# Patient Record
Sex: Female | Born: 1970 | Race: White | Hispanic: No | Smoking: Never smoker
Health system: Southern US, Community
[De-identification: ages and names within clinical notes are randomized; demographics above are authoritative.]

## PROBLEM LIST (undated history)

## (undated) ENCOUNTER — Ambulatory Visit: Discharge status: Absent without leave | Payer: Medicaid Other

## (undated) DIAGNOSIS — F319 Bipolar disorder, unspecified: Secondary | ICD-10-CM

## (undated) DIAGNOSIS — R87629 Unspecified abnormal cytological findings in specimens from vagina: Secondary | ICD-10-CM

## (undated) DIAGNOSIS — F450 Somatization disorder: Secondary | ICD-10-CM

## (undated) DIAGNOSIS — I10 Essential (primary) hypertension: Secondary | ICD-10-CM

## (undated) DIAGNOSIS — K769 Liver disease, unspecified: Secondary | ICD-10-CM

## (undated) DIAGNOSIS — F419 Anxiety disorder, unspecified: Secondary | ICD-10-CM

## (undated) DIAGNOSIS — F22 Delusional disorders: Secondary | ICD-10-CM

## (undated) DIAGNOSIS — N83209 Unspecified ovarian cyst, unspecified side: Secondary | ICD-10-CM

## (undated) DIAGNOSIS — R002 Palpitations: Secondary | ICD-10-CM

## (undated) HISTORY — DX: Bipolar disorder, unspecified: F31.9

## (undated) HISTORY — DX: Palpitations: R00.2

## (undated) HISTORY — DX: Anxiety disorder, unspecified: F41.9

## (undated) HISTORY — DX: Liver disease, unspecified: K76.9

## (undated) HISTORY — PX: UPPER GI ENDOSCOPY: SHX6162

## (undated) HISTORY — DX: Unspecified abnormal cytological findings in specimens from vagina: R87.629

---

## 2001-04-22 ENCOUNTER — Emergency Department (HOSPITAL_COMMUNITY): Admission: EM | Admit: 2001-04-22 | Discharge: 2001-04-22 | Payer: Self-pay | Admitting: *Deleted

## 2001-04-22 ENCOUNTER — Encounter: Payer: Self-pay | Admitting: *Deleted

## 2004-01-03 ENCOUNTER — Emergency Department (HOSPITAL_COMMUNITY): Admission: EM | Admit: 2004-01-03 | Discharge: 2004-01-03 | Payer: Self-pay | Admitting: Emergency Medicine

## 2005-06-04 ENCOUNTER — Emergency Department (HOSPITAL_COMMUNITY): Admission: EM | Admit: 2005-06-04 | Discharge: 2005-06-04 | Payer: Self-pay | Admitting: Emergency Medicine

## 2006-04-22 ENCOUNTER — Emergency Department (HOSPITAL_COMMUNITY): Admission: EM | Admit: 2006-04-22 | Discharge: 2006-04-22 | Payer: Self-pay | Admitting: Emergency Medicine

## 2006-05-30 ENCOUNTER — Emergency Department (HOSPITAL_COMMUNITY): Admission: EM | Admit: 2006-05-30 | Discharge: 2006-05-30 | Payer: Self-pay | Admitting: Emergency Medicine

## 2006-06-01 ENCOUNTER — Emergency Department (HOSPITAL_COMMUNITY): Admission: EM | Admit: 2006-06-01 | Discharge: 2006-06-01 | Payer: Self-pay | Admitting: Emergency Medicine

## 2006-07-02 ENCOUNTER — Ambulatory Visit (HOSPITAL_COMMUNITY): Admission: RE | Admit: 2006-07-02 | Discharge: 2006-07-02 | Payer: Self-pay | Admitting: Pulmonary Disease

## 2006-11-05 ENCOUNTER — Ambulatory Visit (HOSPITAL_COMMUNITY): Admission: RE | Admit: 2006-11-05 | Discharge: 2006-11-05 | Payer: Self-pay | Admitting: Pulmonary Disease

## 2008-03-01 ENCOUNTER — Other Ambulatory Visit: Admission: RE | Admit: 2008-03-01 | Discharge: 2008-03-01 | Payer: Self-pay | Admitting: Obstetrics and Gynecology

## 2008-08-03 ENCOUNTER — Ambulatory Visit (HOSPITAL_COMMUNITY): Admission: RE | Admit: 2008-08-03 | Discharge: 2008-08-03 | Payer: Self-pay | Admitting: Pulmonary Disease

## 2008-10-12 ENCOUNTER — Ambulatory Visit (HOSPITAL_COMMUNITY): Admission: RE | Admit: 2008-10-12 | Discharge: 2008-10-12 | Payer: Self-pay | Admitting: Obstetrics and Gynecology

## 2010-07-15 ENCOUNTER — Ambulatory Visit (HOSPITAL_COMMUNITY): Admission: RE | Admit: 2010-07-15 | Discharge: 2010-07-15 | Payer: Self-pay | Admitting: Obstetrics and Gynecology

## 2010-11-03 ENCOUNTER — Encounter: Payer: Self-pay | Admitting: Orthopaedic Surgery

## 2011-10-02 ENCOUNTER — Emergency Department (HOSPITAL_COMMUNITY): Payer: Medicaid Other

## 2011-10-02 ENCOUNTER — Encounter: Payer: Self-pay | Admitting: Emergency Medicine

## 2011-10-02 ENCOUNTER — Emergency Department (HOSPITAL_COMMUNITY)
Admission: EM | Admit: 2011-10-02 | Discharge: 2011-10-02 | Disposition: A | Discharge status: Home / Self Care | Payer: Medicaid Other | Attending: Emergency Medicine | Admitting: Emergency Medicine

## 2011-10-02 DIAGNOSIS — S1093XA Contusion of unspecified part of neck, initial encounter: Secondary | ICD-10-CM | POA: Insufficient documentation

## 2011-10-02 DIAGNOSIS — Y92009 Unspecified place in unspecified non-institutional (private) residence as the place of occurrence of the external cause: Secondary | ICD-10-CM | POA: Insufficient documentation

## 2011-10-02 DIAGNOSIS — N390 Urinary tract infection, site not specified: Secondary | ICD-10-CM | POA: Insufficient documentation

## 2011-10-02 DIAGNOSIS — R609 Edema, unspecified: Secondary | ICD-10-CM | POA: Insufficient documentation

## 2011-10-02 DIAGNOSIS — R600 Localized edema: Secondary | ICD-10-CM

## 2011-10-02 DIAGNOSIS — S0003XA Contusion of scalp, initial encounter: Secondary | ICD-10-CM | POA: Insufficient documentation

## 2011-10-02 DIAGNOSIS — W540XXA Bitten by dog, initial encounter: Secondary | ICD-10-CM | POA: Insufficient documentation

## 2011-10-02 DIAGNOSIS — Z8679 Personal history of other diseases of the circulatory system: Secondary | ICD-10-CM | POA: Insufficient documentation

## 2011-10-02 HISTORY — DX: Unspecified ovarian cyst, unspecified side: N83.209

## 2011-10-02 LAB — URINALYSIS, ROUTINE W REFLEX MICROSCOPIC
Ketones, ur: 15 mg/dL — AB
Nitrite: NEGATIVE
pH: 6 (ref 5.0–8.0)

## 2011-10-02 LAB — BASIC METABOLIC PANEL
Calcium: 9.2 mg/dL (ref 8.4–10.5)
Chloride: 104 mEq/L (ref 96–112)
Creatinine, Ser: 0.64 mg/dL (ref 0.50–1.10)
GFR calc Af Amer: >90 mL/min (ref >90)
Sodium: 140 mEq/L (ref 135–145)

## 2011-10-02 LAB — CBC
MCHC: 32.1 g/dL (ref 30.0–36.0)
RDW: 14.9 % (ref 11.5–15.5)

## 2011-10-02 LAB — URINE MICROSCOPIC-ADD ON

## 2011-10-02 MED ORDER — NITROFURANTOIN MONOHYD MACRO 100 MG PO CAPS
100.0000 mg | ORAL_CAPSULE | Freq: Once | ORAL | Status: DC
  Filled 2011-10-02: qty 1

## 2011-10-02 MED ORDER — NITROFURANTOIN MONOHYD MACRO 100 MG PO CAPS: 100.0000 mg | ORAL_CAPSULE | Freq: Two times a day (BID) | ORAL | Status: AC

## 2011-10-02 MED ORDER — NITROFURANTOIN MACROCRYSTAL 100 MG PO CAPS
ORAL_CAPSULE | ORAL | Status: AC
  Administered 2011-10-02: 100 mg
  Filled 2011-10-02: qty 1

## 2011-10-02 NOTE — ED Notes (Addendum)
Patient reports dog bite to left side of face yesterday. Small scabbed area noted. Per patient headache, warm feeling, and tremors afterwards with swelling to left leg.
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
## 2011-10-09 ENCOUNTER — Other Ambulatory Visit (HOSPITAL_COMMUNITY): Payer: Self-pay | Admitting: Pulmonary Disease

## 2011-10-09 ENCOUNTER — Ambulatory Visit (HOSPITAL_COMMUNITY)
Admission: RE | Admit: 2011-10-09 | Discharge: 2011-10-09 | Disposition: A | Discharge status: Home / Self Care | Payer: Medicaid Other | Source: Ambulatory Visit | Attending: Pulmonary Disease | Admitting: Pulmonary Disease

## 2011-10-09 DIAGNOSIS — M25569 Pain in unspecified knee: Secondary | ICD-10-CM | POA: Insufficient documentation

## 2011-10-09 DIAGNOSIS — M79606 Pain in leg, unspecified: Secondary | ICD-10-CM

## 2011-10-17 ENCOUNTER — Ambulatory Visit (HOSPITAL_COMMUNITY)
Admission: RE | Admit: 2011-10-17 | Discharge: 2011-10-17 | Disposition: A | Discharge status: Home / Self Care | Payer: Medicaid Other | Source: Ambulatory Visit | Attending: Pulmonary Disease | Admitting: Pulmonary Disease

## 2011-10-17 ENCOUNTER — Other Ambulatory Visit (HOSPITAL_COMMUNITY): Payer: Self-pay | Admitting: Pulmonary Disease

## 2011-10-17 DIAGNOSIS — M7989 Other specified soft tissue disorders: Secondary | ICD-10-CM | POA: Insufficient documentation

## 2011-10-17 DIAGNOSIS — M79609 Pain in unspecified limb: Secondary | ICD-10-CM | POA: Insufficient documentation

## 2011-10-17 DIAGNOSIS — M79605 Pain in left leg: Secondary | ICD-10-CM

## 2011-10-26 ENCOUNTER — Emergency Department (HOSPITAL_COMMUNITY): Payer: Medicaid Other

## 2011-10-26 ENCOUNTER — Emergency Department (HOSPITAL_COMMUNITY)
Admission: EM | Admit: 2011-10-26 | Discharge: 2011-10-26 | Disposition: A | Discharge status: Home / Self Care | Payer: Medicaid Other | Attending: Emergency Medicine | Admitting: Emergency Medicine

## 2011-10-26 ENCOUNTER — Encounter (HOSPITAL_COMMUNITY): Payer: Self-pay | Admitting: *Deleted

## 2011-10-26 DIAGNOSIS — N390 Urinary tract infection, site not specified: Secondary | ICD-10-CM | POA: Insufficient documentation

## 2011-10-26 DIAGNOSIS — R220 Localized swelling, mass and lump, head: Secondary | ICD-10-CM | POA: Insufficient documentation

## 2011-10-26 DIAGNOSIS — R51 Headache: Secondary | ICD-10-CM | POA: Insufficient documentation

## 2011-10-26 DIAGNOSIS — H9209 Otalgia, unspecified ear: Secondary | ICD-10-CM | POA: Insufficient documentation

## 2011-10-26 DIAGNOSIS — Z8673 Personal history of transient ischemic attack (TIA), and cerebral infarction without residual deficits: Secondary | ICD-10-CM | POA: Insufficient documentation

## 2011-10-26 LAB — URINALYSIS, ROUTINE W REFLEX MICROSCOPIC
Glucose, UA: NEGATIVE mg/dL
Ketones, ur: 40 mg/dL — AB
Protein, ur: NEGATIVE mg/dL

## 2011-10-26 LAB — SEDIMENTATION RATE: Sed Rate: 36 mm/hr — ABNORMAL HIGH (ref 0–22)

## 2011-10-26 LAB — CBC
MCH: 25.6 pg — ABNORMAL LOW (ref 26.0–34.0)
MCV: 79.9 fL (ref 78.0–100.0)
Platelets: 256 10*3/uL (ref 150–400)
RDW: 14.9 % (ref 11.5–15.5)

## 2011-10-26 LAB — BASIC METABOLIC PANEL
Calcium: 9.6 mg/dL (ref 8.4–10.5)
GFR calc non Af Amer: >90 mL/min (ref >90)
Glucose, Bld: 104 mg/dL — ABNORMAL HIGH (ref 70–99)
Sodium: 137 mEq/L (ref 135–145)

## 2011-10-26 LAB — DIFFERENTIAL
Basophils Absolute: 0 10*3/uL (ref 0.0–0.1)
Eosinophils Absolute: 0 10*3/uL (ref 0.0–0.7)
Eosinophils Relative: 0 % (ref 0–5)

## 2011-10-26 LAB — URINE MICROSCOPIC-ADD ON

## 2011-10-26 MED ORDER — IBUPROFEN 800 MG PO TABS
800.0000 mg | ORAL_TABLET | Freq: Once | ORAL | Status: AC
  Administered 2011-10-26: 800 mg via ORAL
  Filled 2011-10-26: qty 1

## 2011-10-26 MED ORDER — LORAZEPAM 1 MG PO TABS
1.0000 mg | ORAL_TABLET | Freq: Once | ORAL | Status: AC
  Administered 2011-10-26: 1 mg via ORAL
  Filled 2011-10-26: qty 1

## 2011-10-26 MED ORDER — CEPHALEXIN 500 MG PO CAPS: 500.0000 mg | ORAL_CAPSULE | Freq: Four times a day (QID) | ORAL | Status: AC

## 2011-10-26 NOTE — ED Notes (Addendum)
Pt c/o swelling to right side of face since she got bitten by a dog on dec. 18, pt c/o bruising from taking Macrobid, pt states that she has pain to right side of temple area and hears a swishing sound when she gets up, pt states that the swelling and pain she has had since the dog bite, the swishing is a new symptom

## 2011-10-26 NOTE — ED Provider Notes (Signed)
History  This chart was scribed for Glynn Octave, MD by Bennett Scrape. This patient was seen in room APA08/APA08 and the patient's care was started at 5:58PM.  CSN: [id number]  Arrival date & time 10/26/11  1624   First MD Initiated Contact with Patient 10/26/11 1743      Chief Complaint  Patient presents with  . Headache   The history is provided by the patient. No language interpreter was used.    Kayla [name] is a 41 y.o. female who presents to the Emergency Department complaining of 2 weeks of gradual onset, gradually worsening, constant right sided HA with associated right-sided facial swelling. She also c/o 2 days of occasional otalgia of the right ear describes as a swishing sound. She denies any modifying factors. She has not taken any medication at home to improve symptoms. She denies double vision, nausea, vomiting and chest pain as associated symptoms. She states that she was bit by dog in December and feels that sh might be having some delayed reaction to the bite.  She has a h/o panic attacks and bilateral leg edema.  She denies smoking and alcohol use.  Pt's PCP is Dr. Juanetta Gosling.  Past Medical History  Diagnosis Date  . Ovarian cyst   . Polyp cervix   . Panic attack   . TIA (transient ischemic attack)   . Edema     History reviewed. No pertinent past surgical history.  Family History  Problem Relation Age of Onset  . Cancer Father   . Heart failure Other   . Cancer Other     History  Substance Use Topics  . Smoking status: Never Smoker   . Smokeless tobacco: Never Used  . Alcohol Use: No    OB History    Grav Para Term Preterm Abortions TAB SAB Ect Mult Living   3 3 3       3       Review of Systems  A complete 10 system review of systems was obtained and is otherwise negative except as noted in the HPI.   Allergies  Review of patient's allergies indicates no known allergies.  Home Medications   Current Outpatient Rx  Name Route Sig  Dispense Refill  . FUROSEMIDE 40 MG PO TABS Oral Take 40 mg by mouth daily.      . ADULT MULTIVITAMIN W/MINERALS CH Oral Take 1 tablet by mouth daily.      . CEPHALEXIN 500 MG PO CAPS Oral Take 1 capsule (500 mg total) by mouth 4 (four) times daily. 40 capsule 0    Triage Vitals: BP 144/100  Pulse 116  Temp(Src) 98.9 F (37.2 C) (Oral)  Resp 20  Ht 5\' 4"  (1.626 m)  Wt 210 lb (95.255 kg)  BMI 36.05 kg/m2  SpO2 99%  LMP 10/13/2011  Physical Exam  Nursing note and vitals reviewed. Constitutional: She is oriented to person, place, and time. She appears well-developed and well-nourished.  HENT:  Head: Normocephalic and atraumatic.       No visible swelling, no artery tenderness  Eyes: Conjunctivae and EOM are normal.  Neck: Normal range of motion. Neck supple.  Cardiovascular: Normal rate, regular rhythm and normal heart sounds.   Pulmonary/Chest: Effort normal and breath sounds normal. No respiratory distress.  Abdominal: Soft. There is no tenderness.  Musculoskeletal: She exhibits edema (symmetric leg swelling).       5 strength throughtout  Neurological: She is alert and oriented to person, place, and time. No cranial  nerve deficit.  Skin: Skin is warm and dry. No rash noted.  Psychiatric: She has a normal mood and affect. Her behavior is normal.    ED Course  Procedures (including critical care time)  DIAGNOSTIC STUDIES: Oxygen Saturation is 99% on room air, normal by my interpretation.    COORDINATION OF CARE: 6:04PM-Discussed treatment plan with pt at bedside and pt agreed to plan. 9:18PM-Discussed negative CT results with pt and pt acknowledged. Will prescribe antibiotics for the urine infections. Advised pt to follow-up with PCP within the next week.  Labs Reviewed  CBC - Abnormal; Notable for the following:    Hemoglobin 11.7 (*)    MCH 25.6 (*)    All other components within normal limits  BASIC METABOLIC PANEL - Abnormal; Notable for the following:    Glucose,  Bld 104 (*)    All other components within normal limits  SEDIMENTATION RATE - Abnormal; Notable for the following:    Sed Rate 36 (*)    All other components within normal limits  URINALYSIS, ROUTINE W REFLEX MICROSCOPIC - Abnormal; Notable for the following:    APPearance CLOUDY (*)    Specific Gravity, Urine >1.030 (*)    Bilirubin Urine SMALL (*)    Ketones, ur 40 (*)    Leukocytes, UA SMALL (*)    All other components within normal limits  URINE MICROSCOPIC-ADD ON - Abnormal; Notable for the following:    Squamous Epithelial / LPF MANY (*)    Bacteria, UA MANY (*)    All other components within normal limits  DIFFERENTIAL  PREGNANCY, URINE   Ct Head Wo Contrast  10/26/2011  *RADIOLOGY REPORT*  Clinical Data:  Headache  CT HEAD WITHOUT CONTRAST  Technique:  Contiguous axial images were obtained from the base of the skull through the vertex without contrast  Comparison:  07/02/2006  Findings:  The brain has a normal appearance without evidence for hemorrhage, acute infarction, hydrocephalus, or mass lesion.  There is no extra axial fluid collection.  The skull and paranasal sinuses are normal.  IMPRESSION: Normal CT of the head without contrast.  Original Report Authenticated By: Judie Petit. Ruel Favors, M.D.     1. Headache   2. Urinary tract infection       MDM  Difficult historian presenting with right-sided headache is beginning progressively worse over the past 2 weeks associated with subjective right-sided facial swelling. Intermittent right-sided ear pain.  No trouble swallowing, tongue elevation, trismus, dental injury.  Normal neurological exam.  Given age and lack of temporal artery tenderness low suspicion for temporal arteritis. CT neg.  Headache controlled.  Normal neuro exam.  "swelling" patient referring to is not appreciable.    I personally performed the services described in this documentation, which was scribed in my presence.  The recorded information has been  reviewed and considered.    Glynn Octave, MD 10/27/11 [phone]

## 2011-10-26 NOTE — ED Notes (Signed)
Report received from gary, rn.

## 2011-11-06 ENCOUNTER — Other Ambulatory Visit (HOSPITAL_COMMUNITY): Payer: Self-pay | Admitting: Pulmonary Disease

## 2011-11-06 DIAGNOSIS — R609 Edema, unspecified: Secondary | ICD-10-CM

## 2011-11-07 ENCOUNTER — Ambulatory Visit (HOSPITAL_COMMUNITY): Payer: Medicaid Other | Attending: Pulmonary Disease

## 2011-11-14 ENCOUNTER — Ambulatory Visit (HOSPITAL_COMMUNITY)
Admission: RE | Admit: 2011-11-14 | Discharge: 2011-11-14 | Disposition: A | Discharge status: Home / Self Care | Payer: Medicaid Other | Source: Ambulatory Visit | Attending: Pulmonary Disease | Admitting: Pulmonary Disease

## 2011-11-14 DIAGNOSIS — R220 Localized swelling, mass and lump, head: Secondary | ICD-10-CM | POA: Insufficient documentation

## 2011-11-14 DIAGNOSIS — R609 Edema, unspecified: Secondary | ICD-10-CM

## 2011-11-14 DIAGNOSIS — R221 Localized swelling, mass and lump, neck: Secondary | ICD-10-CM | POA: Insufficient documentation

## 2011-11-21 ENCOUNTER — Other Ambulatory Visit: Payer: Self-pay

## 2011-11-21 ENCOUNTER — Encounter (HOSPITAL_COMMUNITY): Payer: Self-pay

## 2011-11-21 ENCOUNTER — Emergency Department (HOSPITAL_COMMUNITY)
Admission: EM | Admit: 2011-11-21 | Discharge: 2011-11-21 | Disposition: A | Discharge status: Home / Self Care | Payer: Medicaid Other | Attending: Emergency Medicine | Admitting: Emergency Medicine

## 2011-11-21 DIAGNOSIS — R5381 Other malaise: Secondary | ICD-10-CM | POA: Insufficient documentation

## 2011-11-21 DIAGNOSIS — R110 Nausea: Secondary | ICD-10-CM | POA: Insufficient documentation

## 2011-11-21 DIAGNOSIS — R531 Weakness: Secondary | ICD-10-CM

## 2011-11-21 DIAGNOSIS — Z8673 Personal history of transient ischemic attack (TIA), and cerebral infarction without residual deficits: Secondary | ICD-10-CM | POA: Insufficient documentation

## 2011-11-21 LAB — BASIC METABOLIC PANEL
CO2: 24 mEq/L (ref 19–32)
Chloride: 102 mEq/L (ref 96–112)
GFR calc Af Amer: >90 mL/min (ref >90)
Potassium: 3.9 mEq/L (ref 3.5–5.1)
Sodium: 137 mEq/L (ref 135–145)

## 2011-11-21 LAB — URINALYSIS, ROUTINE W REFLEX MICROSCOPIC
Glucose, UA: NEGATIVE mg/dL
Ketones, ur: >80 mg/dL — AB
Nitrite: NEGATIVE
pH: 6 (ref 5.0–8.0)

## 2011-11-21 LAB — CBC
HCT: 35.9 % — ABNORMAL LOW (ref 36.0–46.0)
MCHC: 32 g/dL (ref 30.0–36.0)
RDW: 15.3 % (ref 11.5–15.5)
WBC: 6.8 10*3/uL (ref 4.0–10.5)

## 2011-11-21 LAB — DIFFERENTIAL
Basophils Absolute: 0 10*3/uL (ref 0.0–0.1)
Basophils Relative: 0 % (ref 0–1)
Lymphocytes Relative: 11 % — ABNORMAL LOW (ref 12–46)
Monocytes Absolute: 0.6 10*3/uL (ref 0.1–1.0)
Neutro Abs: 5.4 10*3/uL (ref 1.7–7.7)
Neutrophils Relative %: 80 % — ABNORMAL HIGH (ref 43–77)

## 2011-11-21 LAB — URINE MICROSCOPIC-ADD ON

## 2011-11-21 LAB — POCT PREGNANCY, URINE: Preg Test, Ur: NEGATIVE

## 2011-11-21 NOTE — ED Notes (Signed)
Pt presents with "knots all over my body", "I feel weak", and "Im loosing weight". Pt states pain comes and in same area of pain she notices a new "knot". Pt states she has also been loosing a lot of weight. Pt states in December she was bit by a dog and then started having multiple problems after that.
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
## 2011-11-21 NOTE — ED Notes (Signed)
Pt reports for the past 2 months has been having knots all over her body under her skin.  Reports will have a pain and then can feel a knot under her skin.  Reports her father died of cancer and she is worried that she may have cancer.  Pt  Tearful, reports has been very worried about these knots and hasn't been eating a lot.  Reports generalized weakness.  Pt says she saw her doctor and he told her he would refer her to a dermatologist but says hasn't had the appt yet.  Also reports her urine has been orange x 2 months.   Reports was diagnosed with UTI and took antibiotics but says urine is still orange.  Pt says is unable to give urine sample at this time.

## 2011-11-21 NOTE — ED Notes (Signed)
Pt appears to be upset and tearful.

## 2011-11-27 ENCOUNTER — Ambulatory Visit: Payer: Medicaid Other | Admitting: Orthopedic Surgery

## 2011-11-27 ENCOUNTER — Telehealth: Payer: Self-pay | Admitting: Orthopedic Surgery

## 2011-11-27 NOTE — Telephone Encounter (Signed)
Kayla [name] was scheduled for a 30 minute appointment with Dr. Romeo Apple 11/27/11.  On 11/26/11 patient was contacted about the appointment and Confirmed that she would be here for the 9:00 appointment.  A message was left at 4:14 AM that she could not come for this appointment And would need to be rescheduled for an afternoon appointment.  This is a referral from Dr. Juanetta Gosling, so I called Gardiner Rhyme this morning to advise her of the message left by the patient.  Appointment has not been rescheduled yet as she will need extra time and we will need to discuss with Dr. Romeo Apple

## 2011-11-28 ENCOUNTER — Encounter: Payer: Self-pay | Admitting: Cardiology

## 2011-12-01 ENCOUNTER — Encounter: Payer: Self-pay | Admitting: Cardiology

## 2011-12-01 ENCOUNTER — Ambulatory Visit (INDEPENDENT_AMBULATORY_CARE_PROVIDER_SITE_OTHER): Payer: Medicaid Other | Admitting: Cardiology

## 2011-12-01 VITALS — BP 134/93 | HR 110 | Resp 16 | Ht 64.0 in | Wt 191.0 lb

## 2011-12-01 DIAGNOSIS — N83209 Unspecified ovarian cyst, unspecified side: Secondary | ICD-10-CM | POA: Insufficient documentation

## 2011-12-01 DIAGNOSIS — Z0189 Encounter for other specified special examinations: Secondary | ICD-10-CM | POA: Insufficient documentation

## 2011-12-01 DIAGNOSIS — F419 Anxiety disorder, unspecified: Secondary | ICD-10-CM | POA: Insufficient documentation

## 2011-12-01 DIAGNOSIS — R002 Palpitations: Secondary | ICD-10-CM

## 2011-12-01 DIAGNOSIS — F411 Generalized anxiety disorder: Secondary | ICD-10-CM

## 2011-12-01 NOTE — Assessment & Plan Note (Addendum)
Tachypalpitations are most likely related to anxiety, but certainly could be manifestations of an arrhythmia.  Her tendency to experience symptoms when she is lying down in a quiet room is not suggestive of a paroxysmal tachycardia.  Since she experiences symptoms everyday, Holter monitoring should be adequate to rule out any significant arrhythmia.  TSH level will be obtained.  I doubt that she will require specific cardiac testing beyond a Holter or pharmacologic treatment.

## 2011-12-01 NOTE — Patient Instructions (Addendum)
Your physician recommends that you schedule a follow-up appointment in: 2 weeks  Your physician has recommended that you wear a holter monitor. Holter monitors are medical devices that record the heart's electrical activity. Doctors most often use these monitors to diagnose arrhythmias. Arrhythmias are problems with the speed or rhythm of the heartbeat. The monitor is a small, portable device. You can wear one while you do your normal daily activities. This is usually used to diagnose what is causing palpitations/syncope (passing out).  Your physician recommends that you return for lab work in: Today

## 2011-12-01 NOTE — Progress Notes (Signed)
Patient ID: Kayla [name], female   DOB: 04-12-1971, 41 y.o.   MRN: [id number] HPI: Initial cardiology evaluation performed at the kind request of Dr. Juanetta Gosling for evaluation of palpitations.  Ms. Mccutcheon has no history of cardiac disease, has not undergone any significant cardiac testing nor has she previously been evaluated by a cardiologist.  She has had multiple somatic complaints in recent weeks including discoloration of her urine, subcutaneous nodules, skin lesions, left thigh pain and palpitations.  She reports chest discomfort, but as best I can tell she is experiencing a feeling of her heart beating rapidly and/or abnormally.  She's had no lightheadedness and no syncope.  Current Outpatient Prescriptions on File Prior to Visit  Medication Sig Dispense Refill  . furosemide (LASIX) 40 MG tablet Take 40 mg by mouth daily.        . Multiple Vitamin (MULITIVITAMIN WITH MINERALS) TABS Take 1 tablet by mouth daily.         No Known Allergies    Past Medical History  Diagnosis Date  . Ovarian cyst     cervical polyp  . Anxiety     Panic attacks; somatization  . Palpitations     possible bradycardia     Past Surgical History  Procedure Date  . None     Family History  Problem Relation Age of Onset  . Cancer Father   . Heart failure Other   . Hypertension Mother   . Diabetes Mother     History   Social History  . Marital Status: Divorced    Spouse Name: N/A    Number of Children: N/A  . Years of Education: N/A   Occupational History  . Not on file.   Social History Main Topics  . Smoking status: Never Smoker   . Smokeless tobacco: Never Used  . Alcohol Use: No  . Drug Use: No  . Sexually Active: Not Currently   Other Topics Concern  . Not on file   Social History Narrative  . No narrative on file    ROS:  Intermittent headaches; recent history of otitis media; class II exertional dyspnea; decreased appetite; normal menses; intermittent pedal edema; anxiety.    All  other systems reviewed and are negative.  PHYSICAL EXAM: BP 134/93  Pulse 110  Resp 16  Ht 5\' 4"  (1.626 m)  Wt 86.637 kg (191 lb)  BMI 32.79 kg/m2  LMP 10/31/2011  General-Well-developed; no acute distress Body Habitus-moderately overweight HEENT-[place]/AT; PERRL; EOM intact; conjunctiva and lids nl Neck-No JVD; no carotid bruits Endocrine-No thyromegaly Lungs-Clear lung fields; resonant percussion; normal I-to-E ratio Cardiovascular- normal PMI; normal S1 and S2; mild tachycardia Abdomen-BS normal; soft and non-tender without masses or organomegaly Musculoskeletal-No deformities, cyanosis or clubbing Neurologic-Nl cranial nerves; symmetric strength and tone Skin- Warm, no significant lesions Extremities-Nl distal pulses; no edema  EKG:  Tracing obtained 11/21/11 reviewed.  Normal sinus rhythm, sinus arrhythmia, otherwise normal.  No previous tracing for comparison.  ASSESSMENT AND PLAN:  [place] Bing, MD 12/01/2011 2:52 PM

## 2011-12-01 NOTE — Assessment & Plan Note (Signed)
Although a complete social and psychological history were beyond the scope of this consultation, patient does appear to have significant behavioral health issues that are most certainly impacting her multiple complaints.  I doubt that we will make much headway in addressing her concerns without expert psychologic assistance.

## 2011-12-02 ENCOUNTER — Ambulatory Visit (HOSPITAL_COMMUNITY)
Admission: RE | Admit: 2011-12-02 | Discharge: 2011-12-02 | Disposition: A | Discharge status: Home / Self Care | Payer: Medicaid Other | Source: Ambulatory Visit | Attending: Cardiology | Admitting: Cardiology

## 2011-12-02 DIAGNOSIS — R002 Palpitations: Secondary | ICD-10-CM | POA: Insufficient documentation

## 2011-12-02 NOTE — Progress Notes (Signed)
*  PRELIMINARY RESULTS* Echocardiogram 48H Holter monitor has been performed.  Conrad Roland 12/02/2011, 2:15 PM

## 2011-12-04 ENCOUNTER — Other Ambulatory Visit: Payer: Self-pay | Admitting: Cardiology

## 2011-12-04 DIAGNOSIS — R002 Palpitations: Secondary | ICD-10-CM

## 2011-12-10 ENCOUNTER — Ambulatory Visit (INDEPENDENT_AMBULATORY_CARE_PROVIDER_SITE_OTHER): Payer: Medicaid Other | Admitting: Orthopedic Surgery

## 2011-12-10 ENCOUNTER — Encounter: Payer: Self-pay | Admitting: Orthopedic Surgery

## 2011-12-10 ENCOUNTER — Other Ambulatory Visit: Payer: Self-pay | Admitting: Orthopedic Surgery

## 2011-12-10 ENCOUNTER — Ambulatory Visit (HOSPITAL_COMMUNITY)
Admission: RE | Admit: 2011-12-10 | Discharge: 2011-12-10 | Disposition: A | Discharge status: Home / Self Care | Payer: Medicaid Other | Source: Ambulatory Visit | Attending: Orthopedic Surgery | Admitting: Orthopedic Surgery

## 2011-12-10 VITALS — BP 108/60 | Ht 64.0 in | Wt 191.0 lb

## 2011-12-10 DIAGNOSIS — M79609 Pain in unspecified limb: Secondary | ICD-10-CM | POA: Insufficient documentation

## 2011-12-10 DIAGNOSIS — M25459 Effusion, unspecified hip: Secondary | ICD-10-CM

## 2011-12-10 DIAGNOSIS — T148XXA Other injury of unspecified body region, initial encounter: Secondary | ICD-10-CM

## 2011-12-10 NOTE — Patient Instructions (Signed)
You will be scheduled for [place] thigh

## 2011-12-11 ENCOUNTER — Encounter: Payer: Self-pay | Admitting: Orthopedic Surgery

## 2011-12-11 ENCOUNTER — Telehealth: Payer: Self-pay | Admitting: Radiology

## 2011-12-11 DIAGNOSIS — M25459 Effusion, unspecified hip: Secondary | ICD-10-CM | POA: Insufficient documentation

## 2011-12-11 DIAGNOSIS — T148XXA Other injury of unspecified body region, initial encounter: Secondary | ICD-10-CM | POA: Insufficient documentation

## 2011-12-11 NOTE — Progress Notes (Signed)
  Subjective:    Kayla [name] is a 41 y.o. female who presents is a consultation from Dr. Shaune Pollack.  The patient has had swelling in her LEFT leg and thigh for 3 months after hitting it on the arm of a couch.  She complains of dull 7/10 intermittent pain with swelling.  She insists that the LEFT thigh is larger than the RIGHT.  She had an MRI of the same thigh 3-4 years ago for knots that she felt in the thigh.  This proved to be normal.  She denies numbness tingling locking catching.  Her symptoms are more serious or severe at night.  It does not affect her walking.   The following portions of the patient's history were reviewed and updated as appropriate: allergies, current medications, past family history, past medical history, past social history, past surgical history and problem list.   Review of Systems A comprehensive review of systems was negative except for: her appetite expected weight loss chills fatigue palpitations seen by heart physician, last month had some shortness of breath.  Nausea.  She complains that her urine is aren't red.  Teaching.  Excessive thirst.  Seasonal ALLERGIES.  Her primary care physician is evaluating these other problems and has asked me to specifically evaluate her LEFT thigh.   Objective:    BP 108/60  Ht 5\' 4"  (1.626 m)  Wt 86.637 kg (191 lb)  BMI 32.79 kg/m2  LMP 10/31/2011 Vital signs are stable as recorded  General appearance is normal  The patient is alert and oriented x3  The patient's mood and affect are normal  Gait assessment: normal  The cardiovascular exam reveals normal pulses and temperature without edema swelling.  The lymphatic system is negative for palpable lymph nodes  The sensory exam is normal.  There are no pathologic reflexes.  Balance is normal.  Right knee: normal and no effusion, full active range of motion, no joint line tenderness, ligamentous structures intact.  Left knee:  normal and no effusion, full  active range of motion, no joint line tenderness, ligamentous structures intact., her LEFT thigh was tender.   X-ray x-rays were previously done the hospital included a femur and that was normal.  Assessment:    Left thigh hematoma.    Plan:    recommend ultrasound.  Ultrasound was negative.

## 2011-12-11 NOTE — Telephone Encounter (Signed)
I called to get a precert from insurance for patient for her ultrasound that she had yesterday at [facility] ([facility] She has Medicaid, authorization [phone].

## 2011-12-12 ENCOUNTER — Telehealth: Payer: Self-pay | Admitting: Cardiology

## 2011-12-12 ENCOUNTER — Telehealth: Payer: Self-pay | Admitting: Orthopedic Surgery

## 2011-12-12 NOTE — Telephone Encounter (Signed)
Leland Her left a message asking about the [place] results.  York Spaniel the girl at the hospital told her she did not see anything. Asking if you will call her and tell her what you are going to do next. Her # [phone]

## 2011-12-12 NOTE — Telephone Encounter (Signed)
Patient notified of results and encouraged to reschedule appt, however was not willing to to at this time.

## 2011-12-12 NOTE — Telephone Encounter (Signed)
PT WANTS LAB RESULTS SHE CANCELED 2 WEEK F/U/TMJ

## 2011-12-15 ENCOUNTER — Ambulatory Visit: Payer: Medicaid Other | Admitting: Adult Health

## 2011-12-16 ENCOUNTER — Other Ambulatory Visit (HOSPITAL_COMMUNITY): Payer: Self-pay | Admitting: Urology

## 2011-12-16 DIAGNOSIS — N390 Urinary tract infection, site not specified: Secondary | ICD-10-CM

## 2011-12-17 NOTE — Telephone Encounter (Signed)
Nothing is next Ultrasound was normal   Call primary care doctor for further care   We are finished with treatment

## 2011-12-17 NOTE — Telephone Encounter (Signed)
12/17/11 Patient called back - please advise of CT results if not yet reached.  Please call PH# [phone]. She also relays that her left leg is swollen "back of leg" and that the front of the leg is puffy.  She is asking what is next.

## 2011-12-18 ENCOUNTER — Ambulatory Visit (HOSPITAL_COMMUNITY): Admission: RE | Admit: 2011-12-18 | Payer: Medicaid Other | Source: Ambulatory Visit

## 2011-12-18 NOTE — Telephone Encounter (Signed)
Jamie,routing to you to  call the patient.  Thanks

## 2011-12-19 NOTE — Telephone Encounter (Signed)
Patient aware.

## 2011-12-20 ENCOUNTER — Encounter (HOSPITAL_COMMUNITY): Payer: Self-pay | Admitting: *Deleted

## 2011-12-20 ENCOUNTER — Emergency Department (HOSPITAL_COMMUNITY)
Admission: EM | Admit: 2011-12-20 | Discharge: 2011-12-20 | Disposition: A | Discharge status: Home / Self Care | Payer: Medicaid Other | Attending: Emergency Medicine | Admitting: Emergency Medicine

## 2011-12-20 DIAGNOSIS — J029 Acute pharyngitis, unspecified: Secondary | ICD-10-CM | POA: Insufficient documentation

## 2011-12-20 DIAGNOSIS — B9789 Other viral agents as the cause of diseases classified elsewhere: Secondary | ICD-10-CM | POA: Insufficient documentation

## 2011-12-20 DIAGNOSIS — B349 Viral infection, unspecified: Secondary | ICD-10-CM

## 2011-12-20 DIAGNOSIS — H9209 Otalgia, unspecified ear: Secondary | ICD-10-CM | POA: Insufficient documentation

## 2011-12-20 NOTE — Discharge Instructions (Signed)
You have no signs of strep throat, or middle ear infection.  Use ibuprofen for pain.  Use throat lozenges, to severe throat.  Follow up with your doctor as needed

## 2011-12-20 NOTE — ED Notes (Signed)
Pt c/o sore throat x 2-3 months and bilateral ear pain. Pt also c/o pain to the back, right side of her ribs. Pt also c/o right hand numbness.

## 2011-12-20 NOTE — ED Notes (Signed)
Pt alert & oriented x4, stable gait. Pt given discharge instructions, paperwork. Patient instructed to stop at the registration desk to finish any additional paperwork. pt verbalized understanding. Pt left department w/ no further questions.  

## 2011-12-20 NOTE — ED Provider Notes (Signed)
History     CSN: [id number]  Arrival date & time 12/20/11  0221   First MD Initiated Contact with Patient 12/20/11 [phone]      Chief Complaint  Patient presents with  . Sore Throat  . Otalgia    (Consider location/radiation/quality/duration/timing/severity/associated sxs/prior treatment) Patient is a 41 y.o. female presenting with pharyngitis and ear pain. The history is provided by the patient and medical records.  Sore Throat Pertinent negatives include no headaches.  Otalgia Associated symptoms include sore throat. Pertinent negatives include no headaches.  the patient is a 41 year old, female, who complains of a sore throat, and earache for 3 or 4 months.  She denies fever.  She says her throat feels raw is if your sawdust in it.  She used her son's antibiotics for her ears.  She denies nausea, vomiting, fevers, chills, cough.  She has no other symptoms.  Past Medical History  Diagnosis Date  . Ovarian cyst     cervical polyp  . Anxiety     Panic attacks; somatization  . Palpitations     possible bradycardia    Past Surgical History  Procedure Date  . None     Family History  Problem Relation Age of Onset  . Cancer Father   . Heart failure Other   . Hypertension Mother   . Diabetes Mother     History  Substance Use Topics  . Smoking status: Never Smoker   . Smokeless tobacco: Never Used  . Alcohol Use: No    OB History    Grav Para Term Preterm Abortions TAB SAB Ect Mult Living   3 3 3       3       Review of Systems  Constitutional: Negative for fever and chills.  HENT: Positive for ear pain and sore throat.   Neurological: Negative for headaches.  Psychiatric/Behavioral: Negative for confusion.  All other systems reviewed and are negative.    Allergies  Review of patient's allergies indicates no known allergies.  Home Medications   Current Outpatient Rx  Name Route Sig Dispense Refill  . FUROSEMIDE 40 MG PO TABS Oral Take 40 mg by mouth  daily.      . ADULT MULTIVITAMIN W/MINERALS CH Oral Take 1 tablet by mouth daily.        BP 109/77  Pulse 121  Temp(Src) 97.6 F (36.4 C) (Oral)  Resp 20  Ht 5\' 4"  (1.626 m)  Wt 181 lb (82.101 kg)  BMI 31.07 kg/m2  SpO2 99%  LMP 12/01/2011  Physical Exam  Constitutional: She is oriented to person, place, and time. She appears well-developed and well-nourished.  HENT:  Head: Normocephalic and atraumatic.  Right Ear: External ear normal.  Left Ear: External ear normal.  Mouth/Throat: Oropharynx is clear and moist. No oropharyngeal exudate.  Eyes: Conjunctivae are normal. Pupils are equal, round, and reactive to light.  Neck: Normal range of motion. Neck supple.  Pulmonary/Chest: Effort normal.  Musculoskeletal: Normal range of motion.  Neurological: She is alert and oriented to person, place, and time.  Skin: Skin is warm and dry.  Psychiatric: She has a normal mood and affect.    ED Course  Procedures (including critical care time) Sore throat, and otalgia for greater than 3 months.  No signs of systemic illness.  No tests indicated.  Labs Reviewed - No data to display No results found.   No diagnosis found.    MDM  Sore throat, otalgia, with no signs of systemic  illness        Cheri Guppy, MD 12/20/11 [phone]

## 2011-12-22 ENCOUNTER — Telehealth: Payer: Self-pay | Admitting: Cardiology

## 2011-12-22 NOTE — Telephone Encounter (Signed)
Patient has cancelled follow up appointment and has been aware that she will need to make an appointment to be seen for her issues and for discussion regarding 48 hour monitor.  States that she will call back.

## 2011-12-22 NOTE — Telephone Encounter (Signed)
PT LEFT MESSAGE ON 12/19/11 AT 7:47PM THAT HER LEFT LEG IS SWOLLEN AND SHE CAN NOT SLEEP "ITS LIKE SHE CLOSES HER EYE BUT NEVER GOES TO SLEEP, HER HANDS GO NUMB.  AT END OF MESSAGE SHE STATED SHE NEEDS THE RESULTS FROM HER HEART MONITOR.

## 2011-12-27 ENCOUNTER — Emergency Department (HOSPITAL_COMMUNITY): Payer: Medicaid Other

## 2011-12-27 ENCOUNTER — Other Ambulatory Visit: Payer: Self-pay

## 2011-12-27 ENCOUNTER — Inpatient Hospital Stay (HOSPITAL_COMMUNITY)
Admission: EM | Admit: 2011-12-27 | Discharge: 2011-12-29 | DRG: 641 | Disposition: A | Discharge status: Home / Self Care | Payer: Medicaid Other | Attending: Pulmonary Disease | Admitting: Pulmonary Disease

## 2011-12-27 ENCOUNTER — Encounter (HOSPITAL_COMMUNITY): Payer: Self-pay | Admitting: *Deleted

## 2011-12-27 DIAGNOSIS — R112 Nausea with vomiting, unspecified: Secondary | ICD-10-CM | POA: Diagnosis present

## 2011-12-27 DIAGNOSIS — E876 Hypokalemia: Principal | ICD-10-CM

## 2011-12-27 DIAGNOSIS — R7309 Other abnormal glucose: Secondary | ICD-10-CM | POA: Diagnosis present

## 2011-12-27 DIAGNOSIS — E860 Dehydration: Secondary | ICD-10-CM

## 2011-12-27 DIAGNOSIS — F411 Generalized anxiety disorder: Secondary | ICD-10-CM | POA: Diagnosis present

## 2011-12-27 DIAGNOSIS — L98499 Non-pressure chronic ulcer of skin of other sites with unspecified severity: Secondary | ICD-10-CM | POA: Diagnosis present

## 2011-12-27 DIAGNOSIS — R21 Rash and other nonspecific skin eruption: Secondary | ICD-10-CM | POA: Diagnosis present

## 2011-12-27 DIAGNOSIS — R634 Abnormal weight loss: Secondary | ICD-10-CM | POA: Diagnosis present

## 2011-12-27 LAB — CBC
HCT: 41.4 % (ref 36.0–46.0)
RBC: 5.36 MIL/uL — ABNORMAL HIGH (ref 3.87–5.11)
RDW: 17.6 % — ABNORMAL HIGH (ref 11.5–15.5)
WBC: 6.3 10*3/uL (ref 4.0–10.5)

## 2011-12-27 LAB — COMPREHENSIVE METABOLIC PANEL
ALT: 60 U/L — ABNORMAL HIGH (ref 0–35)
AST: 40 U/L — ABNORMAL HIGH (ref 0–37)
CO2: 33 mEq/L — ABNORMAL HIGH (ref 19–32)
Chloride: 88 mEq/L — ABNORMAL LOW (ref 96–112)
Creatinine, Ser: 0.71 mg/dL (ref 0.50–1.10)
GFR calc non Af Amer: >90 mL/min (ref >90)
Glucose, Bld: 137 mg/dL — ABNORMAL HIGH (ref 70–99)
Sodium: 136 mEq/L (ref 135–145)
Total Bilirubin: 0.5 mg/dL (ref 0.3–1.2)

## 2011-12-27 LAB — URINALYSIS, ROUTINE W REFLEX MICROSCOPIC
Glucose, UA: NEGATIVE mg/dL
Ketones, ur: 40 mg/dL — AB
Protein, ur: 30 mg/dL — AB
Urobilinogen, UA: 0.2 mg/dL (ref 0.0–1.0)

## 2011-12-27 LAB — DIFFERENTIAL
Basophils Absolute: 0 10*3/uL (ref 0.0–0.1)
Lymphocytes Relative: 17 % (ref 12–46)
Lymphs Abs: 1 10*3/uL (ref 0.7–4.0)
Monocytes Absolute: 0.9 10*3/uL (ref 0.1–1.0)
Neutro Abs: 4.3 10*3/uL (ref 1.7–7.7)

## 2011-12-27 LAB — URINE MICROSCOPIC-ADD ON

## 2011-12-27 MED ORDER — TRIAMCINOLONE ACETONIDE 0.1 % EX CREA
TOPICAL_CREAM | Freq: Two times a day (BID) | CUTANEOUS | Status: DC
  Administered 2011-12-27 – 2011-12-28 (×3): via TOPICAL
  Administered 2011-12-29: 1 via TOPICAL
  Filled 2011-12-27: qty 15

## 2011-12-27 MED ORDER — DEXTROSE 5 % IV SOLN: 1.0000 g | Freq: Once | INTRAVENOUS | Status: DC

## 2011-12-27 MED ORDER — POTASSIUM CHLORIDE 10 MEQ/100ML IV SOLN
10.0000 meq | Freq: Once | INTRAVENOUS | Status: AC
  Administered 2011-12-27: 10 meq via INTRAVENOUS
  Filled 2011-12-27: qty 100

## 2011-12-27 MED ORDER — LORAZEPAM 0.5 MG PO TABS
0.5000 mg | ORAL_TABLET | ORAL | Status: DC | PRN
  Administered 2011-12-28 (×2): 0.5 mg via ORAL
  Filled 2011-12-27 (×2): qty 1

## 2011-12-27 MED ORDER — LORAZEPAM 2 MG/ML IJ SOLN: 0.5000 mg | INTRAMUSCULAR | Status: DC | PRN

## 2011-12-27 MED ORDER — ENOXAPARIN SODIUM 40 MG/0.4ML ~~LOC~~ SOLN
40.0000 mg | SUBCUTANEOUS | Status: DC
  Administered 2011-12-27 – 2011-12-28 (×2): 40 mg via SUBCUTANEOUS
  Filled 2011-12-27 (×2): qty 0.4

## 2011-12-27 MED ORDER — POTASSIUM CHLORIDE 10 MEQ/100ML IV SOLN
10.0000 meq | INTRAVENOUS | Status: AC
  Administered 2011-12-27 (×5): 10 meq via INTRAVENOUS
  Filled 2011-12-27 (×5): qty 100

## 2011-12-27 MED ORDER — ACETAMINOPHEN 650 MG RE SUPP: 650.0000 mg | Freq: Four times a day (QID) | RECTAL | Status: DC | PRN

## 2011-12-27 MED ORDER — CIPROFLOXACIN HCL 250 MG PO TABS
250.0000 mg | ORAL_TABLET | Freq: Two times a day (BID) | ORAL | Status: DC
  Administered 2011-12-27 – 2011-12-29 (×4): 250 mg via ORAL
  Filled 2011-12-27 (×4): qty 1

## 2011-12-27 MED ORDER — ALUM & MAG HYDROXIDE-SIMETH 200-200-20 MG/5ML PO SUSP: 30.0000 mL | Freq: Four times a day (QID) | ORAL | Status: DC | PRN

## 2011-12-27 MED ORDER — TRIAMCINOLONE ACETONIDE 0.1 % EX CREA
TOPICAL_CREAM | CUTANEOUS | Status: AC
  Filled 2011-12-27: qty 15

## 2011-12-27 MED ORDER — SODIUM CHLORIDE 0.9 % IV BOLUS (SEPSIS)
1000.0000 mL | Freq: Once | INTRAVENOUS | Status: AC
  Administered 2011-12-27: 1000 mL via INTRAVENOUS

## 2011-12-27 MED ORDER — ONDANSETRON HCL 4 MG PO TABS: 4.0000 mg | ORAL_TABLET | Freq: Four times a day (QID) | ORAL | Status: DC | PRN

## 2011-12-27 MED ORDER — ACETAMINOPHEN 325 MG PO TABS: 650.0000 mg | ORAL_TABLET | Freq: Four times a day (QID) | ORAL | Status: DC | PRN

## 2011-12-27 MED ORDER — SODIUM CHLORIDE 0.9 % IV SOLN
INTRAVENOUS | Status: DC
  Administered 2011-12-28: 09:00:00 via INTRAVENOUS
  Administered 2011-12-29: 1000 mL via INTRAVENOUS

## 2011-12-27 MED ORDER — ENOXAPARIN SODIUM 40 MG/0.4ML ~~LOC~~ SOLN: 40.0000 mg | SUBCUTANEOUS | Status: DC

## 2011-12-27 MED ORDER — ONDANSETRON HCL 4 MG/2ML IJ SOLN
4.0000 mg | Freq: Four times a day (QID) | INTRAMUSCULAR | Status: DC | PRN
  Administered 2011-12-28: 4 mg via INTRAVENOUS
  Filled 2011-12-27: qty 2

## 2011-12-27 MED ORDER — LORAZEPAM 2 MG/ML IJ SOLN
1.0000 mg | Freq: Once | INTRAMUSCULAR | Status: AC
  Administered 2011-12-27: 1 mg via INTRAVENOUS
  Filled 2011-12-27: qty 1

## 2011-12-27 NOTE — ED Provider Notes (Addendum)
History  Scribed for Kayla Lennert, MD, the patient was seen in room [facility]. This chart was scribed by Kayla [name]. The patient's care started at 12:38 PM    CSN: [id number]  Arrival date & time 12/27/11  1040   First MD Initiated Contact with Patient 12/27/11 1236      Chief Complaint  Patient presents with  . Cough  . Sore Throat     Patient is a 41 y.o. female presenting with cough and weakness. The history is provided by the patient.  Cough This is a chronic problem. The current episode started more than 1 week ago. The problem occurs constantly. The problem has been gradually worsening. The cough is productive of sputum. There has been no fever. The fever has been present for 5 days or more. Associated symptoms include ear pain (right) and sore throat. Pertinent negatives include no headaches. She has tried nothing for the symptoms. The treatment provided no relief. Risk factors: unknown. She is not a smoker. Her past medical history does not include pneumonia.  Weakness Primary symptoms do not include headaches or loss of consciousness. The symptoms began more than 1 week ago. The symptoms are unchanged. The neurological symptoms are multifocal. The symptoms occurred on exertion.  Additional symptoms include weakness. Medical issues do not include seizures. Procedure history comments: none.   Kayla [name] is a 41 y.o. female who presents to the Emergency Department complaining of cough and sore throat that she has had for one month that has gotten worse over the last two weeks.  Pt is also experiencing swelling in legs, right ear pain, vomiting, a rash on her abdomen, and three ulcers on her back.  She states that she is afraid she has cancer and her children tell her she says strange things.  Pt is very anxious.  She denies hearing voices or suicidal ideation.  Her PCP is Dr. Juanetta [name].  She states that she saw Dr. Juanetta [name] two weeks ago.      Past Medical History    Diagnosis Date  . Ovarian cyst     cervical polyp  . Anxiety     Panic attacks; somatization  . Palpitations     possible bradycardia    Past Surgical History  Procedure Date  . None     Family History  Problem Relation Age of Onset  . Cancer Father   . Heart failure Other   . Hypertension Mother   . Diabetes Mother     History  Substance Use Topics  . Smoking status: Never Smoker   . Smokeless tobacco: Never Used  . Alcohol Use: No    OB History    Grav Para Term Preterm Abortions TAB SAB Ect Mult Living   3 3 3       3       Review of Systems  HENT: Positive for ear pain (right) and sore throat.   Respiratory: Positive for cough.   Cardiovascular: Positive for leg swelling.  Musculoskeletal:       Three ulcers on back  Skin: Positive for rash (abdomen).  Neurological: Positive for weakness. Negative for loss of consciousness and headaches.  Psychiatric/Behavioral: Negative for suicidal ideas.       Very anxious    Allergies  Review of patient's allergies indicates no known allergies.  Home Medications   Current Outpatient Rx  Name Route Sig Dispense Refill  . FUROSEMIDE 40 MG PO TABS Oral Take 40 mg by mouth daily.      Marland Kitchen  ADULT MULTIVITAMIN W/MINERALS CH Oral Take 1 tablet by mouth daily.        BP 135/89  Pulse 114  Temp(Src) 98 F (36.7 C) (Oral)  Resp 20  Ht 5\' 4"  (1.626 m)  Wt 177 lb (80.287 kg)  BMI 30.38 kg/m2  SpO2 100%  LMP 12/01/2011  Physical Exam  Nursing note and vitals reviewed. Constitutional: She is oriented to person, place, and time. She appears well-developed and well-nourished. She appears distressed.  HENT:  Head: Normocephalic and atraumatic.       Dry mucus membranes  Eyes: EOM are normal. Right eye exhibits no discharge. Left eye exhibits no discharge.  Neck: Normal range of motion. Neck supple.  Cardiovascular:       tachycardic  Pulmonary/Chest: No respiratory distress.  Abdominal: There is no tenderness.        Macular rash on abdomen   Musculoskeletal: She exhibits no tenderness.       Three ulcers on back about 1/2cm in diameter.  Neurological: She is alert and oriented to person, place, and time.  Skin: Skin is warm and dry.  Psychiatric:       Very anxious    ED Course  Procedures   DIAGNOSTIC STUDIES: Oxygen Saturation is 100% on room air, normal by my interpretation.    COORDINATION OF CARE:  12:45PM Urine culture; CBC ; Differential ; Comprehensive metabolic panel ; CT Head Wo Contrast ; DG Chest 2 View ; sodium chloride 0.9 % bolus 1,000 mL ; LORazepam (ATIVAN) injection 1 mg  2:37 PM Recheck: Discussed low potassium levels and course of care including need for admit.    Labs Reviewed  URINALYSIS, ROUTINE W REFLEX MICROSCOPIC - Abnormal; Notable for the following:    APPearance HAZY (*)    Specific Gravity, Urine >1.030 (*)    Hgb urine dipstick MODERATE (*)    Bilirubin Urine MODERATE (*)    Ketones, ur 40 (*)    Protein, ur 30 (*)    All other components within normal limits  URINE MICROSCOPIC-ADD ON - Abnormal; Notable for the following:    Squamous Epithelial / LPF MANY (*)    Bacteria, UA MANY (*)    All other components within normal limits  CBC - Abnormal; Notable for the following:    RBC 5.36 (*)    MCV 77.2 (*)    MCH 25.6 (*)    RDW 17.6 (*)    All other components within normal limits  DIFFERENTIAL - Abnormal; Notable for the following:    Monocytes Relative 14 (*)    All other components within normal limits  COMPREHENSIVE METABOLIC PANEL - Abnormal; Notable for the following:    Potassium 2.5 (*)    Chloride 88 (*)    CO2 33 (*)    Glucose, Bld 137 (*)    AST 40 (*)    ALT 60 (*)    All other components within normal limits  URINE CULTURE   Dg Chest 2 View  12/27/2011  *RADIOLOGY REPORT*  Clinical Data: Dizziness, rash  CHEST - 2 VIEW  Comparison: None.  Findings: Normal mediastinum and cardiac silhouette.  Normal pulmonary  vasculature.  No  evidence of effusion, infiltrate, or pneumothorax.  No acute bony abnormality.  IMPRESSION: No acute cardiopulmonary process.  Original Report Authenticated By: Genevive Bi, M.D.   Ct Head Wo Contrast  12/27/2011  *RADIOLOGY REPORT*  Clinical Data: Altered mental status.  CT HEAD WITHOUT CONTRAST  Technique:  Contiguous axial images  were obtained from the base of the skull through the vertex without contrast.  Comparison: CT head without contrast 10/26/2011.  Findings: No acute intracranial abnormality is present. Specifically, there is no evidence for acute infarct, hemorrhage, mass, hydrocephalus, or extra-axial fluid collection.  The paranasal sinuses and mastoid air cells are clear.  The globes and orbits are intact.  The osseous skull is intact.  IMPRESSION: Negative CT of the head.  Original Report Authenticated By: Jamesetta Orleans. MATTERN, M.D.     No diagnosis found.    MDM  Hypokalemia an uti,  dehydration The chart was scribed for me under my direct supervision.  I personally performed the history, physical, and medical decision making and all procedures in the evaluation of this patient..   The chart was scribed for me under my direct supervision.  I personally performed the history, physical, and medical decision making and all procedures in the evaluation of this patient.Kayla Lennert, MD 12/27/11 1445  Kayla Lennert, MD 12/27/11 [phone]

## 2011-12-27 NOTE — ED Notes (Signed)
CRITICAL VALUE ALERT  Critical value received:  K+ 2.5  Date of notification:  12/27/2011  Time of notification:  1407  Critical value read back:yes  Nurse who received alert:  Eustace Quail  MD notified (1st page):  Dr. Estell Harpin  Time of first page:  1408  MD notified (2nd page):  Time of second page:  Responding MD:  Dr. Estell Harpin  Time MD responded:  [phone]

## 2011-12-27 NOTE — ED Notes (Signed)
Productive, cough white in color x 1 mo. Worse past 2 weeks. States dry mouth. Anxiety. "My body is shutting down, legs are swelling up. I'm not thinking straight, like something is happening to me".

## 2011-12-27 NOTE — ED Notes (Signed)
Dr. Ouida Sills called at this time for admission! lisa

## 2011-12-27 NOTE — H&P (Signed)
Kayla [name], Kayla [name]               ACCOUNT NO.:  [id number]  MEDICAL RECORD NO.:  [id number]  LOCATION:  A308                          FACILITY:  [facility]  PHYSICIAN:  Kingsley Callander. Ouida Sills, MD       DATE OF BIRTH:  03/10/1971  DATE OF ADMISSION:  12/27/2011 DATE OF DISCHARGE:  LH                             HISTORY & PHYSICAL   CHIEF COMPLAINT:  Weakness.  HISTORY OF PRESENT ILLNESS:  This patient is a 41 year old white female, patient of Dr. Juanetta Gosling who presented to the emergency room with weakness and recurrent vomiting.  The patient was found to be hypokalemic at 2.5. She has not vomited while in the emergency room.  She denies any hematemesis or diarrhea.  She has had some intermittent abdominal discomfort.  She was also found to have evidence of an UTI.  She has had UTIs in the past and has had minimal recent dysuria.  She has not had gross hematuria.  She has had prior evaluation by Dr. Jerre Simon.  The patient has exhibited significant anxiety and has been treated with Ativan.  PAST MEDICAL HISTORY: 1. UTI. 2. Anxiety. 3. Cervical polyp.  MEDICATIONS:  Lasix p.r.n., multivitamin daily.  ALLERGIES:  None.  SOCIAL HISTORY:  She does not smoke cigarettes or drink alcohol or use recreational substances.  FAMILY HISTORY:  Her father died of lymphoma.  Her mother has had hypertension and diabetes.  REVIEW OF SYSTEMS:  She complains of chronic leg swelling.  She has had a sore throat and cough.  She denies fever.  She has had a rash on the trunk.  PHYSICAL EXAMINATION:  VITAL SIGNS:  Temperature 97.3, pulse 98, respirations 20, blood pressure 131/85, oxygen saturation 96%. GENERAL:  Alert and in no distress. HEENT:  Eyes, nose, and pharynx are unremarkable. NECK:  Supple with no lymphadenopathy, JVD, or thyromegaly. LUNGS:  Clear. HEART:  Regular with no murmurs. ABDOMEN:  Reveals minimal suprapubic tenderness.  No CVA tenderness.  No hepatosplenomegaly. EXTREMITIES:  No leg edema  is present, although she feels that her legs are swollen.  No calf tenderness. NEUROLOGIC:  She is anxious. LYMPH NODES:  No enlargement. SKIN:  She has a red papular rash on the abdomen and some evidence of scratch marks.  LABORATORY DATA:  Sodium 136, potassium 2.5bicarb 33, BUN 14, creatinine 0.71, calcium 9.7, glucose 137, albumin 4.1, AST 48, ALT 60, alk phos 80, total protein 7.6.  White count 6.3, hemoglobin 13.7, platelets 172,000, 69 segs, 17 lymphs, 14 monos, no eos.  Urinalysis reveals 40 mg/dL of ketones, 30 mg/dL of protein, 40-98 white cells, 11-20 red cells, many squamous epithelial cells, and many bacteria.  An x-ray of the chest reveals no infiltrate.  She has recently had a left lower extremity ultrasound, which was unremarkable.  She underwent a CT scan of the brain in the emergency room, which was negative.  Her EKG reveals sinus tachycardia at 102 beats per minute and is otherwise normal.  IMPRESSION/PLAN: 1. Hypokalemia with repeated bouts of vomiting.  She will require IV     potassium replacement.  She will be treated with Zofran p.r.n.  Her     exam is  unremarkable. 2. Elevated liver function studies.  Further evaluation per Dr.     Juanetta Gosling. 3. Urinary tract infection.  Urine culture has been obtained.  She was     treated with Rocephin 1 g in the ER.  She will be continued on     Cipro. 4. Hyperglycemia.  We will recheck. 5. Anxiety, Ativan p.r.n.     Kingsley Callander. Ouida Sills, MD     ROF/MEDQ  D:  12/27/2011  T:  12/27/2011  Job:  [id number]

## 2011-12-28 LAB — BASIC METABOLIC PANEL
CO2: 32 mEq/L (ref 19–32)
Calcium: 8.6 mg/dL (ref 8.4–10.5)
Creatinine, Ser: 0.64 mg/dL (ref 0.50–1.10)
GFR calc Af Amer: >90 mL/min (ref >90)
GFR calc non Af Amer: >90 mL/min (ref >90)
Sodium: 139 mEq/L (ref 135–145)

## 2011-12-28 LAB — URINE CULTURE
Colony Count: NO GROWTH
Culture  Setup Time: 201303162006
Culture: NO GROWTH

## 2011-12-28 LAB — TSH: TSH: 1.935 u[IU]/mL (ref 0.350–4.500)

## 2011-12-28 LAB — T4, FREE: Free T4: 1.41 ng/dL (ref 0.80–1.80)

## 2011-12-28 MED ORDER — BIOTENE DRY MOUTH MT LIQD: 15.0000 mL | OROMUCOSAL | Status: DC | PRN

## 2011-12-28 MED ORDER — BUSPIRONE HCL 5 MG PO TABS
10.0000 mg | ORAL_TABLET | Freq: Two times a day (BID) | ORAL | Status: DC
  Administered 2011-12-28 – 2011-12-29 (×3): 10 mg via ORAL
  Filled 2011-12-28 (×3): qty 2

## 2011-12-28 MED ORDER — POTASSIUM CHLORIDE CRYS ER 20 MEQ PO TBCR
20.0000 meq | EXTENDED_RELEASE_TABLET | Freq: Two times a day (BID) | ORAL | Status: DC
  Administered 2011-12-28 – 2011-12-29 (×3): 20 meq via ORAL
  Filled 2011-12-28 (×3): qty 1

## 2011-12-28 NOTE — Progress Notes (Signed)
Subjective: She was admitted with hypokalemia and dehydration. She apparently has a urinary tract infection as well. She has had multiple evaluations as an outpatient over the last several months with multiple somatic complaints. She has lost weight. She has significant mental illness and is not following with any mental health professional which I have suggested to her on multiple occasions  Objective: Vital signs in last 24 hours: Temp:  [97.3 F (36.3 C)-98.1 F (36.7 C)] 97.9 F (36.6 C) (03/17 0505) Pulse Rate:  [95-114] 95  (03/17 0505) Resp:  [20] 20  (03/17 0505) BP: (107-135)/(73-89) 111/75 mmHg (03/17 0505) SpO2:  [94 %-100 %] 99 % (03/17 0505) Weight:  [80.2 kg (176 lb 12.9 oz)-80.287 kg (177 lb)] 80.2 kg (176 lb 12.9 oz) (03/16 1707) Weight change:  Last BM Date: 12/23/11  Intake/Output from previous day: 03/16 0701 - 03/17 0700 In: 1264 [P.O.:480; I.V.:584; IV Piggyback:200] Out: -   PHYSICAL EXAM General appearance: alert, cooperative, no distress and moderately obese Resp: clear to auscultation bilaterally Cardio: regular rate and rhythm, S1, S2 normal, no murmur, click, rub or gallop GI: soft, non-tender; bowel sounds normal; no masses,  no organomegaly Extremities: extremities normal, atraumatic, no cyanosis or edema  Lab Results:    Basic Metabolic Panel:  Basename 12/28/11 0620 12/27/11 1254  NA 139 136  K 3.1* 2.5*  CL 101 88*  CO2 32 33*  GLUCOSE 104* 137*  BUN 10 14  CREATININE 0.64 0.71  CALCIUM 8.6 9.7  MG -- --  PHOS -- --   Liver Function Tests:  Basename 12/27/11 1254  AST 40*  ALT 60*  ALKPHOS 80  BILITOT 0.5  PROT 7.6  ALBUMIN 4.1   No results found for this basename: LIPASE:2,AMYLASE:2 in the last 72 hours No results found for this basename: AMMONIA:2 in the last 72 hours CBC:  Basename 12/27/11 1254  WBC 6.3  NEUTROABS 4.3  HGB 13.7  HCT 41.4  MCV 77.2*  PLT 172   Cardiac Enzymes: No results found for this basename:  CKTOTAL:3,CKMB:3,CKMBINDEX:3,TROPONINI:3 in the last 72 hours BNP: No results found for this basename: PROBNP:3 in the last 72 hours D-Dimer: No results found for this basename: DDIMER:2 in the last 72 hours CBG: No results found for this basename: GLUCAP:6 in the last 72 hours Hemoglobin A1C: No results found for this basename: HGBA1C in the last 72 hours Fasting Lipid Panel: No results found for this basename: CHOL,HDL,LDLCALC,TRIG,CHOLHDL,LDLDIRECT in the last 72 hours Thyroid Function Tests: No results found for this basename: TSH,T4TOTAL,FREET4,T3FREE,THYROIDAB in the last 72 hours Anemia Panel: No results found for this basename: VITAMINB12,FOLATE,FERRITIN,TIBC,IRON,RETICCTPCT in the last 72 hours Coagulation: No results found for this basename: LABPROT:2,INR:2 in the last 72 hours Urine Drug Screen: Drugs of Abuse  No results found for this basename: labopia, cocainscrnur, labbenz, amphetmu, thcu, labbarb    Alcohol Level: No results found for this basename: ETH:2 in the last 72 hours Urinalysis:  Basename 12/27/11 1138  COLORURINE YELLOW  LABSPEC >1.030*  PHURINE 6.0  GLUCOSEU NEGATIVE  HGBUR MODERATE*  BILIRUBINUR MODERATE*  KETONESUR 40*  PROTEINUR 30*  UROBILINOGEN 0.2  NITRITE NEGATIVE  LEUKOCYTESUR NEGATIVE   Misc. Labs:  ABGS No results found for this basename: PHART,PCO2,PO2ART,TCO2,HCO3 in the last 72 hours CULTURES No results found for this or any previous visit (from the past 240 hour(s)). Studies/Results: Dg Chest 2 View  12/27/2011  *RADIOLOGY REPORT*  Clinical Data: Dizziness, rash  CHEST - 2 VIEW  Comparison: None.  Findings: Normal  mediastinum and cardiac silhouette.  Normal pulmonary  vasculature.  No evidence of effusion, infiltrate, or pneumothorax.  No acute bony abnormality.  IMPRESSION: No acute cardiopulmonary process.  Original Report Authenticated By: Genevive Bi, M.D.   Ct Head Wo Contrast  12/27/2011  *RADIOLOGY REPORT*  Clinical  Data: Altered mental status.  CT HEAD WITHOUT CONTRAST  Technique:  Contiguous axial images were obtained from the base of the skull through the vertex without contrast.  Comparison: CT head without contrast 10/26/2011.  Findings: No acute intracranial abnormality is present. Specifically, there is no evidence for acute infarct, hemorrhage, mass, hydrocephalus, or extra-axial fluid collection.  The paranasal sinuses and mastoid air cells are clear.  The globes and orbits are intact.  The osseous skull is intact.  IMPRESSION: Negative CT of the head.  Original Report Authenticated By: Jamesetta Orleans. MATTERN, M.D.    Medications:  Scheduled:   . busPIRone  10 mg Oral BID  . cefTRIAXone (ROCEPHIN) IVPB 1 gram/50 mL D5W  1 g Intravenous Once  . ciprofloxacin  250 mg Oral BID  . enoxaparin  40 mg Subcutaneous Q24H  . LORazepam  1 mg Intravenous Once  . potassium chloride  10 mEq Intravenous Once  . potassium chloride  10 mEq Intravenous Once  . potassium chloride  10 mEq Intravenous Q1 Hr x 6  . potassium chloride  20 mEq Oral BID  . sodium chloride  1,000 mL Intravenous Once  . triamcinolone cream   Topical BID  . DISCONTD: cefTRIAXone (ROCEPHIN) 1 GM IVPB  1 g Intravenous Once  . DISCONTD: enoxaparin (LOVENOX) injection  40 mg Subcutaneous Q24H   Continuous:   . sodium chloride 50 mL/hr at 12/28/11 1610   RUE:[id number], acetaminophen, alum & mag hydroxide-simeth, LORazepam, LORazepam, ondansetron (ZOFRAN) IV, ondansetron  Assesment: She is still somewhat hypokalemic. She is not on her medications for anxiety Active Problems:  * No active hospital problems. *     Plan: I will replace her potassium I've restarted BuSpar and will continue with other treatments. Because of the weight loss I did order TSH    LOS: 1 day   Hieu Herms L 12/28/2011, 10:34 AM

## 2011-12-29 LAB — BASIC METABOLIC PANEL
BUN: 8 mg/dL (ref 6–23)
CO2: 29 mEq/L (ref 19–32)
Chloride: 106 mEq/L (ref 96–112)
GFR calc Af Amer: >90 mL/min (ref >90)
Potassium: 3.9 mEq/L (ref 3.5–5.1)

## 2011-12-29 MED ORDER — MUPIROCIN 2 % EX OINT: TOPICAL_OINTMENT | Freq: Two times a day (BID) | CUTANEOUS | Status: DC

## 2011-12-29 MED ORDER — POTASSIUM CHLORIDE ER 10 MEQ PO TBCR: 20.0000 meq | EXTENDED_RELEASE_TABLET | Freq: Every day | ORAL | Status: DC

## 2011-12-29 MED ORDER — BUSPIRONE HCL 10 MG PO TABS: 10.0000 mg | ORAL_TABLET | Freq: Two times a day (BID) | ORAL | Status: DC
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
## 2011-12-29 NOTE — Progress Notes (Signed)
Patient transported out by RN and patient in stable condition.

## 2011-12-29 NOTE — Progress Notes (Signed)
Reviewed discharge instructions with patient, patient voiced understanding. Patient given discharge instructions. Called [facility] and verified prescriptions received. Patient in stable condition but patient does not want to go home. Dr. Juanetta Gosling aware. Patient understands to make and attend her follow up appointment and pick up her medications.

## 2011-12-29 NOTE — Progress Notes (Signed)
   CARE MANAGEMENT NOTE 12/29/2011  Patient:  Kayla [name], Kayla [name]   Account Number:  [id number]  Date Initiated:  12/29/2011  Documentation initiated by:  Sharrie Rothman  Subjective/Objective Assessment:   Pt admitted from home with vague compliants but does have a UTI. Lives with husband.     Action/Plan:   CM assessed pt and no CM needs noted at this time.   Anticipated DC Date:  12/30/2011   Anticipated DC Plan:  HOME/SELF CARE      DC Planning Services  CM consult      Choice offered to / List presented to:             Status of service:  Completed, signed off Medicare Important Message given?   (If response is "NO", the following Medicare IM given date fields will be blank) Date Medicare IM given:   Date Additional Medicare IM given:    Discharge Disposition:  HOME/SELF CARE  Per UR Regulation:    If discussed at Long Length of Stay Meetings, dates discussed:    Comments:  12/29/11 0925 Arlyss Queen RN BSN CM [phone] Pt admitted from home with family. No CM needs noted at this time.

## 2011-12-29 NOTE — Progress Notes (Signed)
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 We discussed these again at length. She has had multiple workups. Please note the R. and progress note from last night where she thinks that someone is putting personal and her and that she has been associated with a "witch Doctor".  Objective: Vital signs in last 24 hours: Temp:  [97.4 F (36.3 C)-97.8 F (36.6 C)] 97.8 F (36.6 C) (03/18 0642) Pulse Rate:  [75-100] 75  (03/18 0642) Resp:  [20] 20  (03/18 0642) BP: (97-120)/(63-82) 97/63 mmHg (03/18 0642) SpO2:  [95 %-98 %] 95 % (03/18 0642) Weight change:  Last BM Date: 12/28/11  Intake/Output from previous day: 03/17 0701 - 03/18 0700 In: 720 [P.O.:720] Out: 3 [Urine:3]  PHYSICAL EXAM General appearance: alert, cooperative and mild distress Resp: clear to auscultation bilaterally Cardio: regular rate and rhythm, S1, S2 normal, no murmur, click, rub or gallop GI: soft, non-tender; bowel sounds normal; no masses,  no organomegaly Extremities: extremities normal, atraumatic, no cyanosis or edema  Lab Results:    Basic Metabolic Panel:  Basename 12/28/11 0620 12/27/11 1254  NA 139 136  K 3.1* 2.5*  CL 101 88*  CO2 32 33*  GLUCOSE 104* 137*  BUN 10 14  CREATININE 0.64 0.71  CALCIUM 8.6 9.7  MG -- --  PHOS -- --   Liver Function Tests:  Basename 12/27/11 1254  AST 40*  ALT 60*  ALKPHOS 80  BILITOT 0.5  PROT 7.6  ALBUMIN 4.1   No results found for this basename: LIPASE:2,AMYLASE:2 in the last 72 hours No results found for this basename: AMMONIA:2 in the last 72 hours CBC:  Basename 12/27/11 1254  WBC 6.3  NEUTROABS 4.3  HGB 13.7  HCT 41.4  MCV 77.2*  PLT 172   Cardiac Enzymes: No results found for this basename: CKTOTAL:3,CKMB:3,CKMBINDEX:3,TROPONINI:3 in  the last 72 hours BNP: No results found for this basename: PROBNP:3 in the last 72 hours D-Dimer: No results found for this basename: DDIMER:2 in the last 72 hours CBG: No results found for this basename: GLUCAP:6 in the last 72 hours Hemoglobin A1C: No results found for this basename: HGBA1C in the last 72 hours Fasting Lipid Panel: No results found for this basename: CHOL,HDL,LDLCALC,TRIG,CHOLHDL,LDLDIRECT in the last 72 hours Thyroid Function Tests:  Basename 12/28/11 1030  TSH 1.935  T4TOTAL --  FREET4 1.41  T3FREE --  THYROIDAB --   Anemia Panel: No results found for this basename: VITAMINB12,FOLATE,FERRITIN,TIBC,IRON,RETICCTPCT in the last 72 hours Coagulation: No results found for this basename: LABPROT:2,INR:2 in the last 72 hours Urine Drug Screen: Drugs of Abuse  No results found for this basename: labopia, cocainscrnur, labbenz, amphetmu, thcu, labbarb    Alcohol Level: No results found for this basename: ETH:2 in the last 72 hours Urinalysis:  Basename 12/27/11 1138  COLORURINE YELLOW  LABSPEC >1.030*  PHURINE 6.0  GLUCOSEU NEGATIVE  HGBUR MODERATE*  BILIRUBINUR MODERATE*  KETONESUR 40*  PROTEINUR 30*  UROBILINOGEN 0.2  NITRITE NEGATIVE  LEUKOCYTESUR NEGATIVE   Misc. Labs:  ABGS No results found for this basename: PHART,PCO2,PO2ART,TCO2,HCO3 in the last 72 hours CULTURES Recent Results (from the past 240 hour(s))  URINE CULTURE     Status: Normal   Collection Time   12/27/11 12:50 PM      Component Value Range Status Comment  Specimen Description URINE, CLEAN CATCH   Final    Special Requests NONE   Final    Culture  Setup Time [id number]   Final    Colony Count NO GROWTH   Final    Culture NO GROWTH   Final    Report Status 12/28/2011 FINAL   Final    Studies/Results: Dg Chest 2 View  12/27/2011  *RADIOLOGY REPORT*  Clinical Data: Dizziness, rash  CHEST - 2 VIEW  Comparison: None.  Findings: Normal mediastinum and cardiac silhouette.   Normal pulmonary  vasculature.  No evidence of effusion, infiltrate, or pneumothorax.  No acute bony abnormality.  IMPRESSION: No acute cardiopulmonary process.  Original Report Authenticated By: Genevive Bi, M.D.   Ct Head Wo Contrast  12/27/2011  *RADIOLOGY REPORT*  Clinical Data: Altered mental status.  CT HEAD WITHOUT CONTRAST  Technique:  Contiguous axial images were obtained from the base of the skull through the vertex without contrast.  Comparison: CT head without contrast 10/26/2011.  Findings: No acute intracranial abnormality is present. Specifically, there is no evidence for acute infarct, hemorrhage, mass, hydrocephalus, or extra-axial fluid collection.  The paranasal sinuses and mastoid air cells are clear.  The globes and orbits are intact.  The osseous skull is intact.  IMPRESSION: Negative CT of the head.  Original Report Authenticated By: Jamesetta Orleans. MATTERN, M.D.    Medications:  Prior to Admission:  Prescriptions prior to admission  Medication Sig Dispense Refill  . furosemide (LASIX) 40 MG tablet Take 40 mg by mouth daily.        . Multiple Vitamin (MULITIVITAMIN WITH MINERALS) TABS Take 1 tablet by mouth daily.         Scheduled:   . busPIRone  10 mg Oral BID  . cefTRIAXone (ROCEPHIN) IVPB 1 gram/50 mL D5W  1 g Intravenous Once  . enoxaparin  40 mg Subcutaneous Q24H  . mupirocin ointment   Topical BID  . potassium chloride  20 mEq Oral BID  . triamcinolone cream   Topical BID  . DISCONTD: ciprofloxacin  250 mg Oral BID   Continuous:   . sodium chloride 1,000 mL (12/29/11 0306)   MWU:[id number], acetaminophen, alum & mag hydroxide-simeth, antiseptic oral rinse, LORazepam, LORazepam, ondansetron (ZOFRAN) IV, ondansetron  Assesment: I think her major problem is mental illness. She needs followup for this. She has been hypokalemic and that will be rechecked. I rechecked her thyroid because she was complaining of weight loss and hair loss but that was  normal Active Problems:  * No active hospital problems. *     Plan: She'll probably be able to go home later today if her potassium is okay    LOS: 2 days   Aime Meloche L 12/29/2011, 8:36 AM

## 2011-12-29 NOTE — Progress Notes (Signed)
Utilization review completed.  

## 2011-12-31 NOTE — Discharge Summary (Signed)
Physician Discharge Summary  Patient ID: Kayla [name] MRN: [id number] DOB/AGE: 1971-08-09 41 y.o. Primary Care Physician:Morgin Halls L, MD, MD Admit date: 12/27/2011 Discharge date: 12/31/2011    Discharge Diagnoses:  Hypokalemia Mental illness Nausea and vomiting Dehydration Active Problems:  * No active hospital problems. *    Medication List  As of 12/31/2011  6:10 PM   TAKE these medications         busPIRone 10 MG tablet   Commonly known as: BUSPAR   Take 1 tablet (10 mg total) by mouth 2 (two) times daily.      furosemide 40 MG tablet   Commonly known as: LASIX   Take 40 mg by mouth daily.      mulitivitamin with minerals Tabs   Take 1 tablet by mouth daily.      potassium chloride 10 MEQ tablet   Commonly known as: K-DUR   Take 2 tablets (20 mEq total) by mouth daily.            Discharged Condition: Improved    Consults: None  Significant Diagnostic Studies: Dg Chest 2 View  12/27/2011  *RADIOLOGY REPORT*  Clinical Data: Dizziness, rash  CHEST - 2 VIEW  Comparison: None.  Findings: Normal mediastinum and cardiac silhouette.  Normal pulmonary  vasculature.  No evidence of effusion, infiltrate, or pneumothorax.  No acute bony abnormality.  IMPRESSION: No acute cardiopulmonary process.  Original Report Authenticated By: Genevive Bi, M.D.   Ct Head Wo Contrast  12/27/2011  *RADIOLOGY REPORT*  Clinical Data: Altered mental status.  CT HEAD WITHOUT CONTRAST  Technique:  Contiguous axial images were obtained from the base of the skull through the vertex without contrast.  Comparison: CT head without contrast 10/26/2011.  Findings: No acute intracranial abnormality is present. Specifically, there is no evidence for acute infarct, hemorrhage, mass, hydrocephalus, or extra-axial fluid collection.  The paranasal sinuses and mastoid air cells are clear.  The globes and orbits are intact.  The osseous skull is intact.  IMPRESSION: Negative CT of the head.   Original Report Authenticated By: Jamesetta Orleans. MATTERN, M.D.   [place] Misc Soft Tissue  12/10/2011  *RADIOLOGY REPORT*  Clinical Data:  Left thigh pain, abnormal physical exam question hematoma  LEFT LOWER EXTREMITY LIMITED SOFT TISSUE ULTRASOUND  Technique:  Ultrasound examination of the lower extremity soft tissues was performed in the area of clinical concern.  Comparison:  None  Findings: Sonography to the area of the sonographic abnormality at the lateral left thigh was performed.  At this site, sonographically unremarkable subcutaneous tissues are identified.  No evidence of hematoma, mass or fluid collection.  No shadowing calcifications.  IMPRESSION: Normal sonography of the site of clinical concern at the lateral left thigh.  Original Report Authenticated By: Lollie Marrow, M.D.    Lab Results: Basic Metabolic Panel:  Basename 12/29/11 0807  NA 142  K 3.9  CL 106  CO2 29  GLUCOSE 141*  BUN 8  CREATININE 0.61  CALCIUM 8.9  MG --  PHOS --   Liver Function Tests: No results found for this basename: AST:2,ALT:2,ALKPHOS:2,BILITOT:2,PROT:2,ALBUMIN:2 in the last 72 hours   CBC: No results found for this basename: WBC:2,NEUTROABS:2,HGB:2,HCT:2,MCV:2,PLT:2 in the last 72 hours  Recent Results (from the past 240 hour(s))  URINE CULTURE     Status: Normal   Collection Time   12/27/11 12:50 PM      Component Value Range Status Comment   Specimen Description URINE, CLEAN CATCH   Final  Special Requests NONE   Final    Culture  Setup Time [id number]   Final    Colony Count NO GROWTH   Final    Culture NO GROWTH   Final    Report Status 12/28/2011 FINAL   Final      Hospital Course: She was admitted with hypokalemia. She had been having significant nausea and vomiting. She has been having a number of somatic complaints over the last several months. She has had multiple workups none of which have been revealing of anything abnormal. She has mental illness and has not been following  with a mental health professional. She was treated with potassium IV fluids and improved.  Discharge Exam: Blood pressure 97/63, pulse 75, temperature 97.8 F (36.6 C), temperature source Oral, resp. rate 20, height 5\' 4"  (1.626 m), weight 80.2 kg (176 lb 12.9 oz), last menstrual period 12/27/2011, SpO2 95.00%. She was much better. Her chest was clear. Her heart was regular.  Disposition: Home she will followup in my office and we will make her an appointment with the mental health professional if she will keep that appointment  Discharge Orders    Future Orders Please Complete By Expires   Discharge patient         Follow-up Information    Follow up with Cyra Spader L, MD in 2 weeks. (Call to make appointment)    Contact information:   [address] [facility] [address] [phone]          Signed: Fredirick Maudlin Pager [phone]  12/31/2011, 6:10 PM

## 2012-01-11 ENCOUNTER — Encounter (HOSPITAL_COMMUNITY): Payer: Self-pay | Admitting: *Deleted

## 2012-01-11 ENCOUNTER — Emergency Department (HOSPITAL_COMMUNITY)
Admission: EM | Admit: 2012-01-11 | Discharge: 2012-01-11 | Disposition: A | Discharge status: Home / Self Care | Payer: Medicaid Other | Attending: Emergency Medicine | Admitting: Emergency Medicine

## 2012-01-11 DIAGNOSIS — N390 Urinary tract infection, site not specified: Secondary | ICD-10-CM | POA: Insufficient documentation

## 2012-01-11 DIAGNOSIS — L739 Follicular disorder, unspecified: Secondary | ICD-10-CM

## 2012-01-11 DIAGNOSIS — H9209 Otalgia, unspecified ear: Secondary | ICD-10-CM | POA: Insufficient documentation

## 2012-01-11 DIAGNOSIS — R5381 Other malaise: Secondary | ICD-10-CM | POA: Insufficient documentation

## 2012-01-11 DIAGNOSIS — R5383 Other fatigue: Secondary | ICD-10-CM | POA: Insufficient documentation

## 2012-01-11 DIAGNOSIS — L738 Other specified follicular disorders: Secondary | ICD-10-CM | POA: Insufficient documentation

## 2012-01-11 LAB — CBC
HCT: 40.7 % (ref 36.0–46.0)
Hemoglobin: 13.3 g/dL (ref 12.0–15.0)
WBC: 7.8 10*3/uL (ref 4.0–10.5)

## 2012-01-11 LAB — DIFFERENTIAL
Basophils Absolute: 0 10*3/uL (ref 0.0–0.1)
Lymphocytes Relative: 17 % (ref 12–46)
Lymphs Abs: 1.3 10*3/uL (ref 0.7–4.0)
Monocytes Absolute: 0.6 10*3/uL (ref 0.1–1.0)
Monocytes Relative: 8 % (ref 3–12)
Neutro Abs: 5.8 10*3/uL (ref 1.7–7.7)

## 2012-01-11 LAB — BASIC METABOLIC PANEL
CO2: 26 mEq/L (ref 19–32)
Chloride: 95 mEq/L — ABNORMAL LOW (ref 96–112)
GFR calc Af Amer: >90 mL/min (ref >90)
Potassium: 3.5 mEq/L (ref 3.5–5.1)

## 2012-01-11 LAB — URINALYSIS, ROUTINE W REFLEX MICROSCOPIC
Glucose, UA: NEGATIVE mg/dL
Nitrite: NEGATIVE
Specific Gravity, Urine: 1.02 (ref 1.005–1.030)
pH: 5.5 (ref 5.0–8.0)

## 2012-01-11 LAB — URINE MICROSCOPIC-ADD ON

## 2012-01-11 MED ORDER — CEPHALEXIN 500 MG PO CAPS: 500.0000 mg | ORAL_CAPSULE | Freq: Four times a day (QID) | ORAL | Status: AC

## 2012-01-11 MED ORDER — TETANUS-DIPHTH-ACELL PERTUSSIS 5-2.5-18.5 LF-MCG/0.5 IM SUSP
0.5000 mL | Freq: Once | INTRAMUSCULAR | Status: AC
  Administered 2012-01-11: 0.5 mL via INTRAMUSCULAR
  Filled 2012-01-11: qty 0.5

## 2012-01-11 MED ORDER — ANTIPYRINE-BENZOCAINE 5.4-1.4 % OT SOLN
3.0000 [drp] | Freq: Once | OTIC | Status: AC
  Administered 2012-01-11: 4 [drp] via OTIC
  Filled 2012-01-11: qty 10

## 2012-01-11 NOTE — ED Notes (Signed)
Pt also reports having unintentional weight loss over past several months.  States was bitten by her dog several months ago and sx began then.  Pt also c/o left leg swelling and right side of face swelling.

## 2012-01-11 NOTE — ED Notes (Signed)
1- swelling to left lower leg, 2 earaches to both ears, left >right, 3 urine orange in color since Dec per pt. And 4 concerned that she did not get tetanus shot when she got bit by a dog in Dec 2012

## 2012-01-11 NOTE — Discharge Instructions (Signed)
Folliculitis  Folliculitis is an infection and inflammation of the hair follicles. Hair follicles become red and irritated. This inflammation is usually caused by bacteria. The bacteria thrive in warm, moist environments. This condition can be seen anywhere on the body.  CAUSES The most common cause of folliculitis is an infection by germs (bacteria). Fungal and viral infections can also cause the condition. Viral infections may be more common in people whose bodies are unable to fight disease well (weakened immune systems). Examples include people with:  AIDS.   An organ transplant.   Cancer.  People with depressed immune systems, diabetes, or obesity, have a greater risk of getting folliculitis than the general population. Certain chemicals, especially oils and tars, also can cause folliculitis. SYMPTOMS  An early sign of folliculitis is a small, white or yellow pus-filled, itchy lesion (pustule). These lesions appear on a red, inflamed follicle. They are usually less than 5 mm (.20 inches).   The most likely starting points are the scalp, thighs, legs, back and buttocks. Folliculitis is also frequently found in areas of repeated shaving.   When an infection of the follicle goes deeper, it becomes a boil or furuncle. A group of closely packed boils create a larger lesion (a carbuncle). These sores (lesions) tend to occur in hairy, sweaty areas of the body.  TREATMENT   A doctor who specializes in skin problems (dermatologists) treats mild cases of folliculitis with antiseptic washes.   They also use a skin application which kills germs (topical antibiotics). Tea tree oil is a good topical antiseptic as well. It can be found at a health food store. A small percentage of individuals may develop an allergy to the tea tree oil.   Mild to moderate boils respond well to warm water compresses applied three times daily.   In some cases, oral antibiotics should be taken with the skin treatment.     If lesions contain large quantities of pus or fluid, your caregiver may drain them. This allows the topical antibiotics to get to the affected areas better.   Stubborn cases of folliculitis may respond to laser hair removal. This process uses a high intensity light beam (a laser) to destroy the follicle and reduces the scarring from folliculitis. After laser hair removal, hair will no longer grow in the laser treated area.  Patients with long-lasting folliculitis need to find out where the infection is coming from. Germs can live in the nostrils of the patient. This can trigger an outbreak now and then. Sometimes the bacteria live in the nostrils of a family member. This person does not develop the disorder but they repeatedly re-expose others to the germ. To break the cycle of recurrence in the patient, the family member must also undergo treatment. PREVENTION   Individuals who are predisposed to folliculitis should be extremely careful about personal hygiene.   Application of antiseptic washes may help prevent recurrences.   A topical antibiotic cream, mupirocin (Bactroban), has been effective at reducing bacteria in the nostrils. It is applied inside the nose with your little finger. This is done twice daily for a week. Then it is repeated every 6 months.   Because follicle disorders tend to come back, patients must receive follow-up care. Your caregiver may be able to recognize a recurrence before it becomes severe.  SEEK IMMEDIATE MEDICAL CARE IF:   You develop redness, swelling, or increasing pain in the area.   You have a fever.   You are not improving with treatment  or are getting worse.   You have any other questions or concerns.  Document Released: 12/08/2001 Document Revised: 09/18/2011 Document Reviewed: 10/04/2008 [facility] Patient Information 2012 [place], [place].Urinary Tract Infection Infections of the urinary tract can start in several places. A bladder infection  (cystitis), a kidney infection (pyelonephritis), and a prostate infection (prostatitis) are different types of urinary tract infections (UTIs). They usually get better if treated with medicines (antibiotics) that kill germs. Take all the medicine until it is gone. You or your child may feel better in a few days, but TAKE ALL MEDICINE or the infection may not respond and may become more difficult to treat. HOME CARE INSTRUCTIONS   Drink enough water and fluids to keep the urine clear or pale yellow. Cranberry juice is especially recommended, in addition to large amounts of water.   Avoid caffeine, tea, and carbonated beverages. They tend to irritate the bladder.   Alcohol may irritate the prostate.   Only take over-the-counter or prescription medicines for pain, discomfort, or fever as directed by your caregiver.  To prevent further infections:  Empty the bladder often. Avoid holding urine for long periods of time.   After a bowel movement, women should cleanse from front to back. Use each tissue only once.   Empty the bladder before and after sexual intercourse.  FINDING OUT THE RESULTS OF YOUR TEST Not all test results are available during your visit. If your or your child's test results are not back during the visit, make an appointment with your caregiver to find out the results. Do not assume everything is normal if you have not heard from your caregiver or the medical facility. It is important for you to follow up on all test results. SEEK MEDICAL CARE IF:   There is back pain.   Your baby is older than 3 months with a rectal temperature of 100.5 F (38.1 C) or higher for more than 1 day.   Your or your child's problems (symptoms) are no better in 3 days. Return sooner if you or your child is getting worse.  SEEK IMMEDIATE MEDICAL CARE IF:   There is severe back pain or lower abdominal pain.   You or your child develops chills.   You have a fever.   Your baby is older than 3  months with a rectal temperature of 102 F (38.9 C) or higher.   Your baby is 46 months old or younger with a rectal temperature of 100.4 F (38 C) or higher.   There is nausea or vomiting.   There is continued burning or discomfort with urination.  MAKE SURE YOU:   Understand these instructions.   Will watch your condition.   Will get help right away if you are not doing well or get worse.  Document Released: 07/09/2005 Document Revised: 09/18/2011 Document Reviewed: 02/11/2007 [facility] Patient Information 2012 [place], [place].   Use the antibiotic until completed.  You may also used around and ear drop for your right ear applying 3-4 drops every 4 hours if needed for ear discomfort.  Followup with your doctor this week for recheck of your symptoms.

## 2012-01-11 NOTE — ED Provider Notes (Addendum)
History     CSN: [id number]  Arrival date & time 01/11/12  1340   First MD Initiated Contact with Patient 01/11/12 1455      Chief Complaint  Patient presents with  . multiple complaints     (Consider location/radiation/quality/duration/timing/severity/associated sxs/prior treatment) HPI Comments: Patient presents with multiple complaints today, the first being bilateral ear pain right greater than left and worsened since she put a Q-tip in her right ear canal over one month ago.  She also describes swelling over her right temple area which has been present for the past 3-4 months without any injury to this area.  She was seen in this emergency room in December for a dog bite to her left facial area which has since resolved nicely.  She is concerned however because she did not receive a tetanus shot at the time of that injury and has concerns that her right-sided swelling could be do to a tetanus infection.  She has recently been hospitalized for hypokalemia during which time she also expressed concern with this swelling and had a head CT scan dated 12/26/2011 which which was a normal study.  Her third concern is for multiple raised papules on her scalp and her back which have been tender and present since her recent hospitalization.  She denies fevers or chills, no nausea or emesis.  She does report generalized malaise and decreased appetite over the past 6 months.  She has been evaluated by her PCP for this, patient stating that all of her tests have been normal.  The history is provided by the patient.    Past Medical History  Diagnosis Date  . Ovarian cyst     cervical polyp  . Anxiety     Panic attacks; somatization  . Palpitations     possible bradycardia    Past Surgical History  Procedure Date  . None     Family History  Problem Relation Age of Onset  . Cancer Father   . Heart failure Other   . Hypertension Mother   . Diabetes Mother     History  Substance Use  Topics  . Smoking status: Never Smoker   . Smokeless tobacco: Never Used  . Alcohol Use: No    OB History    Grav Para Term Preterm Abortions TAB SAB Ect Mult Living   3 3 3       3       Review of Systems  Constitutional: Positive for appetite change. Negative for fever.  HENT: Negative for congestion, sore throat and neck pain.   Eyes: Negative.   Respiratory: Negative for chest tightness and shortness of breath.   Cardiovascular: Negative for chest pain.  Gastrointestinal: Negative for nausea and abdominal pain.  Genitourinary: Positive for dysuria. Negative for hematuria.  Musculoskeletal: Negative for myalgias, back pain, joint swelling and arthralgias.  Skin: Positive for rash. Negative for wound.  Neurological: Negative for dizziness, weakness, light-headedness, numbness and headaches.  Hematological: Negative.   Psychiatric/Behavioral: Negative.     Allergies  Review of patient's allergies indicates no known allergies.  Home Medications   Current Outpatient Rx  Name Route Sig Dispense Refill  . BUSPIRONE HCL 10 MG PO TABS Oral Take 1 tablet (10 mg total) by mouth 2 (two) times daily. 60 tablet 5  . FUROSEMIDE 40 MG PO TABS Oral Take 40 mg by mouth daily.      . ADULT MULTIVITAMIN W/MINERALS CH Oral Take 1 tablet by mouth daily.      Marland Kitchen  POTASSIUM CHLORIDE ER 10 MEQ PO TBCR Oral Take 2 tablets (20 mEq total) by mouth daily. 30 tablet 12  . CEPHALEXIN 500 MG PO CAPS Oral Take 1 capsule (500 mg total) by mouth 4 (four) times daily. 28 capsule 0    BP 114/98  Pulse 112  Temp(Src) 98.1 F (36.7 C) (Oral)  Resp 22  Ht 5\' 4"  (1.626 m)  Wt 178 lb (80.74 kg)  BMI 30.55 kg/m2  LMP 12/27/2011  Physical Exam  Nursing note and vitals reviewed. Constitutional: She appears well-developed and well-nourished.  HENT:  Head: Normocephalic and atraumatic.  Right Ear: Tympanic membrane, external ear and ear canal normal.  Left Ear: Tympanic membrane, external ear and ear  canal normal.  Mouth/Throat: Oropharynx is clear and moist.  Eyes: Conjunctivae are normal.  Neck: Normal range of motion. Neck supple.  Cardiovascular: Normal rate and normal heart sounds.   Pulmonary/Chest: Effort normal and breath sounds normal.  Abdominal: Soft.  Musculoskeletal: Normal range of motion.  Lymphadenopathy:    She has no cervical adenopathy.  Neurological: She is alert.  Skin: Skin is warm, dry and intact.       Several small,  Scattered papules on posterior scalp, no surrounding erythema, no pustules,  No drainage.  Psychiatric: Her mood appears anxious.    ED Course  Procedures (including critical care time)  Labs Reviewed  URINALYSIS, ROUTINE W REFLEX MICROSCOPIC - Abnormal; Notable for the following:    Bilirubin Urine SMALL (*)    Ketones, ur >80 (*)    Leukocytes, UA SMALL (*)    All other components within normal limits  BASIC METABOLIC PANEL - Abnormal; Notable for the following:    Chloride 95 (*)    Glucose, Bld 102 (*)    All other components within normal limits  CBC - Abnormal; Notable for the following:    RBC 5.14 (*)    MCH 25.9 (*)    RDW 18.6 (*)    All other components within normal limits  URINE MICROSCOPIC-ADD ON - Abnormal; Notable for the following:    Squamous Epithelial / LPF MANY (*)    Bacteria, UA MANY (*)    All other components within normal limits  DIFFERENTIAL  POCT PREGNANCY, URINE   No results found.   1. Urinary tract infection   2. Folliculitis       MDM  UTI.  Culture sent.  Scattered folliculitis on scalp and back.  Will prescribe Keflex 4 times a day.  Patient encouraged to increase fluid intake and to follow up with Dr. Juanetta Gosling this week for recheck of her symptoms.  She reviewed labs prior to discharge.  Also reviewed CT scan from 12/26/2011.  Discussed results with patient and also reassured her that her symptoms today are not in any way related to her dog bite from December and she does not have any  symptoms suggestive of tetanus.  We will update her tetanus today as she is out of date.        Candis Musa, PA 01/11/12 1658  Candis Musa, PA 01/30/12 2255

## 2012-01-11 NOTE — ED Notes (Signed)
Pt reports bil ear pain, worse on right side.  Also c/o right flank pain.

## 2012-01-11 NOTE — ED Provider Notes (Signed)
Medical screening examination/treatment/procedure(s) were performed by non-physician practitioner and as supervising physician I was immediately available for consultation/collaboration.  Angelina Neece R. Jasmynn Pfalzgraf, MD 01/11/12 2334 

## 2012-01-30 ENCOUNTER — Emergency Department (HOSPITAL_COMMUNITY)
Admission: EM | Admit: 2012-01-30 | Discharge: 2012-01-30 | Disposition: A | Discharge status: Home / Self Care | Payer: 59 | Attending: Emergency Medicine | Admitting: Emergency Medicine

## 2012-01-30 ENCOUNTER — Encounter (HOSPITAL_COMMUNITY): Payer: Self-pay | Admitting: *Deleted

## 2012-01-30 DIAGNOSIS — F329 Major depressive disorder, single episode, unspecified: Secondary | ICD-10-CM | POA: Insufficient documentation

## 2012-01-30 DIAGNOSIS — F3289 Other specified depressive episodes: Secondary | ICD-10-CM | POA: Insufficient documentation

## 2012-01-30 DIAGNOSIS — Z79899 Other long term (current) drug therapy: Secondary | ICD-10-CM | POA: Insufficient documentation

## 2012-01-30 DIAGNOSIS — F411 Generalized anxiety disorder: Secondary | ICD-10-CM | POA: Insufficient documentation

## 2012-01-30 DIAGNOSIS — R002 Palpitations: Secondary | ICD-10-CM | POA: Insufficient documentation

## 2012-01-30 DIAGNOSIS — T3691XA Poisoning by unspecified systemic antibiotic, accidental (unintentional), initial encounter: Secondary | ICD-10-CM | POA: Insufficient documentation

## 2012-01-30 DIAGNOSIS — T360X4A Poisoning by penicillins, undetermined, initial encounter: Secondary | ICD-10-CM | POA: Insufficient documentation

## 2012-01-30 LAB — RAPID URINE DRUG SCREEN, HOSP PERFORMED
Benzodiazepines: NOT DETECTED
Cocaine: NOT DETECTED
Opiates: 13 — AB

## 2012-01-30 LAB — POCT PREGNANCY, URINE: Preg Test, Ur: NEGATIVE

## 2012-01-30 NOTE — ED Notes (Signed)
Pt presents to er due to taking too many antibiotics, pt states that she took one dose of antibotic at 1, 3, 7. Dosage took one tablet at 1, one at 3 and two at 7, pt states that she feels like her heart is racing this am.

## 2012-01-30 NOTE — Discharge Instructions (Signed)
Follow up with Day Loraine Leriche in [place] next week. Call for an appointment [phone].  Follow up with dr. Juanetta Gosling next week

## 2012-01-30 NOTE — ED Provider Notes (Signed)
History  This chart was scribed for Joya Gaskins, MD by Cherlynn Perches. The patient was seen in room APA19/APA19. Patient's care was started at 1417.  CSN: [id number]  Arrival date & time 01/30/12  1417   First MD Initiated Contact with Patient 01/30/12 1427      Chief Complaint  Patient presents with  . Drug Overdose     HPI Kayla [name] is a 41 y.o. female who presents to the Emergency Department complaining of an accidental amoxicillin overdose with associated palpitations. Pt reports that she took 4 doses of amoxicillin over a six hour period yesterday afternoon. Pt states that she was not trying to harm herself but was anxious. Pt reports that palpitations began last night and have made it difficult to sleep. Pt states that she was on amoxicillin for strep throat. Pt has a h/o anxiety and palpitations. Pt denies smoking and alcohol use. Nothing improves her symptoms Nothing worsens her symptoms   Pt also reports she has "spots" in her mouth and also reports she may have something wrong with her liver.  She reports pain all over at times   Past Medical History  Diagnosis Date  . Ovarian cyst     cervical polyp  . Anxiety     Panic attacks; somatization  . Palpitations     possible bradycardia    Past Surgical History  Procedure Date  . None     Family History  Problem Relation Age of Onset  . Cancer Father   . Heart failure Other   . Hypertension Mother   . Diabetes Mother     History  Substance Use Topics  . Smoking status: Never Smoker   . Smokeless tobacco: Never Used  . Alcohol Use: No    OB History    Grav Para Term Preterm Abortions TAB SAB Ect Mult Living   3 3 3       3       Review of Systems A complete 10 system review of systems was obtained and all systems are negative except as noted in the HPI and PMH.    Allergies  Review of patient's allergies indicates no known allergies.  Home Medications   Current Outpatient Rx  Name  Route Sig Dispense Refill  . BUSPIRONE HCL 10 MG PO TABS Oral Take 1 tablet (10 mg total) by mouth 2 (two) times daily. 60 tablet 5  . FUROSEMIDE 40 MG PO TABS Oral Take 40 mg by mouth daily.      . ADULT MULTIVITAMIN W/MINERALS CH Oral Take 1 tablet by mouth daily.      Marland Kitchen POTASSIUM CHLORIDE ER 10 MEQ PO TBCR Oral Take 2 tablets (20 mEq total) by mouth daily. 30 tablet 12    BP 139/99  Pulse 119  Temp(Src) 97.6 F (36.4 C) (Oral)  Resp 22  Ht 5\' 4"  (1.626 m)  Wt 180 lb (81.647 kg)  BMI 30.90 kg/m2  SpO2 99%  LMP 01/30/2012  Physical Exam CONSTITUTIONAL: Well developed/well nourished, anxious HEAD AND FACE: Normocephalic/atraumatic EYES: EOMI/PERRL, no icterus ENMT: Mucous membranes moist NECK: supple no meningeal signs CV: S1/S2 noted, no murmurs/rubs/gallops noted LUNGS: Lungs are clear to auscultation bilaterally, no apparent distress NEURO: Pt is awake/alert, moves all extremitiesx4 EXTREMITIES: pulses normal, full ROM SKIN: warm, color normal PSYCH: patient is tearful and anxious, at times will have rapid speech    ED Course  Procedures  DIAGNOSTIC STUDIES: Oxygen Saturation is 99% on room air, normal by  my interpretation.    COORDINATION OF CARE: 2:40PM - Patient informed of current plan for treatment and evaluation and agrees with plan at this time.  3:30 PM Previous records indicated "history of mental illness" but pt reports no recent followup with psychiatrist  She appears anxious, intermittently tearful, and appears disheveled with some pressured speech.  Could have some signs of mania.  I will consult telepsych for further guidance.  She is currently without SI and her "overdose" of amoxicillin is unlikely to have been harmful.  D/w dr zammit to f/u on labs and telepsych recommendations        MDM  Nursing notes reviewed and considered in documentation Previous records reviewed and considered    Date: 01/30/2012  Rate: 82  Rhythm: normal sinus  rhythm  QRS Axis: normal  Intervals: normal  ST/T Wave abnormalities: normal  Conduction Disutrbances:none        I personally performed the services described in this documentation, which was scribed in my presence. The recorded information has been reviewed and considered.      Joya Gaskins, MD 01/30/12 [phone]

## 2012-01-30 NOTE — ED Provider Notes (Signed)
The telepsyc stated the pt could be discharged.  Pt will be referred to day mark  [name], MD 01/30/12 [phone]

## 2012-02-02 NOTE — ED Provider Notes (Signed)
Medical screening examination/treatment/procedure(s) were performed by non-physician practitioner and as supervising physician I was immediately available for consultation/collaboration.  Valen Mascaro R. Ethanjames Fontenot, MD 02/02/12 1345 

## 2012-03-17 ENCOUNTER — Encounter (HOSPITAL_COMMUNITY): Payer: Self-pay | Admitting: *Deleted

## 2012-03-17 ENCOUNTER — Emergency Department (HOSPITAL_COMMUNITY)
Admission: EM | Admit: 2012-03-17 | Discharge: 2012-03-18 | Disposition: A | Discharge status: Other Acute Inpatient Hospital | Payer: 59 | Source: Home / Self Care | Attending: Emergency Medicine | Admitting: Emergency Medicine

## 2012-03-17 DIAGNOSIS — F29 Unspecified psychosis not due to a substance or known physiological condition: Secondary | ICD-10-CM

## 2012-03-17 LAB — URINALYSIS, ROUTINE W REFLEX MICROSCOPIC
Glucose, UA: NEGATIVE mg/dL
Urobilinogen, UA: 0.2 mg/dL (ref 0.0–1.0)

## 2012-03-17 LAB — DIFFERENTIAL
Basophils Absolute: 0.1 10*3/uL (ref 0.0–0.1)
Basophils Relative: 1 % (ref 0–1)
Neutro Abs: 3.4 10*3/uL (ref 1.7–7.7)
Neutrophils Relative %: 71 % (ref 43–77)

## 2012-03-17 LAB — CBC
MCHC: 33.4 g/dL (ref 30.0–36.0)
Platelets: 236 10*3/uL (ref 150–400)
RDW: 15.7 % — ABNORMAL HIGH (ref 11.5–15.5)

## 2012-03-17 LAB — BASIC METABOLIC PANEL
Chloride: 100 mEq/L (ref 96–112)
GFR calc Af Amer: >90 mL/min (ref >90)
Potassium: 3.1 mEq/L — ABNORMAL LOW (ref 3.5–5.1)

## 2012-03-17 LAB — RAPID URINE DRUG SCREEN, HOSP PERFORMED
Benzodiazepines: NOT DETECTED
Cocaine: NOT DETECTED

## 2012-03-17 LAB — URINE MICROSCOPIC-ADD ON

## 2012-03-17 LAB — PREGNANCY, URINE: Preg Test, Ur: NEGATIVE

## 2012-03-17 LAB — ETHANOL: Alcohol, Ethyl (B): <11 mg/dL (ref 0–11)

## 2012-03-17 MED ORDER — ALUM & MAG HYDROXIDE-SIMETH 200-200-20 MG/5ML PO SUSP: 30.0000 mL | ORAL | Status: DC | PRN

## 2012-03-17 MED ORDER — POTASSIUM CHLORIDE 20 MEQ PO PACK
40.0000 meq | PACK | Freq: Once | ORAL | Status: AC
  Administered 2012-03-17: 40 meq via ORAL
  Filled 2012-03-17: qty 2

## 2012-03-17 MED ORDER — SULFAMETHOXAZOLE-TMP DS 800-160 MG PO TABS
1.0000 | ORAL_TABLET | Freq: Once | ORAL | Status: AC
  Administered 2012-03-17: 1 via ORAL
  Filled 2012-03-17: qty 1

## 2012-03-17 MED ORDER — LORAZEPAM 1 MG PO TABS
1.0000 mg | ORAL_TABLET | Freq: Three times a day (TID) | ORAL | Status: DC | PRN
  Administered 2012-03-17: 1 mg via ORAL
  Filled 2012-03-17: qty 1

## 2012-03-17 MED ORDER — ZOLPIDEM TARTRATE 5 MG PO TABS: 5.0000 mg | ORAL_TABLET | Freq: Every evening | ORAL | Status: DC | PRN

## 2012-03-17 MED ORDER — IBUPROFEN 400 MG PO TABS: 600.0000 mg | ORAL_TABLET | Freq: Three times a day (TID) | ORAL | Status: DC | PRN

## 2012-03-17 MED ORDER — ONDANSETRON HCL 4 MG PO TABS: 4.0000 mg | ORAL_TABLET | Freq: Three times a day (TID) | ORAL | Status: DC | PRN

## 2012-03-17 MED ORDER — ACETAMINOPHEN 325 MG PO TABS: 650.0000 mg | ORAL_TABLET | ORAL | Status: DC | PRN

## 2012-03-17 NOTE — ED Notes (Signed)
Per EMS - called out for dry mouth, "white throat from ? Cancer", painful urination, vomiting, and back pain.  Pt's mother also reported to EMS that pt has not eaten in 1 wk.  Pt reports "God is punishing me for all the mnemonic stuff I've been doing."  Pt crying stating she has lumps and bumps all over her body from a dog bite in December of 2012.  Pt tearful at this time stating, " I shouldn't have gotten involved in mnemonic stuff because karma comes back on you."

## 2012-03-17 NOTE — ED Notes (Signed)
Pt tearful denies si and hi. Pt states she is under demonic attacks d/t a witchcraft spell she did in October.
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
## 2012-03-17 NOTE — ED Notes (Signed)
Spoke with Kayla [name] from ACT team. Recommendation from Dr. Dan Humphreys at [facility] to recheck and treat potassium and UTI and they will re-evaluate in am
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
## 2012-03-18 ENCOUNTER — Inpatient Hospital Stay (HOSPITAL_COMMUNITY)
Admission: AD | Admit: 2012-03-18 | Discharge: 2012-03-26 | DRG: 885 | Disposition: A | Discharge status: Home / Self Care | Payer: 59 | Source: Ambulatory Visit | Attending: Psychiatry | Admitting: Psychiatry

## 2012-03-18 DIAGNOSIS — R000 Tachycardia, unspecified: Secondary | ICD-10-CM | POA: Diagnosis present

## 2012-03-18 DIAGNOSIS — R634 Abnormal weight loss: Secondary | ICD-10-CM | POA: Diagnosis present

## 2012-03-18 DIAGNOSIS — Z6827 Body mass index (BMI) 27.0-27.9, adult: Secondary | ICD-10-CM

## 2012-03-18 DIAGNOSIS — F4310 Post-traumatic stress disorder, unspecified: Secondary | ICD-10-CM | POA: Diagnosis present

## 2012-03-18 DIAGNOSIS — F22 Delusional disorders: Principal | ICD-10-CM | POA: Diagnosis present

## 2012-03-18 DIAGNOSIS — F29 Unspecified psychosis not due to a substance or known physiological condition: Secondary | ICD-10-CM | POA: Diagnosis present

## 2012-03-18 DIAGNOSIS — R002 Palpitations: Secondary | ICD-10-CM

## 2012-03-18 DIAGNOSIS — N390 Urinary tract infection, site not specified: Secondary | ICD-10-CM | POA: Diagnosis present

## 2012-03-18 DIAGNOSIS — B370 Candidal stomatitis: Secondary | ICD-10-CM | POA: Diagnosis present

## 2012-03-18 LAB — URINE CULTURE
Colony Count: NO GROWTH
Culture  Setup Time: 201306051830

## 2012-03-18 MED ORDER — TRAZODONE HCL 100 MG PO TABS
100.0000 mg | ORAL_TABLET | Freq: Every evening | ORAL | Status: DC | PRN
Start: 2012-03-18 — End: 2012-03-26
  Filled 2012-03-18: qty 14

## 2012-03-18 MED ORDER — RISPERIDONE 2 MG PO TBDP
2.0000 mg | ORAL_TABLET | Freq: Every day | ORAL | Status: DC
  Administered 2012-03-18: 2 mg via ORAL
  Filled 2012-03-18 (×4): qty 1

## 2012-03-18 MED ORDER — POTASSIUM CHLORIDE CRYS ER 20 MEQ PO TBCR
60.0000 meq | EXTENDED_RELEASE_TABLET | Freq: Once | ORAL | Status: AC
  Administered 2012-03-18: 60 meq via ORAL
  Filled 2012-03-18: qty 3

## 2012-03-18 MED ORDER — ACETAMINOPHEN 325 MG PO TABS: 650.0000 mg | ORAL_TABLET | Freq: Four times a day (QID) | ORAL | Status: DC | PRN

## 2012-03-18 MED ORDER — ALUM & MAG HYDROXIDE-SIMETH 200-200-20 MG/5ML PO SUSP: 30.0000 mL | ORAL | Status: DC | PRN

## 2012-03-18 MED ORDER — MAGNESIUM HYDROXIDE 400 MG/5ML PO SUSP: 30.0000 mL | Freq: Every day | ORAL | Status: DC | PRN

## 2012-03-18 MED ORDER — CIPROFLOXACIN HCL 500 MG PO TABS
500.0000 mg | ORAL_TABLET | Freq: Two times a day (BID) | ORAL | Status: AC
  Administered 2012-03-18 – 2012-03-23 (×10): 500 mg via ORAL
  Filled 2012-03-18 (×13): qty 1

## 2012-03-18 NOTE — Progress Notes (Signed)
0040 Patient has been accepted at [facility] for tomorrow, pending improvement in potassium. She has been given 40 mEq. 0622 Repeat potassium is 4.2 after having received 40 mEq yesterday and 60 mEq today. No behavioral issues overnight.

## 2012-03-18 NOTE — ED Notes (Signed)
Bed not available at [facility] at this time, charge nurse and EDP notified.

## 2012-03-18 NOTE — ED Provider Notes (Signed)
2:20 PM patient alert ambulatory plan transfer behavioral health hospital Dr. Betti Cruz accepting physician  Doug Sou, MD 03/18/12 1426

## 2012-03-18 NOTE — ED Notes (Signed)
Pt c/o pain to the back of the head related to her brain cancer.

## 2012-03-18 NOTE — Progress Notes (Signed)
Patient ID: Kayla [name], female   DOB: 1971/01/01, 41 y.o.   MRN: [id number] Pt admitted on involuntary basis, pt states that her mother called 911 and the ambulance picked her up because her eyes were rolling back into her head. Pt, on admission, states that she has been having various medical problems since her dog bit her in December. Pt states that she has lost 160 lbs, that she has cancer, that her urine has turned orange, and has various bumps on her body and relates all this to the dog biting her. Pt does know that she is at behavioral health and was oriented to all spheres. Pt states that she is on no medications on admission and also states that she does not do any illicit drugs. Pt does complain of burning on urination on admission, pt does state that she lives with her mother and her 3 children in [place] and is able to discharge back there. Pt also stated on admission that she wants a faith healer to cure her of her sickness. Pt denies any SI/HI and was oriented to the unit and safety maintained
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
## 2012-03-18 NOTE — ED Notes (Signed)
Pt complains of feeling like something is in her throat.  She states this has been going on for 7 months.  She says she wants ENT doctor called because she was told we could call them if it was an emergency I informed her I would have Dr. Colon Branch come and speak with her.  There is no obvious distress such as stridor noted and patient appears to be breathing without difficulty.  Dr. Colon Branch notified of the above.  Vitals signs are WDL.  Nursing staff to continue to monitor.
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
## 2012-03-18 NOTE — Progress Notes (Signed)
Pt weight in February, from an office visit, was 191 lbs

## 2012-03-18 NOTE — ED Provider Notes (Signed)
7:20 AM patient resting comfortably alert patient states "I don't feel too good right now . I got not so over my body and I've been exposed to much radiation.". Patient is felt to be psychotic and continues to be in need of inpatient psychiatric treatment  Doug Sou, MD 03/18/12 [phone]

## 2012-03-19 DIAGNOSIS — F29 Unspecified psychosis not due to a substance or known physiological condition: Secondary | ICD-10-CM

## 2012-03-19 DIAGNOSIS — F4310 Post-traumatic stress disorder, unspecified: Secondary | ICD-10-CM

## 2012-03-19 MED ORDER — PROPRANOLOL HCL 10 MG PO TABS: 10.0000 mg | ORAL_TABLET | ORAL | Status: DC

## 2012-03-19 MED ORDER — RISPERIDONE 2 MG PO TBDP
4.0000 mg | ORAL_TABLET | Freq: Every day | ORAL | Status: DC
  Administered 2012-03-19 – 2012-03-25 (×7): 4 mg via ORAL
  Filled 2012-03-19 (×5): qty 2
  Filled 2012-03-19: qty 28
  Filled 2012-03-19: qty 4
  Filled 2012-03-19 (×4): qty 2

## 2012-03-19 MED ORDER — PROPRANOLOL HCL 10 MG PO TABS
10.0000 mg | ORAL_TABLET | ORAL | Status: DC
  Administered 2012-03-20 – 2012-03-23 (×7): 10 mg via ORAL
  Filled 2012-03-19 (×12): qty 1

## 2012-03-19 MED ORDER — ENSURE COMPLETE PO LIQD
237.0000 mL | Freq: Every day | ORAL | Status: DC
  Administered 2012-03-20 – 2012-03-22 (×3): 237 mL via ORAL

## 2012-03-19 MED ORDER — CITALOPRAM HYDROBROMIDE 20 MG PO TABS
20.0000 mg | ORAL_TABLET | Freq: Every day | ORAL | Status: DC
  Administered 2012-03-19 – 2012-03-26 (×8): 20 mg via ORAL
  Filled 2012-03-19 (×3): qty 1
  Filled 2012-03-19: qty 14
  Filled 2012-03-19 (×8): qty 1

## 2012-03-19 NOTE — Tx Team (Signed)
Interdisciplinary Treatment Plan Update (Adult)  Date:  03/19/2012  Time Reviewed:  10:15AM-11:15AM  Progress in Treatment: Attending groups:  Yes Participating in groups:    Yes Taking medication as prescribed:    New patient Tolerating medication:   N/A Family/Significant other contact made:  No, has given permission to contact family Patient understands diagnosis:  No  Discussing patient identified problems/goals with staff:   Yes, very concerned about physical symptoms Medical problems stabilized or resolved:   No, needs a consult with internal medicine Denies suicidal/homicidal ideation: No, "but if I find out I have cancer I will be suicidal" Issues/concerns per patient self-inventory:   None Other:    New problem(s) identified: No, Describe:    Reason for Continuation of Hospitalization: Anxiety Delusions  Depression Medical Issues Medication stabilization Other; describe hopelessness  Interventions implemented related to continuation of hospitalization:  Medication monitoring and adjustment, safety checks Q15 min., suicide risk assessment, group therapy, psychoeducation, collateral contact, aftercare planning, ongoing physician assessments, medication education  Additional comments:  Not applicable  Estimated length of stay:  5-7 days  Discharge Plan:  Will return to live with her mother and children, follow up to be arranged  New goal(s):  Not applicable  Review of initial/current patient goals per problem list:   1.  Goal(s):  Deny SI for 48 hours prior to D/C.  Met:  No  Target date:  By Discharge   As evidenced by:  Denies today, but says she will be suicidal if finds out she has cancer  2.  Goal(s):  Reduce depression, anxiety, and hopelessness to no greater than 3 at discharge.  Met:  No  Target date:  By Discharge   As evidenced by:  "There won't be no hope for me anyway.  If I find out I have cancer, I'll be suicidal."  3.  Goal(s):  Medication  stabilization  Met:  No  Target date:  By Discharge   As evidenced by:  New patient  4.  Goal(s):  Reduce psychotic symptoms to baseline per family and patient report.  Met:  No  Target date:  By Discharge   As evidenced by:  Still focused on the physical elements to the point of panic  Attendees: Patient:  Kayla [name]  03/19/2012 10:15AM-11:15AM  Family:     Physician:  Dr. Harvie Heck Readling 03/19/2012 10:15AM-11:15AM  Nursing:   Barrie Folk, RN 03/19/2012 10:15AM -11:15AM   Case Manager:  Ambrose Mantle, LCSW 03/19/2012 10:15AM-11:15AM  Counselor:  Veto Kemps, MT-BC 03/19/2012 10:15AM-11:15AM  Other:   Omelia Blackwater, RN 03/19/2012 10:15AM-11:15AM  Other:      Other:      Other:       Scribe for Treatment Team:   Sarina Ser, 03/19/2012, 10:15AM-11:15AM

## 2012-03-19 NOTE — Progress Notes (Addendum)
Patient states that her sleep was poor last night, appetite poor, states that she did not eat breakfast this AM. Patient did not rate her depression for writer but did rate her sense of hopelessness as 8/10. Patient remains focused on her declining health. Patient endorsing religious delusions. Patient encouraged to participate in groups today and to report medical concerns to PA and MD. Patient encouraged to take a shower and wash her hair today. MHT's requested to assist patient with hygiene this AM.

## 2012-03-19 NOTE — Progress Notes (Signed)
03/19/2012         Time: 0930      Group Topic/Focus: The focus of the group is on enhancing the patients' ability to cope with stressors by understanding what coping is, why it is important, the negative effects of stress and developing healthier coping skills. Patients practice [place] and discuss how exercise can be used as a healthy coping strategy.  Participation Level: None  Participation Quality: Resistant  Affect: Labile  Cognitive: Confused  Additional Comments: Patient reports she believes she needs a faith healer to come cure her cancer. Patient encouraged to participate in the relaxation activity as it may be helpful for her, patient declined, reported she was going to vomit, and left group.    Alejo Beamer 03/19/2012 1:16 PM

## 2012-03-19 NOTE — Discharge Planning (Signed)
Met with patient in Aftercare Planning Group.   She was tearful and anxious throughout, stating that she has gone from 210 pounds in December to 130 pounds now, after being bitten by a dog.  Her thoughts were tangential, hard to follow.  She is preoccupied with her health and appears to have some level of psychosis associated with health concerns, although it is not known which, if any, of these concerns have been ruled out through previous care already.  Patient reports that she will return at discharge to live with her mother and 3 children who are 42, 61, and 77 years of age.  Patient also states they will be able to pick her up and transport her home.  She has not seen a psychiatrist, and has no current providers for medication management or therapy.  She sees Dr. Juanetta Gosling, primary care physician in [place].  She states it has taken him 7 months to set an appointment to see an ENT specialist.  She also reports that her throat has been sore for 7 months.  Her father died of cancer, and she is preoccupied with this also.  Not missing any appointments today.  Ambrose Mantle, LCSW 03/19/2012, 9:52 AM

## 2012-03-19 NOTE — [facility] Suicide Risk Assessment (Signed)
Suicide Risk Assessment  Admission Assessment     Demographic factors:  Assessment Details Time of Assessment: Admission Information Obtained From: Patient Current Mental Status:    Loss Factors:  Loss Factors: Decline in physical health Historical Factors:    Risk Reduction Factors:  Risk Reduction Factors: Responsible for children under 40 years of age;Sense of responsibility to family;Living with another person, especially a relative  CLINICAL FACTORS:   Depression:   Delusional Schizophrenia:   Depressive state Medical Diagnoses and Treatments/Surgeries  COGNITIVE FEATURES THAT CONTRIBUTE TO RISK:  Closed-mindedness Loss of executive function Polarized thinking Thought constriction (tunnel vision)    Diagnosis:  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   The patient was seen today and reports the following:   ADL's: Intact.  Sleep: The patient reports trouble initiating and maintaining sleep.  Appetite: The patient reports a poor appetite with an "80 pound" weight loss in the last 6 months.   Mild>(1-10) >Severe  Hopelessness (1-10): 9  Depression (1-10): 3  Anxiety (1-10): 9   Suicidal Ideation: The patient denies any suicidal ideations today.  Plan: No  Intent: No  Means: No   Homicidal Ideation: The patient denies any homicidal ideations today.  Plan: No  Intent: No.  Means: No   General Appearance/Behavior: The patient was cooperative today with this provider.  Eye Contact: Good.  Speech: Appropriate in rate and volume with no pressuring noted today.  Motor Behavior: wnl.  Level of Consciousness: Alert and Oriented x 3.  Mental Status: Alert and Oriented x 3.  Mood: Moderately Depressed.  Affect: Moderately Constricted.  Anxiety Level: Moderate anxiety reported today.  Thought Process: Delusional in her thinking.  Thought Content: The patient denies any auditory or visual hallucinations today. She reports significant somatic delusions today.    Perception: Delusional in her thinking.  Judgment: Poor.  Insight: Poor.  Cognition: Oriented to person, place and time.   Review of Systems:  Neurological: The patient denies any headaches today. She denies any seizures or dizziness.  G.I.: The patient denies any constipation but reports G.I. Upset today.  Musculoskeletal: The patient denies any muscle or skeletal difficulties.   Time was spent today discussing with the patient her reason for admission.  The patient states that she was bitten by her dog in December 2012 and also attempted to place a love spell on a boyfriend.  She states the love spell worked against her and with the dog bite she has become physically ill.  She states she needs a "faith healer" to cure her stating that doctors are making her worse.  As an example, she states she has undergone numerous imaging studies over the last 6 months and feels she has been given cancer from the radiation exposure.  Currently she reports to having difficulty initiating and maintaining sleep as well as reports a decreased appetite with an 80 lb weight loss in the last 6 months.  The patient reports moderate feelings of sadness, anhedonia and depressed mood and denies any suicidal or homicidal ideations.  The patient reports severe anxiety symptoms.  She denies any auditory or visual hallucinations but displays significant somatic delusions.   Treatment Plan Summary:  1. Daily contact with patient to assess and evaluate symptoms and progress in treatment.  2. Medication management  3. The patient will deny suicidal ideations or homicidal ideations for 48 hours prior to discharge and have a depression and anxiety rating of 3 or less. The patient will also deny any auditory  or visual hallucinations or delusional thinking.  4. The patient will deny any symptoms of substance withdrawal at time of discharge.   Plan:  1. Will continue the medication Risperdal at the increase dosage of 4 mgs po  qhs for her somatic delusions.  2. Will start the medication Celexa at 20 mgs po q am for depression. 3. Will continue the medication Cipro at 500 mgs po qam and hs x 5 days for a UTI, 4. Will start the medication Propranolol at 10 mgs po q am and hs for tachycardia. 5. Laboratory Studies reviewed. 6. Will continue to monitor.  SUICIDE RISK:   Minimal: No identifiable suicidal ideation.  Patients presenting with no risk factors but with morbid ruminations; may be classified as minimal risk based on the severity of the depressive symptoms  Kayla [name] 03/19/2012, 7:10 PM

## 2012-03-19 NOTE — Progress Notes (Signed)
Pt asleep at this time. Pt appears to be in no signs of distress at this time. Respirations even and unlabored. Pt remains safe with q15min checks.  

## 2012-03-19 NOTE — Progress Notes (Signed)
Brief Nutrition Note  Reason: Patient screened nutrition risk for unintentional weight loss > 10 lb over 1 month  Patient reports weight loss since December because she believes she has cancer. Patient is not eating. She reported she is not eating because eating causes her to vomit. Patient reported she does not have a problem drinking liquids. Patient requests to speak to a chaplain.   I encouraged the patient to eat foods and the importance of healthy intake. Patient agreed to drink [employer].   Wt Readings from Last 10 Encounters:  03/18/12 152 lb (68.947 kg)  01/30/12 180 lb (81.647 kg)  01/11/12 178 lb (80.74 kg)  12/27/11 176 lb 12.9 oz (80.2 kg)  12/20/11 181 lb (82.101 kg)  12/10/11 191 lb (86.637 kg)  11/28/11 191 lb (86.637 kg)  11/21/11 199 lb 6 oz (90.436 kg)  10/26/11 210 lb (95.255 kg)  10/02/11 221 lb (100.245 kg)  *Weight loss of 58 lb. over 5 months,  27% from baseline.   I have ordered the patient Vanilla Ensure once daily to promote increased caloric and protein intake.   RD available for nutrition needs.   Iven Finn [facility] [phone]

## 2012-03-20 NOTE — Progress Notes (Signed)
Pt continues to be guarded and suspicious of being at the hospital   She said the er nurse gave her amphetamines and that is why her drug screen showed positive for them   She is depressed and anxious  But does not appear to have auditory or visual hallucinations   She reports strong desire to be discharged and does not understand why we are keeping her here   She also denies suicidal and homicidal ideation  She has many somatic complaints and believes she has medical problems that are unsubstancesiated   Verbal support given  Medications administered and effectiveness monitored   Q 15 min checks  Pt safe at present

## 2012-03-20 NOTE — Progress Notes (Signed)
[facility] MD Progress Note  03/20/2012 12:15 PM  Diagnosis:  Axis I: Psychotic Disorder NOS  ADL's:  Intact  Sleep: Fair  Appetite:  Fair  Ms. Desrosiers immediately began discussing her multiple somatic concerns this morning, mentioning that she has had "orange urine" since Christmas and says that her throat has been hurting for a long time. She believes that she has cancer. She feels that she is being "drugged" here and asks to be discharged so that she can go to outside appointments, which she states are on 6/10 and 6/13. She denies side effects from current medications. Reports that she has difficulty sleeping because of her physical ailments.  Mental Status Examination/Evaluation: Objective:  Appearance: Casual and Fairly Groomed  Eye Contact::  Fair  Speech:  Clear and Coherent and Normal Rate  Volume:  Normal  Mood: "not good"  Affect:  Constricted  Thought Process:  Goal Directed and Linear  Orientation:  Full  Thought Content: Denies SI/HI. Has multiple somatic concerns as above. Denies AVH and is not responding to internal stimuli during the interview.  Suicidal Thoughts:  No  Homicidal Thoughts:  No  Judgement:  Poor  Insight:  Lacking  Psychomotor Activity:  Normal  Concentration:  Fair  Sleep:  Number of Hours: 6.5    Vital Signs:Blood pressure 104/72, pulse 123, temperature 98.1 F (36.7 C), temperature source Oral, resp. rate 16, height 5\' 2"  (1.575 m), weight 152 lb (68.947 kg). Current Medications: Current Facility-Administered Medications  Medication Dose Route Frequency Provider Last Rate Last Dose  . acetaminophen (TYLENOL) tablet 650 mg  650 mg Oral Q6H PRN Curlene Labrum Readling, MD      . alum & mag hydroxide-simeth (MAALOX/MYLANTA) 200-200-20 MG/5ML suspension 30 mL  30 mL Oral Q4H PRN Curlene Labrum Readling, MD      . ciprofloxacin (CIPRO) tablet 500 mg  500 mg Oral BID Mickie D. Adams, PA   500 mg at 03/20/12 0820  . citalopram (CELEXA) tablet 20 mg  20 mg Oral Daily Curlene Labrum  Readling, MD   20 mg at 03/20/12 0820  . feeding supplement (ENSURE COMPLETE) liquid 237 mL  237 mL Oral QAC supper Anastasio Champion, RD      . magnesium hydroxide (MILK OF MAGNESIA) suspension 30 mL  30 mL Oral Daily PRN Curlene Labrum Readling, MD      . propranolol (INDERAL) tablet 10 mg  10 mg Oral BH-qamhs Curlene Labrum Readling, MD   10 mg at 03/20/12 0820  . risperiDONE (RISPERDAL M-TABS) disintegrating tablet 4 mg  4 mg Oral QHS Curlene Labrum Readling, MD   4 mg at 03/19/12 2151  . traZODone (DESYREL) tablet 100 mg  100 mg Oral QHS PRN Ronny Bacon, MD      . DISCONTD: propranolol (INDERAL) tablet 10 mg  10 mg Oral BH-qamhs Randy D Readling, MD      . DISCONTD: risperiDONE (RISPERDAL M-TABS) disintegrating tablet 2 mg  2 mg Oral QHS Curlene Labrum Readling, MD   2 mg at 03/18/12 2026    Lab Results: No results found for this or any previous visit (from the past 48 hour(s)).  Treatment Plan Summary: Daily contact with patient to assess and evaluate symptoms and progress in treatment Medication management  Plan: 1. Continue medications per primary team. 2. Continue q15 minute observation for safety 3. Continue to encourage participation in groups  Eligah East 03/20/2012, 12:15 PM

## 2012-03-20 NOTE — Progress Notes (Signed)
Patient ID: Kayla [name], female   DOB: [birth year], 41 y.o.   MRN: [id number]   [facility] Group Notes:  (Counselor/Nursing/MHT/Case Management/Adjunct)  03/20/2012 11 AM  Type of Therapy:  Aftercare Planning, Group Therapy, Dance/Movement Therapy   Participation Level:  Did Not Attend   Cassidi Long

## 2012-03-21 NOTE — Progress Notes (Signed)
Patient ID: Kayla [name], female   DOB: 09-Jul-1971, 42 y.o.   MRN: [id number]   [facility] Group Notes:  (Counselor/Nursing/MHT/Case Management/Adjunct)  03/21/2012 11 AM  Type of Therapy:  Aftercare Planning, Group Therapy, Dance/Movement Therapy   Participation Level:  Did Not Attend     Cassidi Long

## 2012-03-21 NOTE — Progress Notes (Signed)
Pt is anxious and depressed   She isolates and appears fearful   She has not been eating and has been encouraged to do so  She complained of pain in her midsection   She also thinks she has cancer  She did reveal to this writer she feels guilty over participating in some witch craft   She at first said she started loosing weight after her dog bit her but then she said it was after doing the witchcraft  Pt denies taking any dietary supplements that would make her test positive for amphetamines   Verbal support given  Reassure pt and instructed on depression and anxiety    Medications administered and effectiveness monitored  Q 15 min checks  Pt safe at present

## 2012-03-21 NOTE — Progress Notes (Signed)
Patient ID: Kayla [name], female   DOB: Aug 23, 1971, 41 y.o.   MRN: [id number] [facility] MD Progress Note  03/21/2012 11:51 AM  Diagnosis:  Axis I: Psychotic Disorder NOS  ADL's:  Intact  Sleep: Fair  Appetite:  Fair  Ms. Dykes immediately began discussing her multiple somatic concerns again this morning, stating that she has been losing weight since December, and thinks she is going to die. Reports not sleeping because of these worries. Reports feeling fatigued. Denies SI/HI, AVH, or medication side effects.  Mental Status Examination/Evaluation: Objective:  Appearance: Casual and Fairly Groomed  Eye Contact::  Fair  Speech:  Clear and Coherent and Normal Rate  Volume:  Normal  Mood: "not good"  Affect:  Constricted  Thought Process:  Goal Directed and Linear  Orientation:  Full  Thought Content: Denies SI/HI. Has multiple somatic concerns as above. Denies AVH and is not responding to internal stimuli during the interview.  Suicidal Thoughts:  No  Homicidal Thoughts:  No  Judgement:  Poor  Insight:  Lacking  Psychomotor Activity:  Normal  Concentration:  Fair  Sleep:  Number of Hours: 6.5    Vital Signs:Blood pressure 118/85, pulse 62, temperature 97.3 F (36.3 C), temperature source Oral, resp. rate 16, height 5\' 2"  (1.575 m), weight 152 lb (68.947 kg). Current Medications: Current Facility-Administered Medications  Medication Dose Route Frequency Provider Last Rate Last Dose  . acetaminophen (TYLENOL) tablet 650 mg  650 mg Oral Q6H PRN Curlene Labrum Readling, MD      . alum & mag hydroxide-simeth (MAALOX/MYLANTA) 200-200-20 MG/5ML suspension 30 mL  30 mL Oral Q4H PRN Curlene Labrum Readling, MD      . ciprofloxacin (CIPRO) tablet 500 mg  500 mg Oral BID Mickie D. Adams, PA   500 mg at 03/21/12 0740  . citalopram (CELEXA) tablet 20 mg  20 mg Oral Daily Curlene Labrum Readling, MD   20 mg at 03/21/12 0740  . feeding supplement (ENSURE COMPLETE) liquid 237 mL  237 mL Oral QAC supper Anastasio Champion, RD   237 mL at 03/20/12 1811  . magnesium hydroxide (MILK OF MAGNESIA) suspension 30 mL  30 mL Oral Daily PRN Curlene Labrum Readling, MD      . propranolol (INDERAL) tablet 10 mg  10 mg Oral BH-qamhs Curlene Labrum Readling, MD   10 mg at 03/21/12 0740  . risperiDONE (RISPERDAL M-TABS) disintegrating tablet 4 mg  4 mg Oral QHS Curlene Labrum Readling, MD   4 mg at 03/20/12 2102  . traZODone (DESYREL) tablet 100 mg  100 mg Oral QHS PRN Ronny Bacon, MD        Lab Results: No results found for this or any previous visit (from the past 48 hour(s)).  Treatment Plan Summary: Daily contact with patient to assess and evaluate symptoms and progress in treatment Medication management  Plan: 1. Continue medications per primary team. 2. Continue q15 minute observation for safety 3. Continue to encourage participation in groups  Eligah East 03/21/2012, 11:51 AM

## 2012-03-21 NOTE — Progress Notes (Signed)
Patient ID: Kayla [name], female   DOB: 1971/02/10, 41 y.o.   MRN: [id number] Pt. Eyes closed, resp, even, unlabored. Staff will monitor q49min for safety.

## 2012-03-22 DIAGNOSIS — F22 Delusional disorders: Principal | ICD-10-CM

## 2012-03-22 MED ORDER — MIRTAZAPINE 15 MG PO TABS
15.0000 mg | ORAL_TABLET | Freq: Every day | ORAL | Status: DC
  Administered 2012-03-22 – 2012-03-25 (×4): 15 mg via ORAL
  Filled 2012-03-22 (×2): qty 1
  Filled 2012-03-22: qty 14
  Filled 2012-03-22 (×4): qty 1

## 2012-03-22 NOTE — Progress Notes (Signed)
Patient in room this afternoon. Appears depressed. Denies SI. Only complaint is feeling "weak from medical problems." MHT reports that patient ate 30% of her lunch. Talked to patient about how not eating could cause weakness aside from any potential medical problems that could be present. The patient appeared to understand this and promised to eat a portion of dinner tonight. Patient informed case management of medical appointments that she needs to go to tomorrow. The patient shows no insight into her mental health problems. Patient reports not eating well since December 2012. Is taking fluids. Denies having any appetite. Remains safe on every fifteen minute safety checks.

## 2012-03-22 NOTE — Discharge Planning (Signed)
Met with patient in Aftercare Planning Group.   She remained focused on her somatic complaints, and said "I have medical issues, not mental health issues."  Patient states that she has appointments tomorrow 6/11 with bladder doctor Dr. Jerre Simon and on Thursday 6/13 with ENT doctor Dr. Lucienne Capers.  She gets to her appointments through [facility] transportation with the [facility].  She reports her throat remains sore.    She states that her mother is upset at this time with her daughter, because she has moved out while her mother has been in the hospital.    Utilization review due today to request additional days.  Ambrose Mantle, LCSW 03/22/2012, 9:54 AM

## 2012-03-22 NOTE — Progress Notes (Signed)
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   The patient was seen today and reports the following:   ADL's: Intact.  Sleep: The patient reports to sleeping well last night.  Appetite: The patient reports an ongoing poor appetite with an "80 pound" weight loss in the last 6 months.   Mild>(1-10) >Severe  Hopelessness (1-10): 0  Depression (1-10): 0  Anxiety (1-10): 0   Suicidal Ideation: The patient denies any suicidal ideations today.  Plan: No  Intent: No  Means: No   Homicidal Ideation: The patient denies any homicidal ideations today.  Plan: No  Intent: No.  Means: No   General Appearance/Behavior: The patient was cooperative today with this provider but remained preoccupied with her somatic concerns.  Eye Contact: Good.  Speech: Appropriate in rate and volume with no pressuring noted today.  Motor Behavior: wnl.  Level of Consciousness: Alert and Oriented x 3.  Mental Status: Alert and Oriented x 3.  Mood: Appears moderately depressed.  Affect: Moderately Constricted.  Anxiety Level: No anxiety reported today.  Thought Process: Delusional in her thinking.  Thought Content: The patient denies any auditory or visual hallucinations today. She reports ongoing significant somatic delusions today.  Perception: Delusional in her thinking.  Judgment: Poor.  Insight: Poor.  Cognition: Oriented to person, place and time.  Sleep:  Number of Hours: 6    Vital Signs:Blood pressure 97/65, pulse 118, temperature 99.3 F (37.4 C), temperature source Oral, resp. rate 20, height 5\' 2"  (1.575 m), weight 68.947 kg (152 lb).  Current Medications: Current Facility-Administered Medications  Medication Dose Route Frequency Provider Last Rate Last Dose  . acetaminophen (TYLENOL) tablet 650 mg  650 mg Oral Q6H PRN Curlene Labrum Haiden Clucas, MD      . alum & mag hydroxide-simeth (MAALOX/MYLANTA) 200-200-20 MG/5ML suspension 30 mL  30 mL Oral  Q4H PRN Curlene Labrum Madeline Bebout, MD      . ciprofloxacin (CIPRO) tablet 500 mg  500 mg Oral BID Mickie D. Adams, PA   500 mg at 03/22/12 0824  . citalopram (CELEXA) tablet 20 mg  20 mg Oral Daily Curlene Labrum Malyah Ohlrich, MD   20 mg at 03/22/12 0824  . feeding supplement (ENSURE COMPLETE) liquid 237 mL  237 mL Oral QAC supper Anastasio Champion, RD   237 mL at 03/22/12 1707  . magnesium hydroxide (MILK OF MAGNESIA) suspension 30 mL  30 mL Oral Daily PRN Curlene Labrum Javaris Wigington, MD      . mirtazapine (REMERON) tablet 15 mg  15 mg Oral QHS Curlene Labrum Arless Vineyard, MD      . propranolol (INDERAL) tablet 10 mg  10 mg Oral BH-qamhs Curlene Labrum Gusta Marksberry, MD   10 mg at 03/22/12 0824  . risperiDONE (RISPERDAL M-TABS) disintegrating tablet 4 mg  4 mg Oral QHS Curlene Labrum Gevork Ayyad, MD   4 mg at 03/21/12 2226  . traZODone (DESYREL) tablet 100 mg  100 mg Oral QHS PRN Ronny Bacon, MD       Lab Results: No results found for this or any previous visit (from the past 48 hour(s)).  Review of Systems:  Neurological: The patient denies any headaches today. She denies any seizures or dizziness.  G.I.: The patient denies any constipation but reports G.I. Upset today.  Musculoskeletal: The patient denies any muscle or skeletal difficulties.   Time was spent today discussing with the patient her current symptoms.  The patient states that she  slept well last night and reports an ongoing decreased appetite.  She exhibits moderate feelings of sadness, anhedonia and depressed mood and denies any suicidal or homicidal ideations.  She also denies any anxiety symptoms today.  The patient continues to deny any auditory or visual hallucinations but continues to display significant somatic delusions.  Treatment Plan Summary:  1. Daily contact with patient to assess and evaluate symptoms and progress in treatment.  2. Medication management  3. The patient will deny suicidal ideations or homicidal ideations for 48 hours prior to discharge and have a depression  and anxiety rating of 3 or less. The patient will also deny any auditory or visual hallucinations or delusional thinking.  4. The patient will deny any symptoms of substance withdrawal at time of discharge.   Plan:  1. Will continue the medication Risperdal at 4 mgs po qhs for her somatic delusions.  2. Will continue the medication Celexa at 20 mgs po q am for depression.  3. Will continue the medication Cipro at 500 mgs po qam and hs x 5 days for a UTI,  4. Will continue the medication Propranolol at 10 mgs po q am and hs for tachycardia.  5. Will start the medication Remeron at 15 mgs po qhs for sleep, appetite and depression. 6. Laboratory Studies reviewed.  7. Will continue to monitor. 8. Hospitalist Consult requested to evaluate the patient's somatic complaints.  Levette Paulick 03/22/2012, 5:20 PM

## 2012-03-22 NOTE — Progress Notes (Signed)
Patient ID: Kayla [name], female   DOB: 02/12/1971, 41 y.o.   MRN: [id number] The patient is resting in bed with eyes closed. No distress noted. Will continue to monitor q15 minutes for safety.

## 2012-03-22 NOTE — Progress Notes (Signed)
Pt denies SI/HI/AVH. Pt mood is depressed and anxious. Affect is anxious. Pt states that she is concerned that she might have cancer because her throat is hurting. Pt rates her depression, hopelessness, and anxiety all as 10. Support and encouragement offered.

## 2012-03-22 NOTE — [facility] Counselor (Signed)
Adult Comprehensive Assessment  Patient ID: Kayla [name], female   DOB: Mar 12, 1971, 41 y.o.   MRN: [id number]  Information Source: Information source: Patient  Current Stressors:  Educational / Learning stressors: 10th grade education Employment / Job issues: unemployed Family Relationships: no issues reported Surveyor, quantity / Lack of resources (include bankruptcy): [employer] / Lack of housing: lives with her mother Physical health (include injuries & life threatening diseases): patient believes she is dying of cancer like her father, also believes she will have a heart attack or stroke, pains in her head, hasn't been the same since biten by her dog in her face Social relationships: no social support other than mother Substance abuse: none reported Bereavement / Loss: no issues reported other than her health  Living/Environment/Situation:  Living Arrangements: Parent Living conditions (as described by patient or guardian): alright How long has patient lived in current situation?: living with her mother since divorce in 2004. What is atmosphere in current home: Comfortable  Family History:  Marital status: Divorced Divorced, when?: originally divorced in 1993 but got back together until 2004. Currently divorced What types of issues is patient dealing with in the relationship?: n/a Does patient have children?: Yes How many children?: 3  (Kayla [name]-age 44, Kayla [name] 20 and Kayla [name] 17) How is patient's relationship with their children?: okay  Childhood History:  By whom was/is the patient raised?: Both parents Additional childhood history information: Father died in 39 with lymph node cancer Description of patient's relationship with caregiver when they were a child: good Patient's description of current relationship with people who raised him/her: good Does patient have siblings?: Yes Number of Siblings: 3  (brothers) Description of patient's current relationship with siblings:  good Did patient suffer any verbal/emotional/physical/sexual abuse as a child?: No Did patient suffer from severe childhood neglect?: No Has patient ever been sexually abused/assaulted/raped as an adolescent or adult?: No Was the patient ever a victim of a crime or a disaster?: No Witnessed domestic violence?: No Has patient been effected by domestic violence as an adult?: No  Education:  Highest grade of school patient has completed: 10th grade Currently a student?: No Learning disability?: No  Employment/Work Situation:   Employment situation: Unemployed Patient's job has been impacted by current illness: No What is the longest time patient has a held a job?: since 2004 until current 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 Has patient ever been in the [employer]?: No Has patient ever served in Buyer, retail?: No  Financial Resources:   Surveyor, quantity resources: Medicaid Does patient have a Lawyer or guardian?: No  Alcohol/Substance Abuse:   What has been your use of drugs/alcohol within the last 12 months?: none reported If attempted suicide, did drugs/alcohol play a role in this?: No Alcohol/Substance Abuse Treatment Hx: Denies past history Has alcohol/substance abuse ever caused legal problems?: No  Social Support System:   Conservation officer, nature Support System: Fair Museum/gallery exhibitions officer System: mother Type of faith/religion: Baptist How does patient's faith help to cope with current illness?: I feel like God is punishing me with all these health problems.  Leisure/Recreation:   Leisure and Hobbies: use to do poetry, Psychologist, educational, Animator. Hasn't done anything since December 19th when she was attacked by dog  Strengths/Needs:   What things does the patient do well?: Patient could not identify any strengths. She stated I don't look the same or feel the same. In what areas does patient struggle / problems for patient: health problems  Discharge  Plan:   Does patient have access to transportation?: Yes (son) Will patient be returning to same living situation after discharge?: Yes Currently receiving community mental health services: No Does patient have financial barriers related to discharge medications?: No  Summary/Recommendations:   Summary and Recommendations (to be completed by the evaluator): Patient is a 41 year old white female with diagnosis of Psychotic D/O NOS. She was admitted due to being fearful, anxious, and delusional about numerous physical symptoms including cancer.  Patient will benefit from crisis stabilization, medication evaluation, group therapy and psychoeducation groups to work on coping skills, case management for referrals and counselor to contact family for collateral.  Kayla [name], Aram Beecham. 03/22/2012

## 2012-03-23 DIAGNOSIS — R634 Abnormal weight loss: Secondary | ICD-10-CM

## 2012-03-23 DIAGNOSIS — B370 Candidal stomatitis: Secondary | ICD-10-CM

## 2012-03-23 DIAGNOSIS — E782 Mixed hyperlipidemia: Secondary | ICD-10-CM

## 2012-03-23 DIAGNOSIS — R000 Tachycardia, unspecified: Secondary | ICD-10-CM

## 2012-03-23 LAB — COMPREHENSIVE METABOLIC PANEL
AST: 162 U/L — ABNORMAL HIGH (ref 0–37)
Albumin: 3.1 g/dL — ABNORMAL LOW (ref 3.5–5.2)
BUN: 10 mg/dL (ref 6–23)
Chloride: 102 mEq/L (ref 96–112)
Creatinine, Ser: 0.69 mg/dL (ref 0.50–1.10)
Potassium: 3.9 mEq/L (ref 3.5–5.1)
Total Bilirubin: 0.1 mg/dL — ABNORMAL LOW (ref 0.3–1.2)
Total Protein: 6.3 g/dL (ref 6.0–8.3)

## 2012-03-23 MED ORDER — PROPRANOLOL HCL 20 MG PO TABS
20.0000 mg | ORAL_TABLET | ORAL | Status: DC
  Administered 2012-03-24: 20 mg via ORAL
  Filled 2012-03-23 (×6): qty 1

## 2012-03-23 MED ORDER — FLUCONAZOLE 200 MG PO TABS
200.0000 mg | ORAL_TABLET | Freq: Once | ORAL | Status: AC
  Administered 2012-03-23: 200 mg via ORAL
  Filled 2012-03-23: qty 2
  Filled 2012-03-23: qty 1

## 2012-03-23 MED ORDER — FLUCONAZOLE 100 MG PO TABS
100.0000 mg | ORAL_TABLET | Freq: Every day | ORAL | Status: DC
  Administered 2012-03-24 – 2012-03-26 (×3): 100 mg via ORAL
  Filled 2012-03-23: qty 4
  Filled 2012-03-23 (×4): qty 1

## 2012-03-23 MED ORDER — ENSURE COMPLETE PO LIQD: 237.0000 mL | Freq: Three times a day (TID) | ORAL | Status: DC

## 2012-03-23 MED ORDER — FLUCONAZOLE IN SODIUM CHLORIDE 200-0.9 MG/100ML-% IV SOLN: 200.0000 mg | Freq: Once | INTRAVENOUS | Status: DC

## 2012-03-23 MED ORDER — HALOPERIDOL 1 MG PO TABS
1.0000 mg | ORAL_TABLET | Freq: Every day | ORAL | Status: DC
  Administered 2012-03-23 – 2012-03-24 (×2): 1 mg via ORAL
  Filled 2012-03-23 (×5): qty 1

## 2012-03-23 MED ORDER — ENSURE COMPLETE PO LIQD
237.0000 mL | Freq: Three times a day (TID) | ORAL | Status: DC
  Administered 2012-03-23 – 2012-03-26 (×8): 237 mL via ORAL

## 2012-03-23 NOTE — Progress Notes (Signed)
Patient remains seclusive to her room much of the day.  Does come out for medications, but does not engage in much conversation.  Did not want to get into the shower stating that she had sponged off at the sink earlier today.  When asked when she last had clean clothes she stated 2 or 3 days ago.  I encouraged her to allow [place] to wash them and to get into the shower or the bathtub, which she finally agreed to.  She has since showered and turned her clothes in for laundering.  She is taking her medications as prescribed and tolerating them well.  She did eat 40-50% of her breakfast this morning and is doing better with fluids today.  She was brought to the scale today and weighed exactly the same as she did the day she was admitted.  She did not eat any lunch today, but accepted an Ensure drink.

## 2012-03-23 NOTE — Consult Note (Signed)
Requesting physician: Dr Harvie Heck Reading.  Reason for consultation: To address somatic complaints.  History of Present Illness: This is a 41 year old female, admitted to [facility] on 03/18/12 for delusional ideas. In addition, has complaints of profound weight loss, dropping from 210 lbs to about 150 lbs since 09/1011, as well as progressive lower extremity swelling.  She has had a sore throat for the past one month, and is troubled by "fluttering sensation" in her chest. Primary team has diagnosed oral thrush on physical examination, finds the patient to be tachycardic, and would like the medical service to assist in management of medical issues. UDS on 03/17/12, was positive for amphetamines. EKG shows sinus tachycardia. TSH is pending.  Allergies:  No Known Allergies    Past Medical History  Diagnosis Date  . Ovarian cyst     cervical polyp  . Anxiety     Panic attacks; somatization  . Palpitations     possible bradycardia    Past Surgical History  Procedure Date  . None     Scheduled Meds:   . ciprofloxacin  500 mg Oral BID  . citalopram  20 mg Oral Daily  . feeding supplement  237 mL Oral QAC supper  . fluconazole  200 mg Oral Once  . mirtazapine  15 mg Oral QHS  . propranolol  10 mg Oral BH-qamhs  . risperiDONE  4 mg Oral QHS  . DISCONTD: fluconazole (DIFLUCAN) IV  200 mg Intravenous Once   Continuous Infusions:  PRN Meds:.acetaminophen, alum & mag hydroxide-simeth, magnesium hydroxide, traZODone  Social History:  reports that she has never smoked. She has never used smokeless tobacco. She reports that she does not drink alcohol or use illicit drugs.  Family History  Problem Relation Age of Onset  . Cancer Father   . Heart failure Other   . Hypertension Mother   . Diabetes Mother     Review of Systems:  As per HPI and chief complaint. Patent denies fatigue, has diminished appetite, denies fever, chills, headache, blurred vision, difficulty in speaking, dysphagia, chest  pain, cough, shortness of breath, orthopnea, paroxysmal nocturnal dyspnea, nausea, diaphoresis, abdominal pain, vomiting, diarrhea, belching, heartburn, hematemesis, melena, dysuria, nocturia, urinary frequency, hematochezia, lower extremity pain, or redness. The rest of the systems review is negative.   Physical Exam: Blood pressure 91/62, pulse 120, temperature 98.7 F (37.1 C), temperature source Oral, resp. rate 17, height 5\' 2"  (1.575 m), weight 68.947 kg (152 lb). General:  Patient does not appear to be in obvious acute distress. Alert, communicative, fully oriented, talking in complete sentences, not short of breath at rest.  HEENT:  No clinical pallor, no jaundice, no conjunctival injection or discharge. Hydration status is satisfactory. Fauces are clear, but patient does indeed appear to have oral thrush, on dorsum of tongue. NECK:  Supple, JVP not seen, no carotid bruits, no palpable lymphadenopathy, no palpable goiter. CHEST:  Clinically clear to auscultation, no wheezes, no crackles. HEART:  Sounds 1 and 2 heard, normal, regular, no murmurs. ABDOMEN:  Full, soft, non-tender, no palpable organomegaly, no palpable masses, normal bowel sounds. GENITALIA:  Not examined. LOWER EXTREMITIES:  No pitting edema, palpable peripheral pulses. MUSCULOSKELETAL SYSTEM:  Unremarkable. CENTRAL NERVOUS SYSTEM:  No focal neurologic deficit on gross examination.  Labs on Admission:  No results found for this or any previous visit (from the past 48 hour(s)).  Radiological Exams on Admission: No results found.  Assessment/Plan Principal Problem:  *Delusional disorder: Patient is being managed for this,  by primary service. Active Problems: 1. Oral thrush: Agree with Diflucan therapy, which has already been commenced by primary service. A 7-day course should suffice. 2. Weight Loss: Per patient's history, she has unintentionally, lost an astounding amount of weight in the last 6 months. However,  clinically, she has no objective evidence of malnutrition, and is not anemic. It would be helpful to know what her albumin level is. She has no change in bowel habit. I agree with checking TSH, as primary service has done, particularly in view of tachycardia, but have also ordered an HIV test, given concomitant oral thrush, and findings of substance abuse, on UDS. 3. Tachycardia: Patient has a sinus tachycardia, as high as 120/min today, and this appears to have been persistent, despite Propranolol therapy. 12-Lead EKG of 03/17/12, showed SR, at a rate of 114/min. In addition to TSH, would also do 2D Echocardiogram, and increase Propranolol dose to 20 mg b.i.d initially, and even up to 20 mg t.i.d by 03/24/12, if still tachycardic. A possible culprit is Amphetamine use, and if this is so, resolution can be anticipated,over the next few days. I can find no objective evidence of lower extremity pitting edema, or even lymphedema, on physical examination. 4. UTI: Patient has a positive urinary sediment, with Bacteriuria. It appears that primary service has already treated this with Ciprofloxacin. Urine culture is negative, so far.  Thanks for the consultation, we shall follow with you.  Time Spent on Admission: 45 mins.  Kayla [name],CHRISTOPHER 03/23/2012, 2:42 PM

## 2012-03-23 NOTE — Progress Notes (Signed)
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   The patient was seen today and reports the following:   ADL's: Intact.  Sleep: The patient reports to sleeping well last night.  Appetite: The patient reports that her appetite is improving.   Mild>(1-10) >Severe  Hopelessness (1-10): 5  Depression (1-10): 0  Anxiety (1-10): 0   Suicidal Ideation: The patient adamantly denies any suicidal ideations today.  Plan: No  Intent: No  Means: No   Homicidal Ideation: The patient adamantly denies any homicidal ideations today.  Plan: No  Intent: No.  Means: No   General Appearance/Behavior: The patient was cooperative today with this provider but remained preoccupied with her somatic concerns and when she was going home.  Eye Contact: Good.  Speech: Appropriate in rate and volume with no pressuring noted today.  Motor Behavior: wnl.  Level of Consciousness: Alert and Oriented x 3.  Mental Status: Alert and Oriented x 3.  Mood: Appears moderately depressed.  Affect: Moderately Constricted.  Anxiety Level: No anxiety reported today.  Thought Process: Delusional in her thinking.  Thought Content: The patient denies any auditory or visual hallucinations today. She is displaying ongoing significant somatic delusions today.  Perception: Delusional in her thinking.  Judgment: Poor.  Insight: Poor.  Cognition: Oriented to person, place and time.  Sleep:  Number of Hours: 6.75    Vital Signs:Blood pressure 91/62, pulse 120, temperature 98.7 F (37.1 C), temperature source Oral, resp. rate 17, height 5\' 2"  (1.575 m), weight 68.947 kg (152 lb).  Current Medications: Current Facility-Administered Medications  Medication Dose Route Frequency Provider Last Rate Last Dose  . acetaminophen (TYLENOL) tablet 650 mg  650 mg Oral Q6H PRN Curlene Labrum Jhade Berko, MD      . alum & mag hydroxide-simeth (MAALOX/MYLANTA) 200-200-20 MG/5ML suspension 30 mL   30 mL Oral Q4H PRN Curlene Labrum Adwoa Axe, MD      . ciprofloxacin (CIPRO) tablet 500 mg  500 mg Oral BID Mickie D. Adams, PA   500 mg at 03/23/12 0842  . citalopram (CELEXA) tablet 20 mg  20 mg Oral Daily Curlene Labrum Chancelor Hardrick, MD   20 mg at 03/23/12 0841  . feeding supplement (ENSURE COMPLETE) liquid 237 mL  237 mL Oral TID WC Curlene Labrum Briggs Edelen, MD      . fluconazole (DIFLUCAN) tablet 200 mg  200 mg Oral Once Curlene Labrum Fawna Cranmer, MD   200 mg at 03/23/12 1326  . haloperidol (HALDOL) tablet 1 mg  1 mg Oral QHS Karinna Beadles D Tkai Large, MD      . magnesium hydroxide (MILK OF MAGNESIA) suspension 30 mL  30 mL Oral Daily PRN Curlene Labrum Tanazia Achee, MD      . mirtazapine (REMERON) tablet 15 mg  15 mg Oral QHS Curlene Labrum Malakhi Markwood, MD   15 mg at 03/22/12 2159  . propranolol (INDERAL) tablet 10 mg  10 mg Oral BH-qamhs Curlene Labrum Hollyn Stucky, MD   10 mg at 03/23/12 0841  . risperiDONE (RISPERDAL M-TABS) disintegrating tablet 4 mg  4 mg Oral QHS Curlene Labrum Shante Archambeault, MD   4 mg at 03/22/12 2159  . traZODone (DESYREL) tablet 100 mg  100 mg Oral QHS PRN Ronny Bacon, MD      . DISCONTD: feeding supplement (ENSURE COMPLETE) liquid 237 mL  237 mL Oral QAC supper Anastasio Champion, RD   237 mL at 03/22/12 1707  . DISCONTD: fluconazole (DIFLUCAN) IVPB 200 mg  200 mg Intravenous Once Verne Spurr, PA-C       Lab Results: No results found for this or any previous visit (from the past 48 hour(s)).  Review of Systems:  Neurological: The patient denies any headaches today. She denies any seizures or dizziness.  G.I.: The patient denies any constipation but reports ongoing G.I. Upset today.  Musculoskeletal: The patient denies any muscle or skeletal difficulties.  HEENT:  She does report an ongoing sore throat today.  Time was spent today discussing with the patient her current symptoms. The patient states that she slept well last night and reports that her appetite is improving.  She is exhibiting moderate feelings of sadness, anhedonia and depressed  mood and denies any suicidal or homicidal ideations.  She also denies any anxiety symptoms today. The patient continues to deny any auditory or visual hallucinations but continues to display significant somatic delusions.   Treatment Plan Summary:  1. Daily contact with patient to assess and evaluate symptoms and progress in treatment.  2. Medication management  3. The patient will deny suicidal ideations or homicidal ideations for 48 hours prior to discharge and have a depression and anxiety rating of 3 or less. The patient will also deny any auditory or visual hallucinations or delusional thinking.  4. The patient will deny any symptoms of substance withdrawal at time of discharge.   Plan:  1. Will continue the medication Risperdal at 4 mgs po qhs for her somatic delusions. 2. Will start the medication Haldol at 1 mg po qhs to further address her somatic delusions.  3. Will continue the medication Celexa at 20 mgs po q am for depression.  4. Will continue the medication Cipro at 500 mgs po qam and hs for a UTI,  5. Will continue the medication Propranolol at 10 mgs po q am and hs for tachycardia.  6. Will continue the medication Remeron at 15 mgs po qhs for sleep, appetite and depression.  7. Laboratory Studies reviewed.  8. Will order a TSH, Free T3 and Free T4 to evaluate the patient's thyroid functioning. 9. Hospitalist Consult requested to evaluate the patient's somatic complaints. 10. Will continue to monitor.   Davaughn Hillyard 03/23/2012, 3:00 PM

## 2012-03-23 NOTE — Tx Team (Signed)
Interdisciplinary Treatment Plan Update (Adult)  Date:  03/23/2012  Time Reviewed:  10:15AM-11:15AM  Progress in Treatment: Attending groups:  Yes Participating in groups:    Yes Taking medication as prescribed:    Yes Tolerating medication:   Yes Family/Significant other contact made:  Yes Patient understands diagnosis:   No, poor insight Discussing patient identified problems/goals with staff:   Yes, only focused on medical issues Medical problems stabilized or resolved:   Still having questions about her health Denies suicidal/homicidal ideation:  Yes Issues/concerns per patient self-inventory:   Worried about having cancer, losing weight Other:    New problem(s) identified: No, Describe:    Reason for Continuation of Hospitalization: Delusions  Medication stabilization Other; describe not eating sufficiently  Interventions implemented related to continuation of hospitalization:  Medication monitoring and adjustment, safety checks Q15 min., suicide risk assessment, group therapy, psychoeducation, collateral contact, aftercare planning, ongoing physician assessments, medication education  Additional comments:  Not applicable  Estimated length of stay:  2-3 days  Discharge Plan:  Return to live with family, follow up with [facility]  New goal(s):  Not applicable  Review of initial/current patient goals per problem list:   1.  Goal(s):  Deny SI for 48 hours prior to D/C.  Met:  Yes  Target date:  By Discharge   As evidenced by:  Denies  2.  Goal(s):  Reduce depression, anxiety, and hopelessness to no greater than 3 at discharge.  Met:  Yes  Target date:  By Discharge   As evidenced by:  Denies all today  3.  Goal(s):  Medication stabilization  Met:  No  Target date:  By Discharge   As evidenced by:  Ongoing  4.  Goal(s):  Reduce psychotic symptoms to baseline per family and patient report.  Met:  No  Target date:  By Discharge   As evidenced by:  Still  somatically focused and delusional  Attendees: Patient:  Kayla [name]  03/23/2012 10:15AM-11:15AM  Family:     Physician:  Dr. Harvie Heck Readling 03/23/2012 10:15AM-11:15AM  Nursing:   Izola Price, RN 03/23/2012 10:15AM -11:15AM   Case Manager:  Ambrose Mantle, LCSW 03/23/2012 10:15AM-11:15AM  Counselor:  Veto Kemps, MT-BC 03/23/2012 10:15AM-11:15AM  Other:   Edwyna Shell, RN 03/23/2012 10:15AM-11:15AM  Other:      Other:      Other:       Scribe for Treatment Team:   Sarina Ser, 03/23/2012, 10:15AM-11:15AM

## 2012-03-23 NOTE — Progress Notes (Signed)
Lying quietly in bed with eyes closed.  Safety checks conducted Q 15 minutes. 

## 2012-03-23 NOTE — Discharge Planning (Signed)
Met with patient in Aftercare Planning Group.   She continued to state she is worried about losing so much weight, and Case Manager was gently confrontational about weight loss being natural when one does not eat.  She said she did eat about 40% of her breakfast today.  Case Manager started referral to [facility] in [place] for an appointment.  Ambrose Mantle, LCSW 03/23/2012, 12:45 PM

## 2012-03-23 NOTE — Progress Notes (Signed)
Pt. Is scheduled to go for an echocardiogram tomorrow at 11:00AM with security to pick her up: Pt. made aware and seems somewhat reassured. 19:05--Pt. was accompanied to supper in the cafeteria with much encouragement and was assisted to obtain items for her dinner plate such as butter and a drink; Pt. specifically requested "Hi-C" and was helped getting it.  Pt. refused to eat once she sat down, taking only about 3 tiny bites of food with much encouragement.  Throughout the meal, Pt. repeatedly requested to return to her room, but was able to stay when reassured that this writer would stay with her, nothing bad would happen to her and was seated with this writer close to the door and as separate from peers as possible.  Pt. kept her mostly-side parted hair hanging in her face and her head hanging down, except when she turned to speak to this Clinical research associate.  Pt. stayed in the room for about 15 minutes and left before all other peers returned to the hall accompanied by RN.

## 2012-03-23 NOTE — Progress Notes (Signed)
Adult Services Patient-Family Contact/Session  Attendees: Patient's mother, Lavinia Sharps ([phone])  Goal(s): Collateral  Safety Concerns: none  Narrative: Mother stated that patient was living with her but that actually her son takes care of her. She is legally blind. She reported that in December patient weighed 230 pounds. She started having trouble with her leg swelling up and went to her family doctor. She thought it was a blood clot but after testing it was determined that there was no clot. Patient has been to multiple doctors with numerous symptoms and all tests have come back negative.  Mother reported that at the age of 49 or 4 patient started wanting to not go to school because she didn't like the way she looked. She took her to Dr. Thad Ranger at mental health where he said she had a chemical imbalance. She was never put on medications but did have a lot of therapy and was eventually able to go back to school. 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 Mother confirmed that patient did have an incident with her dog in December but described it more as a nip instead of a bite. She stated it never broke the skin or anything. Patient thought she might have gotten something from the dog. Patient began not eating and mother explained this as her nerves making her sick so she couldn't eat. She eventually took her to the hospital about 1 month ago and she was given IV's for dehydration.  During this incident, patient asked her to call the ambulance and stated that she was dying. Also wanted a healing preacher.  Mother reported that she has always kept to herself and hasn't really interacted a lot with family. Mother could not identify any triggers to patient's behaviors. Thinks she spent too much time on the computer and might have gotten involved in witchcraft.  Barrier(s): None  Interventions: Collateral, support  Recommendation(s): Inpatient stabilization, medications,  Follow-up Required: No  Explanation:  Veto Kemps  03/23/2012, 4:38 PM         Perris Tripathi, Aram Beecham 03/23/2012, 4:56 PM

## 2012-03-24 DIAGNOSIS — R1310 Dysphagia, unspecified: Secondary | ICD-10-CM

## 2012-03-24 DIAGNOSIS — F22 Delusional disorders: Secondary | ICD-10-CM

## 2012-03-24 DIAGNOSIS — R000 Tachycardia, unspecified: Secondary | ICD-10-CM

## 2012-03-24 DIAGNOSIS — I471 Supraventricular tachycardia: Secondary | ICD-10-CM

## 2012-03-24 LAB — COMPREHENSIVE METABOLIC PANEL
ALT: 303 U/L — ABNORMAL HIGH (ref 0–35)
AST: 105 U/L — ABNORMAL HIGH (ref 0–37)
Albumin: 3.1 g/dL — ABNORMAL LOW (ref 3.5–5.2)
Alkaline Phosphatase: 135 U/L — ABNORMAL HIGH (ref 39–117)
Calcium: 9.2 mg/dL (ref 8.4–10.5)
Glucose, Bld: 89 mg/dL (ref 70–99)
Potassium: 4.1 mEq/L (ref 3.5–5.1)
Sodium: 143 mEq/L (ref 135–145)
Total Protein: 6.2 g/dL (ref 6.0–8.3)

## 2012-03-24 LAB — HIV ANTIBODY (ROUTINE TESTING W REFLEX): HIV: NONREACTIVE

## 2012-03-24 LAB — TSH: TSH: 2.051 u[IU]/mL (ref 0.350–4.500)

## 2012-03-24 LAB — T3: T3, Total: 83.8 ng/dl (ref 80.0–204.0)

## 2012-03-24 MED ORDER — DILTIAZEM HCL ER COATED BEADS 180 MG PO CP24
180.0000 mg | ORAL_CAPSULE | Freq: Every day | ORAL | Status: DC
  Administered 2012-03-24 – 2012-03-26 (×3): 180 mg via ORAL
  Filled 2012-03-24 (×2): qty 1
  Filled 2012-03-24: qty 14
  Filled 2012-03-24 (×4): qty 1
  Filled 2012-03-24: qty 14

## 2012-03-24 NOTE — Consult Note (Signed)
PROGRESS NOTE  Kayla [name] ZOX:[id number] DOB: 12-Jul-1971 DOA: 03/18/2012 PCP: Fredirick Maudlin, MD  Brief narrative: 41 yr old female admit with Delusions/weight loss/amphetamine abuse/thrush to Psychiatry service   Past medical history: Tachycardia followed by Dr. Domingo Cocking 11/2011=NSR with some bigeminy with no correlation c activity  Hypokalemia and multiple somatic c/o in past per Dr. Juanetta Gosling 12/2011 note-admitted then with nono-specific weakness Elevated LFT's from 12/27/2011-unclear if ever tested for Hepatitis Elevated blood sugar without diag of DM   Procedures:  Echocardiogram 6/12=EF 50-55%, no wall motion anomalies  Antibiotics:  nil   Subjective  Feels weakn still.  Didn't go to [facility].  States her mouth discomfort had been going on since December No n/v/sob/cp/blurred or double vision/cp/sob   Objective    Interim History: Reviewed all notes  Subjective:   Objective: Filed Vitals:   03/23/12 1147 03/24/12 0815 03/24/12 0822 03/24/12 1530  BP:  119/85 102/72 105/75  Pulse:  112 114 85  Temp:  98 F (36.7 C)    TempSrc:      Resp:  16    Height:      Weight: 68.947 kg (152 lb)      No intake or output data in the 24 hours ending 03/24/12 1714  Exam:  General: Alert pleasant Caucasian female. Poor eye contact, keeps hand at face full interview Cardiovascular: S1-S2 no murmur rub or gallop tachycardic Respiratory: Clinically clear no added sound Abdomen: Soft nontender nondistended Skin no rash Neuro intact  Data Reviewed: Basic Metabolic Panel:  Lab 03/24/12 8119 03/23/12 1938  NA 143 141  K 4.1 3.9  CL 103 102  CO2 33* 31  GLUCOSE 89 99  BUN 10 10  CREATININE 0.66 0.69  CALCIUM 9.2 8.8  MG 2.5 --  PHOS -- --   Liver Function Tests:  Lab 03/24/12 0605 03/23/12 1938  AST 105* 162*  ALT 303* 367*  ALKPHOS 135* 144*  BILITOT 0.2* 0.1*  PROT 6.2 6.3  ALBUMIN 3.1* 3.1*   No results found for this basename: LIPASE:5,AMYLASE:5  in the last 168 hours No results found for this basename: AMMONIA:5 in the last 168 hours CBC: No results found for this basename: WBC:5,NEUTROABS:5,HGB:5,HCT:5,MCV:5,PLT:5 in the last 168 hours Cardiac Enzymes: No results found for this basename: CKTOTAL:5,CKMB:5,CKMBINDEX:5,TROPONINI:5 in the last 168 hours BNP: No components found with this basename: POCBNP:5 CBG: No results found for this basename: GLUCAP:5 in the last 168 hours  Recent Results (from the past 240 hour(s))  URINE CULTURE     Status: Normal   Collection Time   03/17/12 10:32 AM      Component Value Range Status Comment   Specimen Description URINE, CLEAN CATCH   Final    Special Requests NONE   Final    Culture  Setup Time [id number]   Final    Colony Count NO GROWTH   Final    Culture NO GROWTH   Final    Report Status 03/18/2012 FINAL   Final      Studies:              All Imaging reviewed and is as per above notation   Scheduled Meds:   . citalopram  20 mg Oral Daily  . feeding supplement  237 mL Oral TID BM  . fluconazole  100 mg Oral Daily  . haloperidol  1 mg Oral QHS  . mirtazapine  15 mg Oral QHS  . propranolol  20 mg Oral BH-qamhs  . risperiDONE  4  mg Oral QHS   Continuous Infusions:    Assessment/Plan: *Delusional disorder: Patient is being managed for this, by primary service.  Active Problems:  1. Oral thrush: Agree with Diflucan therapy, which has already been commenced by primary service. A 7-day course should suffice.  2. Weight Loss: Per patient's history, she has unintentionally, lost an astounding amount of weight in the last 6 months. However, clinically, she has no objective evidence of malnutrition, and is not anemic.--Her albumin is 3.1 and she hasn't elevated ALT to AST indicating possible hepatitis. I've ordered hepatitis titers for her. She states that she's not eaten well since December and I attribute this likely to her oral thrush-before ordering any other tests patient  requests no further Ct scans-as she states she was bitten by a puppy and had multiple CT scans.  I would possibly work her up as an outpatient for weight loss and my differential diagnosis is broad and would include delusional disorder with anxiety component versus hepatitis versus rheumatological process, most likely was just infectious and was secondary to oral thrush 3. Tachycardia: Patient has a sinus tachycardia, as high as 120/min today, and this appears to have been persistent, despite Propranolol therapy-given that she feels a little bit run down today I have cut off her propanolol completely and change her to diltiazem 180 mg which I can uptitrate tomorrow. 12-Lead EKG of 03/17/12, showed SR, at a rate of 114/min. she's been worked up for this in the past by Dr. Dietrich Pates and this is not a new complaint. She had a Holter monitor done for 24 hours 2/13 which was negative and I feel this would be relatively low yield to repeat at present-her echocardiogram done showed an EF 50-55% and no dysfunction therefore I would probably have this reevaluated as an outpatient appointment 4. UTI: Patient has a positive urinary sediment, with Bacteriuria. It appears that primary service has already treated this with Ciprofloxacin. Urine culture is negative, so far   Code Status: Full Family Communication: None at bedside Disposition Plan: Per psychiatrist-I will review the patient tomorrow with further lab work and we'll make other necessary adjustments to medications for tachycardia however likely most of these other concerns can be followed as an outpatient   Pleas Koch, MD  [facility] Pager [phone] 03/24/2012, 5:14 PM    LOS: 6 days

## 2012-03-24 NOTE — Progress Notes (Signed)
[facility] Group Notes:  (Counselor/Nursing/MHT/Case Management/Adjunct)  03/24/2012 7:54 AM  Type of Therapy:  Group Therapy  Participation Level:  Did Not Attend    Kayla [name] 03/24/2012, 7:54 AM

## 2012-03-24 NOTE — Progress Notes (Signed)
Pt notified RN of feeling weak and not having much energy; checked pt's vital signs and they were WDL; notified MD, no new orders given, will continue to monitor pt

## 2012-03-24 NOTE — Progress Notes (Signed)
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   The patient was seen today and reports the following:   ADL's: Intact.  Sleep: The patient reports to sleeping well last night.  Appetite: The patient reports that her appetite is improving with no nausea or vomiting today.   Mild>(1-10) >Severe  Hopelessness (1-10): 0  Depression (1-10): 0  Anxiety (1-10): 0   Suicidal Ideation: The patient adamantly denies any suicidal ideations today.  Plan: No  Intent: No  Means: No   Homicidal Ideation: The patient adamantly denies any homicidal ideations today.  Plan: No  Intent: No.  Means: No   General Appearance/Behavior: The patient was cooperative today with this provider but remained preoccupied with her somatic concerns and when she was going home.  Eye Contact: Good.  Speech: Appropriate in rate and volume with no pressuring noted today.  Motor Behavior: wnl.  Level of Consciousness: Alert and Oriented x 3.  Mental Status: Alert and Oriented x 3.  Mood: Appears mild to moderately depressed.  Affect: Mild to moderately Constricted.  Anxiety Level: No anxiety reported today.  Thought Process: Delusional in her thinking.  Thought Content: The patient denies any auditory or visual hallucinations today. She is displaying ongoing somatic delusions today.  Perception: Delusional in her thinking.  Judgment: Fair.  Insight: Poor.  Cognition: Oriented to person, place and time.  Sleep:  Number of Hours: 6.25    Vital Signs:Blood pressure 105/75, pulse 85, temperature 98 F (36.7 C), temperature source Oral, resp. rate 16, height 5\' 2"  (1.575 m), weight 68.947 kg (152 lb).  Current Medications: Current Facility-Administered Medications  Medication Dose Route Frequency Provider Last Rate Last Dose  . acetaminophen (TYLENOL) tablet 650 mg  650 mg Oral Q6H PRN Curlene Labrum Ranee Peasley, MD      . alum & mag hydroxide-simeth (MAALOX/MYLANTA)  200-200-20 MG/5ML suspension 30 mL  30 mL Oral Q4H PRN Curlene Labrum Lainey Nelson, MD      . citalopram (CELEXA) tablet 20 mg  20 mg Oral Daily Curlene Labrum Jorje Vanatta, MD   20 mg at 03/24/12 0819  . feeding supplement (ENSURE COMPLETE) liquid 237 mL  237 mL Oral TID BM Curlene Labrum Taeya Theall, MD   237 mL at 03/24/12 1645  . fluconazole (DIFLUCAN) tablet 100 mg  100 mg Oral Daily Laveda Norman, MD   100 mg at 03/24/12 0818  . haloperidol (HALDOL) tablet 1 mg  1 mg Oral QHS Curlene Labrum Ronae Noell, MD   1 mg at 03/23/12 2213  . magnesium hydroxide (MILK OF MAGNESIA) suspension 30 mL  30 mL Oral Daily PRN Curlene Labrum Georjean Toya, MD      . mirtazapine (REMERON) tablet 15 mg  15 mg Oral QHS Curlene Labrum Eevee Borbon, MD   15 mg at 03/23/12 2213  . propranolol (INDERAL) tablet 20 mg  20 mg Oral BH-qamhs Laveda Norman, MD   20 mg at 03/24/12 0818  . risperiDONE (RISPERDAL M-TABS) disintegrating tablet 4 mg  4 mg Oral QHS Curlene Labrum Jahel Wavra, MD   4 mg at 03/23/12 2215  . traZODone (DESYREL) tablet 100 mg  100 mg Oral QHS PRN Ronny Bacon, MD       Lab Results:  Results for orders placed during the hospital encounter of 03/18/12 (from the past 48 hour(s))  COMPREHENSIVE METABOLIC PANEL     Status: Abnormal   Collection Time   03/23/12  7:38 PM  Component Value Range Comment   Sodium 141  135 - 145 mEq/L    Potassium 3.9  3.5 - 5.1 mEq/L    Chloride 102  96 - 112 mEq/L    CO2 31  19 - 32 mEq/L    Glucose, Bld 99  70 - 99 mg/dL    BUN 10  6 - 23 mg/dL    Creatinine, Ser 0.45  0.50 - 1.10 mg/dL    Calcium 8.8  8.4 - 40.9 mg/dL    Total Protein 6.3  6.0 - 8.3 g/dL    Albumin 3.1 (*) 3.5 - 5.2 g/dL    AST 811 (*) 0 - 37 U/L    ALT 367 (*) 0 - 35 U/L    Alkaline Phosphatase 144 (*) 39 - 117 U/L    Total Bilirubin 0.1 (*) 0.3 - 1.2 mg/dL    GFR calc non Af Amer >90  >90 mL/min    GFR calc Af Amer >90  >90 mL/min   TSH     Status: Normal   Collection Time   03/23/12  7:38 PM      Component Value Range Comment   TSH 2.051  0.350 - 4.500  uIU/mL   T3     Status: Normal   Collection Time   03/23/12  7:38 PM      Component Value Range Comment   T3, Total 83.8  80.0 - 204.0 ng/dl   T4, FREE     Status: Normal   Collection Time   03/23/12  7:38 PM      Component Value Range Comment   Free T4 1.26  0.80 - 1.80 ng/dL   HIV ANTIBODY (ROUTINE TESTING)     Status: Normal   Collection Time   03/23/12  7:38 PM      Component Value Range Comment   HIV NON REACTIVE  NON REACTIVE   COMPREHENSIVE METABOLIC PANEL     Status: Abnormal   Collection Time   03/24/12  6:05 AM      Component Value Range Comment   Sodium 143  135 - 145 mEq/L    Potassium 4.1  3.5 - 5.1 mEq/L    Chloride 103  96 - 112 mEq/L    CO2 33 (*) 19 - 32 mEq/L    Glucose, Bld 89  70 - 99 mg/dL    BUN 10  6 - 23 mg/dL    Creatinine, Ser 9.14  0.50 - 1.10 mg/dL    Calcium 9.2  8.4 - 78.2 mg/dL    Total Protein 6.2  6.0 - 8.3 g/dL    Albumin 3.1 (*) 3.5 - 5.2 g/dL    AST 956 (*) 0 - 37 U/L    ALT 303 (*) 0 - 35 U/L    Alkaline Phosphatase 135 (*) 39 - 117 U/L    Total Bilirubin 0.2 (*) 0.3 - 1.2 mg/dL    GFR calc non Af Amer >90  >90 mL/min    GFR calc Af Amer >90  >90 mL/min   MAGNESIUM     Status: Normal   Collection Time   03/24/12  6:05 AM      Component Value Range Comment   Magnesium 2.5  1.5 - 2.5 mg/dL    Review of Systems:  Neurological: The patient denies any headaches today. She denies any seizures or dizziness.  G.I.: The patient denies any constipation or G.I. Upset today.  Musculoskeletal: The patient denies any muscle or skeletal difficulties.  HEENT: She does report an ongoing sore throat which is improving today.   Time was spent today discussing with the patient her current symptoms. The patient states that she is sleeping well at night and reports that her appetite is improving with no nausea or vomiting reported. She is exhibiting mild to moderate feelings of sadness, anhedonia and depressed mood and denies any suicidal or homicidal  ideations. She also denies any anxiety symptoms today. The patient continues to deny any auditory or visual hallucinations but continues to display significant somatic delusions.   Treatment Plan Summary:  1. Daily contact with patient to assess and evaluate symptoms and progress in treatment.  2. Medication management  3. The patient will deny suicidal ideations or homicidal ideations for 48 hours prior to discharge and have a depression and anxiety rating of 3 or less. The patient will also deny any auditory or visual hallucinations or delusional thinking.  4. The patient will deny any symptoms of substance withdrawal at time of discharge.   Plan:  1. Will continue the medication Risperdal at 4 mgs po qhs for her somatic delusions.  2. Will continue the medication Haldol at 1 mg po qhs to further address her somatic delusions.  3. Will continue the medication Celexa at 20 mgs po q am for depression.  4. Will continue the medication Cipro at 500 mgs po qam and hs for a UTI,  5. Will continue the medication Propranolol at 20 mgs po q am and hs for tachycardia.  6. Will continue the medication Remeron at 15 mgs po qhs for sleep, appetite and depression.  7. Laboratory Studies reviewed.  8. Hospitalist Consult completed and they will follow the patient's non-psychiatric medical complaints.  9. Will continue to monitor.   Gabbrielle Mcnicholas 03/24/2012, 5:11 PM

## 2012-03-24 NOTE — Progress Notes (Signed)
Echocardiogram 2D Echocardiogram has been performed.  Kayla [name] [facility] 03/24/2012, 12:07 PM

## 2012-03-24 NOTE — Progress Notes (Signed)
D: Pt seems to have sad mood and affect and rates her depression and hopelessness a 1 out of 10 (1 low/10 high); pt is cautious in interactions with staff;  pt is also currently asking about her discharge date A: Pt given emotional support from staff and pt's medication routine is continued R: Pt remains appropriate and cooperative with staff and continues to have sad mood and affect

## 2012-03-24 NOTE — Progress Notes (Signed)
Adult Services Patient-Family Contact/Session  Attendees:  Patient's mother, Lavinia Sharps  Goal(s):  Discharge planning and update  Safety Concerns: none    Narrative:  Mother called to get report about patient. Reported to her that she was having some tests done, but that she was discovered to have thrush which might answer some of patient's complaints. Reported to her that she was less anxious and doing better. Discussed anticipated discharge on Friday if she continued to make improvement. Mother stated that one of her sons would come pick up patient when she is discharged. Also discussed follow up with patient.  Barrier(s):  Patient's continued somatic complaints  Interventions:  Information, education, discharge planning  Recommendation(s):  Outpatient follow up  Follow-up Required:  Yes  Explanation:  Call mother to arrange transportation when discharged.  HartisAram Beecham 03/24/2012, 3:59 PM

## 2012-03-24 NOTE — Discharge Planning (Signed)
Patient not seen today, as she did not attend Aftercare Planning Group.  Referral was sent to [facility] to obtain a [facility] hospital discharge appointment.  Ambrose Mantle, LCSW 03/24/2012, 12:15 PM

## 2012-03-25 ENCOUNTER — Ambulatory Visit (INDEPENDENT_AMBULATORY_CARE_PROVIDER_SITE_OTHER): Payer: Medicaid Other | Admitting: Otolaryngology

## 2012-03-25 DIAGNOSIS — F22 Delusional disorders: Secondary | ICD-10-CM

## 2012-03-25 DIAGNOSIS — I471 Supraventricular tachycardia: Secondary | ICD-10-CM

## 2012-03-25 DIAGNOSIS — R1310 Dysphagia, unspecified: Secondary | ICD-10-CM

## 2012-03-25 LAB — HEMOGLOBIN A1C: Mean Plasma Glucose: 117 mg/dL — ABNORMAL HIGH (ref <117)

## 2012-03-25 LAB — COMPREHENSIVE METABOLIC PANEL
AST: 46 U/L — ABNORMAL HIGH (ref 0–37)
Albumin: 3.1 g/dL — ABNORMAL LOW (ref 3.5–5.2)
Chloride: 101 mEq/L (ref 96–112)
Creatinine, Ser: 0.61 mg/dL (ref 0.50–1.10)
Potassium: 4.2 mEq/L (ref 3.5–5.1)
Total Bilirubin: 0.2 mg/dL — ABNORMAL LOW (ref 0.3–1.2)
Total Protein: 6.4 g/dL (ref 6.0–8.3)

## 2012-03-25 LAB — PROTIME-INR
INR: 0.91 (ref 0.00–1.49)
Prothrombin Time: 12.5 seconds (ref 11.6–15.2)

## 2012-03-25 MED ORDER — HALOPERIDOL 2 MG PO TABS
2.0000 mg | ORAL_TABLET | Freq: Every day | ORAL | Status: DC
  Administered 2012-03-25: 2 mg via ORAL
  Filled 2012-03-25: qty 2
  Filled 2012-03-25: qty 1
  Filled 2012-03-25: qty 14
  Filled 2012-03-25 (×2): qty 1

## 2012-03-25 NOTE — Progress Notes (Signed)
Pt observed lying in bed awake with eyes closed.  She had taken a shower earlier, but has not combed her long hair.  Earlier RN reported that pt had started her period, but was just lying in bed without getting up to clean herself and put on a pad.  Staff assisted her in getting up to bathe.  Her linens were changed by staff.  She is sad and withdrawn.  She voices no needs/concerns at this time.  She had a med change from visit by internal med.  She denies SI/HI/AV.  She still believes she has cancer, even though she has been told the discomfort in her throat is thrush.  Pt has to be encouraged to do ADL and eat.  Appetite is poor.   Pt has been med compliant.  Safety maintained with q15 minute checks.

## 2012-03-25 NOTE — Discharge Planning (Signed)
Patient was standing outside door just prior to Aftercare Planning Group, but when it was time for group, returned to her room and would not come.  Appointment made for her to go to [facility] in [place] for follow-up on 03/29/12 7:45AM-11:00AM.  Case Manager did utilization review for additional days.  Ambrose Mantle, LCSW 03/25/2012, 1:11 PM

## 2012-03-25 NOTE — Progress Notes (Deleted)
Pt states she is doing fair, appetite is improving, rates her depression and hopelessness as a 1 today. Pt denies SI/HI. According to [facility] team, pt is probably going to be discharged tomorrow. When asked, pt states she has no c/o pain. Pt still sees "cats and dead bodies". Compliant with meds, just "wants to go home".

## 2012-03-25 NOTE — Progress Notes (Signed)
Pt states she slept fair, appetite is improving. Pt rates her depression and hopelessness as a 1, with 10 being the worst feeling. Pt remains isolative, denies SI, HI. Pt asks, "When am I going home?" According to tx team, pt is to be discharged tomorrow. Pt continues to need help with her ADLs; pt started menses yesterday and was found sitting on her bed, with no pad in place.

## 2012-03-25 NOTE — Progress Notes (Signed)
[facility] Group Notes:  (Counselor/Nursing/MHT/Case Management/Adjunct)  03/25/2012 1:13 PM  Type of Therapy:  Group Therapy  Participation Level:  Did Not Attend     Veto Kemps 03/25/2012, 1:13 PM

## 2012-03-25 NOTE — Progress Notes (Signed)
Patient ID: EDLA PARA, female   DOB: 1971/01/19, 41 y.o.   MRN: [id number] Pt. has been in bed for most of the day: denies lethality, but remains reclusive and isolative: Pt. Is reluctant to be around people, including her roommate, causing her to lie on her Rt. Side, facing the window. Pt. refused to go to supper and refused to go to [place].   18:30-Pt. seen by Internal medicine consult re: thrush and her anorexia (except for Ensure).  Pt. Was equivocal when asked if she would agree to an (endoscopy-EGD) to discover the state of her throat and stomach due to the thrush and c/o of abdominal pain when eating solid food. 21:30-Followed orders for EGD prep: Pt.'s VS taken with O2 sat.and will be NPO after midnight. 23:00-Pt. given another Ensure to allow for her nutritional deficit, in light of her prep for endoscopy tomorrow. Pt. Stated she had now decided she doesn't want to have the procedure, but encouraged Pt. to reconsider, since she has been having so much trouble eating and has a very sore throat due to the thrush.  Pt. drank the ensure without further comment then went back to lie down to sleep.

## 2012-03-25 NOTE — Progress Notes (Signed)
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   The patient was seen today and reports the following:   ADL's: Intact.  Sleep: The patient reports to sleeping well last night but feels a little oversedated this morning. Appetite: The patient reports that her appetite is now good with no nausea or vomiting today.   Mild>(1-10) >Severe  Hopelessness (1-10): 0  Depression (1-10): 0  Anxiety (1-10): 0   Suicidal Ideation: The patient adamantly denies any suicidal ideations today.  Plan: No  Intent: No  Means: No   Homicidal Ideation: The patient adamantly denies any homicidal ideations today.  Plan: No  Intent: No.  Means: No   General Appearance/Behavior: The patient was cooperative today with this provider and was less preoccupied with her somatic concerns today.  Eye Contact: Good.  Speech: Appropriate in rate and volume with no pressuring noted today.  Motor Behavior: wnl.  Level of Consciousness: Alert and Oriented x 3.  Mental Status: Alert and Oriented x 3.  Mood: Appears mild to moderately depressed.  Affect: Mild to moderately Constricted.  Anxiety Level: No anxiety reported today.  Thought Process: Delusional in her thinking but improving.  Thought Content: The patient denies any auditory or visual hallucinations today. She is displaying ongoing somatic delusions today but improved.  Perception: Delusional in her thinking but improving.  Judgment: Fair.  Insight: Poor.  Cognition: Oriented to person, place and time.  Sleep:  Number of Hours: 6.75    Vital Signs:Blood pressure 116/84, pulse 92, temperature 98.4 F (36.9 C), temperature source Oral, resp. rate 16, height 5\' 2"  (1.575 m), weight 68.947 kg (152 lb).  Current Medications: Current Facility-Administered Medications  Medication Dose Route Frequency Provider Last Rate Last Dose  . acetaminophen (TYLENOL) tablet 650 mg  650 mg Oral Q6H PRN Curlene Labrum  Cristian Davitt, MD      . alum & mag hydroxide-simeth (MAALOX/MYLANTA) 200-200-20 MG/5ML suspension 30 mL  30 mL Oral Q4H PRN Curlene Labrum Vola Beneke, MD      . citalopram (CELEXA) tablet 20 mg  20 mg Oral Daily Curlene Labrum Kloe Oates, MD   20 mg at 03/25/12 0855  . diltiazem (CARDIZEM CD) 24 hr capsule 180 mg  180 mg Oral Daily Rhetta Mura, MD   180 mg at 03/25/12 0855  . feeding supplement (ENSURE COMPLETE) liquid 237 mL  237 mL Oral TID BM Curlene Labrum Jakeira Seeman, MD   237 mL at 03/25/12 1329  . fluconazole (DIFLUCAN) tablet 100 mg  100 mg Oral Daily Laveda Norman, MD   100 mg at 03/25/12 0855  . haloperidol (HALDOL) tablet 2 mg  2 mg Oral QHS Cannon Quinton D Wavie Hashimi, MD      . magnesium hydroxide (MILK OF MAGNESIA) suspension 30 mL  30 mL Oral Daily PRN Curlene Labrum Pressley Tadesse, MD      . mirtazapine (REMERON) tablet 15 mg  15 mg Oral QHS Curlene Labrum Williette Loewe, MD   15 mg at 03/24/12 2152  . risperiDONE (RISPERDAL M-TABS) disintegrating tablet 4 mg  4 mg Oral QHS Curlene Labrum Nadira Single, MD   4 mg at 03/24/12 2152  . traZODone (DESYREL) tablet 100 mg  100 mg Oral QHS PRN Ronny Bacon, MD      . DISCONTD: haloperidol (HALDOL) tablet 1 mg  1 mg Oral QHS Curlene Labrum Petronella Shuford, MD   1 mg at 03/24/12 2152  . DISCONTD: propranolol (INDERAL) tablet 20 mg  20 mg  Oral BH-qamhs Laveda Norman, MD   20 mg at 03/24/12 0818   Lab Results:  Results for orders placed during the hospital encounter of 03/18/12 (from the past 48 hour(s))  COMPREHENSIVE METABOLIC PANEL     Status: Abnormal   Collection Time   03/23/12  7:38 PM      Component Value Range Comment   Sodium 141  135 - 145 mEq/L    Potassium 3.9  3.5 - 5.1 mEq/L    Chloride 102  96 - 112 mEq/L    CO2 31  19 - 32 mEq/L    Glucose, Bld 99  70 - 99 mg/dL    BUN 10  6 - 23 mg/dL    Creatinine, Ser 9.60  0.50 - 1.10 mg/dL    Calcium 8.8  8.4 - 45.4 mg/dL    Total Protein 6.3  6.0 - 8.3 g/dL    Albumin 3.1 (*) 3.5 - 5.2 g/dL    AST 098 (*) 0 - 37 U/L    ALT 367 (*) 0 - 35 U/L    Alkaline  Phosphatase 144 (*) 39 - 117 U/L    Total Bilirubin 0.1 (*) 0.3 - 1.2 mg/dL    GFR calc non Af Amer >90  >90 mL/min    GFR calc Af Amer >90  >90 mL/min   TSH     Status: Normal   Collection Time   03/23/12  7:38 PM      Component Value Range Comment   TSH 2.051  0.350 - 4.500 uIU/mL   T3     Status: Normal   Collection Time   03/23/12  7:38 PM      Component Value Range Comment   T3, Total 83.8  80.0 - 204.0 ng/dl   T4, FREE     Status: Normal   Collection Time   03/23/12  7:38 PM      Component Value Range Comment   Free T4 1.26  0.80 - 1.80 ng/dL   HIV ANTIBODY (ROUTINE TESTING)     Status: Normal   Collection Time   03/23/12  7:38 PM      Component Value Range Comment   HIV NON REACTIVE  NON REACTIVE   COMPREHENSIVE METABOLIC PANEL     Status: Abnormal   Collection Time   03/24/12  6:05 AM      Component Value Range Comment   Sodium 143  135 - 145 mEq/L    Potassium 4.1  3.5 - 5.1 mEq/L    Chloride 103  96 - 112 mEq/L    CO2 33 (*) 19 - 32 mEq/L    Glucose, Bld 89  70 - 99 mg/dL    BUN 10  6 - 23 mg/dL    Creatinine, Ser 1.19  0.50 - 1.10 mg/dL    Calcium 9.2  8.4 - 14.7 mg/dL    Total Protein 6.2  6.0 - 8.3 g/dL    Albumin 3.1 (*) 3.5 - 5.2 g/dL    AST 829 (*) 0 - 37 U/L    ALT 303 (*) 0 - 35 U/L    Alkaline Phosphatase 135 (*) 39 - 117 U/L    Total Bilirubin 0.2 (*) 0.3 - 1.2 mg/dL    GFR calc non Af Amer >90  >90 mL/min    GFR calc Af Amer >90  >90 mL/min   MAGNESIUM     Status: Normal   Collection Time   03/24/12  6:05 AM  Component Value Range Comment   Magnesium 2.5  1.5 - 2.5 mg/dL   HEPATITIS PANEL, ACUTE     Status: Normal (Preliminary result)   Collection Time   03/24/12  7:50 PM      Component Value Range Comment   Hepatitis B Surface Ag NEGATIVE  NEGATIVE    HCV Ab NEGATIVE  NEGATIVE    Hep A IgM PENDING  NEGATIVE    Hep B C IgM PENDING  NEGATIVE   HEMOGLOBIN A1C     Status: Abnormal   Collection Time   03/25/12  6:40 AM      Component Value  Range Comment   Hemoglobin A1C 5.7 (*) <5.7 %    Mean Plasma Glucose 117 (*) <117 mg/dL   PROTIME-INR     Status: Normal   Collection Time   03/25/12  6:40 AM      Component Value Range Comment   Prothrombin Time 12.5  11.6 - 15.2 seconds    INR 0.91  0.00 - 1.49   COMPREHENSIVE METABOLIC PANEL     Status: Abnormal   Collection Time   03/25/12  6:40 AM      Component Value Range Comment   Sodium 139  135 - 145 mEq/L    Potassium 4.2  3.5 - 5.1 mEq/L    Chloride 101  96 - 112 mEq/L    CO2 29  19 - 32 mEq/L    Glucose, Bld 109 (*) 70 - 99 mg/dL    BUN 9  6 - 23 mg/dL    Creatinine, Ser 9.60  0.50 - 1.10 mg/dL    Calcium 9.0  8.4 - 45.4 mg/dL    Total Protein 6.4  6.0 - 8.3 g/dL    Albumin 3.1 (*) 3.5 - 5.2 g/dL    AST 46 (*) 0 - 37 U/L    ALT 215 (*) 0 - 35 U/L    Alkaline Phosphatase 124 (*) 39 - 117 U/L    Total Bilirubin 0.2 (*) 0.3 - 1.2 mg/dL    GFR calc non Af Amer >90  >90 mL/min    GFR calc Af Amer >90  >90 mL/min    Review of Systems:  Neurological: The patient denies any headaches today. She denies any seizures or dizziness.  G.I.: The patient denies any constipation or G.I. Upset today.  Musculoskeletal: The patient denies any muscle or skeletal difficulties.  HEENT: She does report an ongoing sore throat which is continuing to improve.   Time was spent today discussing with the patient her current symptoms. The patient states that she is continuing to sleep well at night but feel somewhat oversedated this morning.  She reports a good appetite with no nausea or vomiting reported. She is exhibiting mild to moderate feelings of sadness, anhedonia and depressed mood but adamantly denies any suicidal or homicidal ideations. She also denies any anxiety symptoms today. The patient continues to deny any auditory or visual hallucinations and appears to have less somatic delusions today.   Treatment Plan Summary:  1. Daily contact with patient to assess and evaluate symptoms and  progress in treatment.  2. Medication management  3. The patient will deny suicidal ideations or homicidal ideations for 48 hours prior to discharge and have a depression and anxiety rating of 3 or less. The patient will also deny any auditory or visual hallucinations or delusional thinking.  4. The patient will deny any symptoms of substance withdrawal at time of discharge.  Plan:  1. Will continue the medication Risperdal at 4 mgs po qhs for her somatic delusions.  2. Will increase the medication Haldol to 2 mg po qhs to further address her somatic delusions.  3. Will continue the medication Celexa at 20 mgs po q am for depression.  4. Will continue the medication Cipro at 500 mgs po qam and hs for a UTI,  5. The medication Propranolol ordered for tachycardia was discontinued by the Hospitalist due to fatigue and started Cardizem CD 180 mgs po qam.  6. Will continue the medication Remeron at 15 mgs po qhs for sleep, appetite and depression.  7. Laboratory Studies reviewed.  8. Hospitalist is continuing to follow the patient's non-psychiatric medical complaints.  9. Will continue to monitor.   Anyelina Claycomb 03/25/2012, 5:56 PM

## 2012-03-25 NOTE — Progress Notes (Signed)
MEDICATION RELATED CONSULT NOTE - INITIAL   Pharmacy Consult for Medications related to increase in LFTs Indication: mildly elevated LFTs  No Known Allergies  Patient Measurements: Height: 5\' 2"  (157.5 cm) Weight: 152 lb (68.947 kg) IBW/kg (Calculated) : 50.1   Vital Signs: Temp: 98.4 F (36.9 C) (06/13 0836) BP: 116/84 mmHg (06/13 0837) Pulse Rate: 92  (06/13 0837) Intake/Output from previous day:   Intake/Output from this shift:    Labs:  Basename 03/25/12 0640 03/24/12 0605 03/23/12 1938  WBC -- -- --  HGB -- -- --  HCT -- -- --  PLT -- -- --  APTT -- -- --  CREATININE 0.61 0.66 0.69  LABCREA -- -- --  CREATININE 0.61 0.66 0.69  CREAT24HRUR -- -- --  MG -- 2.5 --  PHOS -- -- --  ALBUMIN 3.1* 3.1* 3.1*  PROT 6.4 6.2 6.3  ALBUMIN 3.1* 3.1* 3.1*  AST 46* 105* 162*  ALT 215* 303* 367*  ALKPHOS 124* 135* 144*  BILITOT 0.2* 0.2* 0.1*  BILIDIR -- -- --  IBILI -- -- --   Estimated Creatinine Clearance: 85.9 ml/min (by C-G formula based on Cr of 0.61).  Medical History: Past Medical History  Diagnosis Date  . Ovarian cyst     cervical polyp  . Anxiety     Panic attacks; somatization  . Palpitations     possible bradycardia   Medications:  No prescriptions prior to admission   Assessment:  Concern for elevated LFT's; higher this admit compared to 3/13 (Alk phos 80, AST/ALT 40/60)  60 lb wt loss since December, little po intake  Note that urine drug screen positive for Amphetamine this admission, no home medications stated by patient.  Note that patient claimed that she was given Amphetamine by nurse, denied taking at home. Denies taking any supplements.  Previous med rec stated MVI and Furosemide (no prescription filled since 2012)  Risperdal and Potassium added after 3/13 psych admission.  Unable to pinpoint any consistent medication that would lead to elevated LFT's.  Would wonder how patient has access to any amphetamine; if supplements taken  or use of other person's meds in household.  Could tachycardia be a result of stimulant use.  Otho Bellows PharmD Pager [phone] 03/25/2012,7:33 PM

## 2012-03-25 NOTE — Consult Note (Signed)
PROGRESS NOTE  Kayla [name] ZOX:[id number] DOB: [birth year] DOA: 03/18/2012 PCP: Fredirick Maudlin, MD  Brief narrative: 41 yr old female admit with Delusions/weight loss/amphetamine abuse/thrush to Psychiatry service   Past medical history: Tachycardia followed by Dr. Domingo Cocking 11/2011=NSR with some bigeminy with no correlation c activity   Hypokalemia and multiple somatic c/o in past per Dr. Juanetta Gosling 12/2011 note-admitted then with nono-specific weakness  Elevated LFT's from 12/27/2011-unclear if ever tested for Hepatitis  Elevated blood sugar without diag of DM   Procedures:  Echocardiogram 6/12=EF 50-55%, no wall motion anomalies  Antibiotics:  nil   Subjective  Feels weak.  Nursing reports has only been taking in Ensure and not really eating much at all-avoided Supper.  Patient adamantly refused to got to supper tonight.  Didn't go to Supper- States her mouth discomfort had been going on since December No n/v/sob/cp/blurred or double vision/cp/sob   Objective    Interim History: Reviewed all notes  Subjective:   Objective: Filed Vitals:   03/24/12 1530 03/24/12 1800 03/25/12 0836 03/25/12 0837  BP: 105/75 102/72 111/76 116/84  Pulse: 85 80 111 92  Temp:   98.4 F (36.9 C)   TempSrc:      Resp:   16   Height:      Weight:       No intake or output data in the 24 hours ending 03/25/12 1833  Exam:  General: Alert pleasant Caucasian female. Poor eye contact, keeps hand at face full interview. Cardiovascular: S1-S2 no murmur rub or gallop tachycardic Respiratory: Clinically clear no added sound Abdomen: Soft nontender nondistended Skin no rash Neuro intact  Data Reviewed: Basic Metabolic Panel:  Lab 03/25/12 8119 03/24/12 0605 03/23/12 1938  NA 139 143 141  K 4.2 4.1 --  CL 101 103 102  CO2 29 33* 31  GLUCOSE 109* 89 99  BUN 9 10 10   CREATININE 0.61 0.66 0.69  CALCIUM 9.0 9.2 8.8  MG -- 2.5 --  PHOS -- -- --   Liver Function Tests:  Lab  03/25/12 0640 03/24/12 0605 03/23/12 1938  AST 46* 105* 162*  ALT 215* 303* 367*  ALKPHOS 124* 135* 144*  BILITOT 0.2* 0.2* 0.1*  PROT 6.4 6.2 6.3  ALBUMIN 3.1* 3.1* 3.1*   No results found for this basename: LIPASE:5,AMYLASE:5 in the last 168 hours No results found for this basename: AMMONIA:5 in the last 168 hours CBC: No results found for this basename: WBC:5,NEUTROABS:5,HGB:5,HCT:5,MCV:5,PLT:5 in the last 168 hours Cardiac Enzymes: No results found for this basename: CKTOTAL:5,CKMB:5,CKMBINDEX:5,TROPONINI:5 in the last 168 hours BNP: No components found with this basename: POCBNP:5 CBG: No results found for this basename: GLUCAP:5 in the last 168 hours  Recent Results (from the past 240 hour(s))  URINE CULTURE     Status: Normal   Collection Time   03/17/12 10:32 AM      Component Value Range Status Comment   Specimen Description URINE, CLEAN CATCH   Final    Special Requests NONE   Final    Culture  Setup Time [id number]   Final    Colony Count NO GROWTH   Final    Culture NO GROWTH   Final    Report Status 03/18/2012 FINAL   Final      Studies:              All Imaging reviewed and is as per above notation   Scheduled Meds:    . citalopram  20 mg Oral Daily  .  diltiazem  180 mg Oral Daily  . feeding supplement  237 mL Oral TID BM  . fluconazole  100 mg Oral Daily  . haloperidol  2 mg Oral QHS  . mirtazapine  15 mg Oral QHS  . risperiDONE  4 mg Oral QHS  . DISCONTD: haloperidol  1 mg Oral QHS   Continuous Infusions:    Assessment/Plan: *Delusional disorder: Patient is being managed for this, by primary service.  Active Problems:  1. Oral thrush: Agree with Diflucan therapy, which has already been commenced by primary service. A 7-day course should suffice.  2. Weight Loss: Per patient's history, she has unintentionally, lost an astounding amount of weight in the last 6 months. However, clinically, she has no objective evidence of malnutrition, and is not  anemic.--Her albumin is 3.1, LFT's are elevated ALT to AST indicating possible hepatitis. I've ordered hepatitis titers for her. She states that she's not eaten well since December and I attribute this likely to her oral thrush-before ordering any other tests patient requests no further Ct scans-as she states she was bitten by a puppy and had multiple CT scans.  I would possibly work her up as an outpatient for weight loss and my differential diagnosis is broad and would include delusional disorder with anxiety component versus hepatitis versus rheumatological process, most likely was just infectious and was secondary to oral thrush-given she has not had much change in the pattern of dysphagia, I did discuss her care today with On-call GI Dr. Elnoria Howard who will keep her NPO after midnight and she is on the schedule for tomorrow for endoscopy-if she is stable for d/c from his standpoint and no findings are found on endoscopy, I would discharge her if needed back to the care of her PCP Dr. Shaune Pollack and keep close surveillance on her Weight and work-up for possible occult malignancy as an out-patient  3. Tachycardia: Patient has a sinus tachycardia, as high as 120/min today, and this appears to have been persistent, despite Propranolol therapy-given that she feels a little bit run down today I have cut off her propanolol completely and change her to diltiazem 240 today 6/13 . 12-Lead EKG of 03/17/12, showed SR, at a rate of 114/min. she's been worked up for this in the past by Dr. Dietrich Pates and this is not a new complaint. She had a Holter monitor done for 24 hours 2/13 which was negative and I feel this would be relatively low yield to repeat at present-her echocardiogram done showed an EF 50-55% and no dysfunction therefore I would probably have this reevaluated as an outpatient appointment 4. UTI: Patient has a positive urinary sediment, with Bacteriuria. It appears that primary service has already treated this with  Ciprofloxacin. Urine culture is negative, so far 5. Elevated blood sugar c/out diag of DM-A1c=5.7  Patient will need A1c in 3-6 mo for surveillance as outpt   Code Status: Full Family Communication: None at bedside Disposition Plan: Per psychiatrist and Gastroenterologist-if No findings on Endoscopy, will likely be stable for discharge with further outpatient work-up for weight loss as per condition at review by PCP Dr. Salomon Mast, MD  [facility] Pager [phone] 03/25/2012, 6:33 PM    LOS: 7 days

## 2012-03-26 ENCOUNTER — Encounter (HOSPITAL_COMMUNITY)
Admission: AD | Disposition: A | Discharge status: Home / Self Care | Payer: Self-pay | Source: Ambulatory Visit | Attending: Psychiatry

## 2012-03-26 DIAGNOSIS — I471 Supraventricular tachycardia: Secondary | ICD-10-CM

## 2012-03-26 DIAGNOSIS — F22 Delusional disorders: Secondary | ICD-10-CM

## 2012-03-26 DIAGNOSIS — R1310 Dysphagia, unspecified: Secondary | ICD-10-CM

## 2012-03-26 LAB — COMPREHENSIVE METABOLIC PANEL
ALT: 138 U/L — ABNORMAL HIGH (ref 0–35)
AST: 27 U/L (ref 0–37)
Albumin: 2.9 g/dL — ABNORMAL LOW (ref 3.5–5.2)
CO2: 30 mEq/L (ref 19–32)
Calcium: 9 mg/dL (ref 8.4–10.5)
Creatinine, Ser: 0.64 mg/dL (ref 0.50–1.10)
GFR calc non Af Amer: >90 mL/min (ref >90)
Sodium: 141 mEq/L (ref 135–145)
Total Protein: 5.9 g/dL — ABNORMAL LOW (ref 6.0–8.3)

## 2012-03-26 LAB — HEPATITIS PANEL, ACUTE
HCV Ab: NEGATIVE
Hep B C IgM: NEGATIVE

## 2012-03-26 LAB — HEPATITIS C VRS RNA DETECT BY PCR-QUAL: Hepatitis C Vrs RNA by PCR-Qual: NEGATIVE

## 2012-03-26 SURGERY — EGD (ESOPHAGOGASTRODUODENOSCOPY)
Anesthesia: Moderate Sedation

## 2012-03-26 MED ORDER — HALOPERIDOL 2 MG PO TABS: 2.0000 mg | ORAL_TABLET | Freq: Every day | ORAL | Status: DC

## 2012-03-26 MED ORDER — FLUCONAZOLE 100 MG PO TABS: 100.0000 mg | ORAL_TABLET | Freq: Every day | ORAL | Status: AC

## 2012-03-26 MED ORDER — TRAZODONE HCL 100 MG PO TABS: 100.0000 mg | ORAL_TABLET | Freq: Every evening | ORAL | Status: DC | PRN

## 2012-03-26 MED ORDER — RISPERIDONE 4 MG PO TBDP: 4.0000 mg | ORAL_TABLET | Freq: Every day | ORAL | Status: DC

## 2012-03-26 MED ORDER — CITALOPRAM HYDROBROMIDE 20 MG PO TABS: 20.0000 mg | ORAL_TABLET | Freq: Every day | ORAL | Status: DC

## 2012-03-26 MED ORDER — DILTIAZEM HCL ER COATED BEADS 180 MG PO CP24: 180.0000 mg | ORAL_CAPSULE | Freq: Every day | ORAL | Status: DC

## 2012-03-26 MED ORDER — MIRTAZAPINE 15 MG PO TABS: 15.0000 mg | ORAL_TABLET | Freq: Every day | ORAL | Status: DC

## 2012-03-26 NOTE — Progress Notes (Signed)
Nursing DC 1820 Pt is given DC AVS..., med prescriptions as well as sample meds, all belongings are returned to her and she is then escorted to bldg entrance..( where she is left to wait her transportation home's arrival, per instruction of case manager MGO.)She denies SI, HI, and / or presence of audit, vis, tactile halluc.

## 2012-03-26 NOTE — Progress Notes (Signed)
03/26/2012         Time: 0930      Group Topic/Focus: The focus of the group is on enhancing the patients' ability to cope with stressors by understanding what coping is, why it is important, the negative effects of stress and developing healthier coping skills. Patients practice [place] and discuss how exercise can be used as a healthy coping strategy.   Participation Level: None  Participation Quality: Resistant  Affect: Depressed  Cognitive: Alert  Additional Comments: Patient sitting with her head in her hands the entire group, didn't participate.     Gentry Seeber 03/26/2012 10:09 AM

## 2012-03-26 NOTE — Progress Notes (Signed)
Patient ID: Kayla [name], female   DOB: Aug 19, 1971, 41 y.o.   MRN: [id number] Pt. attended group, but offered little or nothing: Pt. Laid down in a curled fetal position before and after group.  Pt.'s responses to Pt. Self-inventory: Sleep-"Fair"; Appetite-"improving"; energy level-"Low"; attention-"improving"; Depression/Hopelessness-"1/10"; denies lethality; other questions regarding goals after discharge-"when will I go home?". 15:00--Pt. to go home with family--one is coming to pick her up this afternoon.

## 2012-03-26 NOTE — [facility] Suicide Risk Assessment (Signed)
Suicide Risk Assessment  Discharge Assessment     Demographic factors:  Caucasian  Current Mental Status Per Nursing Assessment::   On Admission:    At Discharge:  The patient was AO x 3.  She denied any significant feelings of sadness, anhedonia or depressed mood but did appear mild to moderately depressed.  She adamantly denied any suicidal or homicidal ideations today as well as any auditory or visual hallucinations.  She denies any delusional thinking but continues to have somatic delusions that she is medically ill.   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   The patient was seen today and reports the following:   ADL's: Intact.  Sleep: The patient reports to sleeping well last night but again feels a little oversedated this morning.  Appetite: The patient reports that her appetite is "ok" with no nausea or vomiting today.   Mild>(1-10) >Severe  Hopelessness (1-10): 0  Depression (1-10): 0  Anxiety (1-10): 0   Suicidal Ideation: The patient adamantly denies any suicidal ideations today.  Plan: No  Intent: No  Means: No  Homicidal Ideation: The patient adamantly denies any homicidal ideations today.  Plan: No  Intent: No.  Means: No   General Appearance/Behavior: The patient remained cooperative today with this provider and was less preoccupied with her somatic concerns today.  Eye Contact: Good.  Speech: Appropriate in rate and volume with no pressuring noted today.  Motor Behavior: wnl.  Level of Consciousness: Alert and Oriented x 3.  Mental Status: Alert and Oriented x 3.  Mood: Appears mild to moderately depressed.  Affect: Mild to moderately constricted.  Anxiety Level: No anxiety reported today.  Thought Process: Delusional in her thinking but improving.  Thought Content: The patient denies any auditory or visual hallucinations today. She displays ongoing somatic delusions today but slightly improved.    Perception: Delusional in her thinking but improving.  Judgment: Fair.  Insight: Poor.  Cognition: Oriented to person, place and time.   Loss Factors: Decline in physical health  Historical Factors: Recent significant weight loss over last 6 months.  Multiple medical workups with no clear etiology of weight loss.  Risk Reduction Factors:   Good Family Support.  Good Access to [employer].  Continued Clinical Symptoms:  Depression:   Anhedonia Medical Diagnoses and Treatments/Surgeries Delusional Disorder - Somatic Type.  Discharge Diagnoses:   AXIS I:  Delusional Disorder - Somatic Type.    AXIS II:   Deferred. AXIS III:   1.  Ovarian Cyst.   2.  Tachycardia.   3.  Recent Thrust Infection.   4.  Recent Unexplained Weight Loss.   AXIS IV:   Chronic Mental Illness.  Unexplained Weight Loss. AXIS V:   GAF at time of admission approximately 40.  GAF at time of discharge approximately 45.  Cognitive Features That Contribute To Risk:  Closed-mindedness Polarized thinking Thought constriction (tunnel vision)    Review of Systems:  Neurological: The patient denies any headaches today. She denies any seizures or dizziness.  G.I.: The patient denies any constipation or G.I. Upset today.  Musculoskeletal: The patient denies any muscle or skeletal difficulties.  HEENT: The patient does report an ongoing sore throat which is continuing to improve.   Time was spent today discussing with the patient her current symptoms. The patient states that she is continuing to sleep well at night but feel somewhat oversedated this morning. She reports an "ok" appetite today with no  nausea or vomiting reported. She continues to have her diet supplemented with Ensure.  She is exhibiting mild to moderate feelings of sadness, anhedonia and depressed mood but adamantly denies any suicidal or homicidal ideations. She also denies any anxiety symptoms today. The patient continues to deny any auditory  or visual hallucinations and appears to have less somatic delusions today.   The patient was seen last evening by the Hospitalist who arranged a G.I. appointment at 1 pm today for an Endoscopy.  The patient states that she does not want to have the procedure completed at [facility] but would prefer to have her outside PCP schedule the procedure somewhere closer to home.  The patient also states that she would like to be discharged today to outpatient follow up.  Treatment Plan Summary:  1. Daily contact with patient to assess and evaluate symptoms and progress in treatment.  2. Medication management  3. The patient will deny suicidal ideations or homicidal ideations for 48 hours prior to discharge and have a depression and anxiety rating of 3 or less. The patient will also deny any auditory or visual hallucinations or delusional thinking.  4. The patient will deny any symptoms of substance withdrawal at time of discharge.   Plan:  1. Will continue the medication Risperdal at 4 mgs po qhs for her somatic delusions.  2. Will continue the medication Haldol at 2 mg po qhs to further address her somatic delusions.  3. Will continue the medication Celexa at 20 mgs po q am for depression.  4. Will continue the medication Cipro at 500 mgs po qam and hs for a UTI,  5. Will continue the medication Remeron at 15 mgs po qhs for sleep, appetite and depression.  6. Laboratory Studies reviewed.  7. Will continue to monitor.  8. Will discharge today as requested to outpatient follow up.  Suicide Risk:  Minimal: No identifiable suicidal ideation.  Patients presenting with no risk factors but with morbid ruminations; may be classified as minimal risk based on the severity of the depressive symptoms  Plan Of Care/Follow-up recommendations:  Activity:  As tolerated. Diet:  Regular Diet.  Please supplement with Ensure between meals. Other:  Please take all medications only as directed and keep all scheduled  follow up appointments.  It is important that you keep your appointments with both your PCP and Mental Health.  Janilah Hojnacki 03/26/2012, 11:29 AM

## 2012-03-26 NOTE — Progress Notes (Signed)
[facility] Case Management Discharge Plan:  Will you be returning to the same living situation after discharge: Yes,  with mother and children At discharge, do you have transportation home?:Yes,  with family Do you have the ability to pay for your medications:Yes,  insurance and income  Interagency Information:     Release of information consent forms completed and in the chart;  Patient's signature needed at discharge.  Patient to Follow up at:  Follow-up Information    Follow up with [facility] on 03/29/2012. [facility])    Contact information:   [address] [place], [place]  [address] Telephone:  [phone]      Schedule an appointment as soon as possible for a visit with Dr. Juanetta Gosling. (Please call for the soonest appointment.  A message has been left for [facility] in Dr. Juanetta Gosling' office asking for an appointment.)    Contact information:   Dr. Ramon Dredge L. Hawkins [address] [place]  [address] Telephone:  [phone]         Patient denies SI/HI:   Yes,      Safety Planning and Suicide Prevention discussed:  Yes,  During Aftercare Planning Groups, Case Manager provided psychoeducation on "Suicide Prevention Information."  This included descriptions of risk factors for suicide, warning signs that an individual is in crisis and thinking of suicide, and what to do if this occurs.  Pt indicated understanding of information provided, and will read brochure given upon discharge.     Barrier to discharge identified:No.  Summary and Recommendations:  Patient should go to see her Primary Care Provider as soon as possible, since it is recommended from internal medicine consult that she have an endoscopy and she refuses to have it while in the hospital.  It was attempted to get an appointment prior to her discharge, but Dr. Juanetta Gosling' office closes at 12:00pm on Friday and they did not call back prior to leaving.  Patient should follow up with psychiatrist  at [facility].   Kayla [name] 03/26/2012, 12:14 PM

## 2012-03-26 NOTE — Tx Team (Signed)
Interdisciplinary Treatment Plan Update (Adult)  Date:  03/26/2012  Time Reviewed:  10:15AM-11:15AM  Progress in Treatment: Attending groups:  No Participating in groups:    No Taking medication as prescribed:    Yes, no refusals Tolerating medication:   Yes, no side effects have been reported by patient or noted by staff Family/Significant other contact made:  Yes, with mother Patient understands diagnosis:   Yes, poor insight, poor judgment Discussing patient identified problems/goals with staff:   Yes Medical problems stabilized or resolved:   Yes, is being treated for thrush, wants to have her PCP schedule an endoscopy Denies suicidal/homicidal ideation:  Yes Issues/concerns per patient self-inventory:   None Other:    New problem(s) identified: Yes, Describe:  Internal Medicine scheduled an endoscopy for today, and patient refuses to go.  Reason for Continuation of Hospitalization: None  Interventions implemented related to continuation of hospitalization:  Medication monitoring and adjustment, safety checks Q15 min., suicide risk assessment, group therapy, psychoeducation, collateral contact, aftercare planning, ongoing physician assessments, medication education - UNTIL DISCHARGE  Additional comments:  Not applicable  Estimated length of stay:  Discharge today  Discharge Plan:  Return to her home, transported by family.  Follow up with [facility] in [place], follow up with primary care physician.  New goal(s):  Not applicable  Review of initial/current patient goals per problem list:   1.  Goal(s):  Deny SI for 48 hours prior to D/C.  Met:  Yes  Target date:  By Discharge   As evidenced by:  Has been denying all SI  2.  Goal(s):  Reduce depression, anxiety, and hopelessness to no greater than 3 at discharge.  Met:  Yes  Target date:  By Discharge   As evidenced by:  Has been denying all these symptoms for several days, "0"; however appears to team to be both  depressed and anxious  3.  Goal(s):  Medication stabilization  Met:  Yes  Target date:  By Discharge   As evidenced by:  Stable for discharge  4.  Goal(s):  Reduce psychotic symptoms to baseline per family and patient report.  Met:  Yes  Target date:  By Discharge   As evidenced by:  Although patient remains delusional about medical illnesses, she does appear to be at baseline and her family feels she can come home.    Attendees: Patient:  Kayla [name]  03/26/2012 10:15AM-11:15AM  Family:     Physician:  Dr. Harvie Heck Readling 03/26/2012 10:15AM-11:15AM  Nursing:   Omelia Blackwater, RN 03/26/2012 10:15AM -11:15AM   Case Manager:  Ambrose Mantle, LCSW 03/26/2012 10:15AM-11:15AM  Counselor:  Veto Kemps, MT-BC 03/26/2012 10:15AM-11:15AM  Other:   Shelda Jakes, RN 03/26/2012 10:15AM-11:15AM  Other:   Leighton Parody, RN 03/26/2012 10:15AM-11:15AM  Other:      Other:       Scribe for Treatment Team:   Sarina Ser, 03/26/2012, 10:15AM-11:15AM

## 2012-03-26 NOTE — Progress Notes (Signed)
[facility] Group Notes:  (Counselor/Nursing/MHT/Case Management/Adjunct)  03/26/2012 12:19 PM  Type of Therapy:  Group Therapy  Participation Level:  Did Not Attend   Kayla [name] 03/26/2012, 12:19 PM

## 2012-03-26 NOTE — Progress Notes (Signed)
Pt is in bed resting with her eyes closed   She does not have any complaints and does not appear to be in any distresss  She is npo presently until her procedure tomorrow at 1pm  Will continue Q 15 min checks  Pt safe

## 2012-03-26 NOTE — Progress Notes (Signed)
[facility] brief follow-up note  Followed EMR documentation about patient's refusal to have EGD.  Discussed case with Dr. Allena Katz.  LFT's are trending down-no obvious insult notable-Hepatitis panel unremarkeable.  Most of he rissues are medically stable.  Could possibly increase her Cardizem to 180 Sr 12 hr bid if tachycardia worsens, but unlikely the case EGD can be done as out-patient Cards re-eval might be necessary as out-pt-but has been worked up 2/13 for tachycardia by Dr. Dietrich Pates  Willing to follow patient if new acute medical issues arise, and discussed the same with Dr. Allena Katz, who agrees Appreciate medical consult. My number is [phone] if further concerns are noted by psychiatry service-Am available till 6/18, 8:00 am-8:00 pm  Pleas Koch, MD [facility] Hospitalist [phone]

## 2012-04-01 NOTE — Progress Notes (Signed)
Patient Discharge Instructions:  After Visit Summary (AVS):   Certified Mail to:  04/01/2012 Psychiatric Admission Assessment Note:   Certified Mail to:  04/01/2012 Suicide Risk Assessment - Discharge Assessment:   Certified Mail to:  04/01/2012 Faxed/Sent to the Next Level Care provider:  04/01/2012  Fax number busy/no signal to Arna Medici, sent via certified mail to: [address] [place], [place] [address]  Wandra Scot, 04/01/2012, 10:01 AM

## 2012-04-06 NOTE — Discharge Summary (Signed)
Physician Discharge Summary Note  Patient:  Kayla [name] is an 41 y.o., female MRN:  [id number] DOB:  Jun 23, 1971 Patient phone:  [phone] (home)  Patient address:   [address] 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  AXIS II: Deferred.  AXIS III: 1. Ovarian Cyst.  2. Tachycardia.  3. Recent Thrust Infection.  4. Recent Unexplained Weight Loss.  AXIS IV: Chronic Mental Illness. Unexplained Weight Loss.  AXIS V: GAF at time of admission approximately 40. GAF at time of discharge approximately 45.     Level of Care:  Out patient  Hospital Course:  The patient was admitted for psychotic disorder with complaints of a substantial weight loss since December.  Her UDS was + for amphetamines.  The patient was started on Celexa for anxiety and depression.  Haldol 4mg  was initiated for psychosis, and titrated upward to include Risperdal 2mg  each AM.  Remeron 15mg  was started for sleep and the Trazodone was increased to 100mg  at hs.  Due to the reported weight loss IM was consulted .  The patient had complained about "mouth pain" and was found to have "thrush."  She was treated with Diflucan and her symptoms were resolved.  For results of consult please see IM consult notes.       The patient did well and responded to medications.  Her symptoms resolved to a significant level as the medications were titrated up. On discharge the patient was discharged home to her mother's home with plans to follow up at [facility] as planned.   Consults:  IM please see notes  Significant Diagnostic Studies   See labs  Discharge Vitals:   Blood pressure 120/82, pulse 111, temperature 98 F (36.7 C), temperature source Oral, resp. rate 16, height 5\' 2"  (1.575 m),  weight 68.947 kg (152 lb), SpO2 96.00%.  Mental Status Exam: See Mental Status Examination and Suicide Risk Assessment completed by Attending Physician prior to discharge.  Discharge destination:  Home with her mother and children Is patient on multiple antipsychotic therapies at discharge:  No   Has Patient had three or more failed trials of antipsychotic monotherapy by history:  No Recommended Plan for Multiple Antipsychotic Therapies: not applicable  Discharge Orders    Future Orders Please Complete By Expires   Diet - low sodium heart healthy      Increase activity slowly      Discharge instructions      Comments:   Please take all medications only as directed and keep all scheduled follow up appointments.     Medication List  As of 04/06/2012  9:36 PM   TAKE these medications      Indication    citalopram 20 MG tablet   Commonly known as: CELEXA   Take 1 tablet (20 mg total) by mouth daily. For anxiety and depression.    Indication: Depression      diltiazem 180 MG 24 hr capsule   Commonly known as: CARDIZEM CD   Take 1 capsule (180 mg total) by mouth daily. For hypertension.    Indication: High Blood Pressure      haloperidol 2 MG tablet   Commonly known as: HALDOL   Take 1 tablet (2 mg total) by mouth at bedtime. For psychosis and mental clarity.  Indication: Psychosis      mirtazapine 15 MG tablet   Commonly known as: REMERON   Take 1 tablet (15 mg total) by mouth at bedtime. For insomnia.    Indication: Trouble Sleeping      risperiDONE 4 MG disintegrating tablet   Commonly known as: RISPERDAL M-TABS   Take 1 tablet (4 mg total) by mouth at bedtime. For psychosis and mental clarity and sleep.    Indication: Schizophrenia      traZODone 100 MG tablet   Commonly known as: DESYREL   Take 1 tablet (100 mg total) by mouth at bedtime as needed for sleep. For insomnia.    Indication: Trouble Sleeping           Follow-up Information    Follow up with [facility] on 03/29/2012. [facility])    Contact information:   [address] [place], [place]  [address] Telephone:  [phone]      Schedule an appointment as soon as possible for a visit with Dr. Juanetta Gosling. (Please call for the soonest appointment.  A message has been left for [facility] in Dr. Juanetta Gosling' office asking for an appointment.)    Contact information:   Dr. Ramon Dredge L. Hawkins [address] [place]  [address] Telephone:  [phone]         Follow-up recommendations:  Activity:  as tolerated, heart healthy diet. Diet:     Comments:    Signed: Lloyd Huger T. Gerson Fauth PAC For Dr. Harvie Heck D. Readling 04/06/2012, 9:36 PM

## 2012-05-10 ENCOUNTER — Encounter (HOSPITAL_COMMUNITY): Payer: Self-pay | Admitting: Emergency Medicine

## 2012-05-10 ENCOUNTER — Emergency Department (HOSPITAL_COMMUNITY)
Admission: EM | Admit: 2012-05-10 | Discharge: 2012-05-10 | Disposition: A | Discharge status: Home / Self Care | Payer: Medicaid Other | Attending: Emergency Medicine | Admitting: Emergency Medicine

## 2012-05-10 DIAGNOSIS — R220 Localized swelling, mass and lump, head: Secondary | ICD-10-CM

## 2012-05-10 DIAGNOSIS — R21 Rash and other nonspecific skin eruption: Secondary | ICD-10-CM | POA: Insufficient documentation

## 2012-05-10 MED ORDER — HYDROCORTISONE 1 % EX CREA: TOPICAL_CREAM | CUTANEOUS | Status: DC

## 2012-05-10 NOTE — ED Provider Notes (Signed)
History   This chart was scribed for Kayla Lennert, MD by Sofie Rower. The patient was seen in room TR06C/TR06C and the patient's care was started at 1:27 PM     CSN: [id number]  Arrival date & time 05/10/12  1130   None     Chief Complaint  Patient presents with  . Facial Pain    (Consider location/radiation/quality/duration/timing/severity/associated sxs/prior treatment) Patient is a 41 y.o. female presenting with rash. The history is provided by the patient. No language interpreter was used.  Rash  This is a new problem. The current episode started more than 1 week ago. The problem has not changed since onset.The problem is associated with nothing. There has been no fever. The rash is present on the lips. The pain is mild. The pain has been constant since onset. Associated symptoms include pain. Pertinent negatives include no blisters and no itching. She has tried nothing for the symptoms. The treatment provided no relief.    PCP is Dr. Juanetta Gosling.   Past Medical History  Diagnosis Date  . Ovarian cyst     cervical polyp  . Anxiety     Panic attacks; somatization  . Palpitations     possible bradycardia    Past Surgical History  Procedure Date  . None     Family History  Problem Relation Age of Onset  . Cancer Father   . Heart failure Other   . Hypertension Mother   . Diabetes Mother     History  Substance Use Topics  . Smoking status: Never Smoker   . Smokeless tobacco: Never Used  . Alcohol Use: No    OB History    Grav Para Term Preterm Abortions TAB SAB Ect Mult Living   3 3 3       3       Review of Systems  Skin: Positive for rash. Negative for itching.  All other systems reviewed and are negative.    Allergies  Review of patient's allergies indicates no known allergies.  Home Medications   Current Outpatient Rx  Name Route Sig Dispense Refill  . CITALOPRAM HYDROBROMIDE 20 MG PO TABS Oral Take 1 tablet (20 mg total) by mouth daily. For  anxiety and depression. 30 tablet 0  . DILTIAZEM HCL ER COATED BEADS 180 MG PO CP24 Oral Take 1 capsule (180 mg total) by mouth daily. For hypertension. 30 capsule 0  . MIRTAZAPINE 15 MG PO TABS Oral Take 1 tablet (15 mg total) by mouth at bedtime. For insomnia. 30 tablet 0  . RISPERIDONE 4 MG PO TBDP Oral Take 1 tablet (4 mg total) by mouth at bedtime. For psychosis and mental clarity and sleep. 30 tablet 0  . TRAZODONE HCL 100 MG PO TABS Oral Take 1 tablet (100 mg total) by mouth at bedtime as needed for sleep. For insomnia. 30 tablet 0    BP 129/91  Pulse 112  Temp 98.2 F (36.8 C) (Oral)  Resp 18  SpO2 99%  Physical Exam  Nursing note and vitals reviewed. Constitutional: She is oriented to person, place, and time. She appears well-developed.  HENT:  Head: Normocephalic.       Dry, peeling lips, lower worse than upper.   Eyes: Conjunctivae are normal.  Neck: No tracheal deviation present.  Cardiovascular:  No murmur heard. Musculoskeletal: Normal range of motion.  Neurological: She is oriented to person, place, and time.  Skin: Skin is warm.  Psychiatric:       Pt  appears anxious.     ED Course  Procedures (including critical care time)  DIAGNOSTIC STUDIES: Oxygen Saturation is 99% on room air, normal by my interpretation.    COORDINATION OF CARE:   1:31PM- EDP at bedside discusses treatment plan.   1:43PM- EDP at bedside discusses treatment plan concerning follow up with [facility] for medications.    Labs Reviewed - No data to display No results found.   No diagnosis found.  Pt is  Out of her pysc meds and will go to [facility] tomorrow am to get medicines  MDM      The chart was scribed for me under my direct supervision.  I personally performed the history, physical, and medical decision making and all procedures in the evaluation of this patient.Kayla Lennert, MD 05/10/12 [phone]

## 2012-05-10 NOTE — ED Notes (Signed)
Lip swelling and pain  Since she pulled skin off 2 weeks ago no diff swollowing or breathing

## 2012-05-19 ENCOUNTER — Encounter (HOSPITAL_COMMUNITY): Payer: Self-pay | Admitting: *Deleted

## 2012-05-19 ENCOUNTER — Emergency Department (HOSPITAL_COMMUNITY)
Admission: EM | Admit: 2012-05-19 | Discharge: 2012-05-22 | Disposition: A | Discharge status: Other Acute Inpatient Hospital | Payer: 59 | Source: Home / Self Care | Attending: Emergency Medicine | Admitting: Emergency Medicine

## 2012-05-19 ENCOUNTER — Emergency Department (HOSPITAL_COMMUNITY)
Admission: EM | Admit: 2012-05-19 | Discharge: 2012-05-19 | Disposition: A | Discharge status: Home / Self Care | Payer: 59 | Source: Home / Self Care | Attending: Emergency Medicine | Admitting: Emergency Medicine

## 2012-05-19 DIAGNOSIS — Z79899 Other long term (current) drug therapy: Secondary | ICD-10-CM | POA: Insufficient documentation

## 2012-05-19 DIAGNOSIS — Z91199 Patient's noncompliance with other medical treatment and regimen due to unspecified reason: Secondary | ICD-10-CM | POA: Insufficient documentation

## 2012-05-19 DIAGNOSIS — F450 Somatization disorder: Secondary | ICD-10-CM | POA: Insufficient documentation

## 2012-05-19 DIAGNOSIS — F22 Delusional disorders: Secondary | ICD-10-CM

## 2012-05-19 DIAGNOSIS — Z9114 Patient's other noncompliance with medication regimen: Secondary | ICD-10-CM

## 2012-05-19 DIAGNOSIS — Z9119 Patient's noncompliance with other medical treatment and regimen: Secondary | ICD-10-CM | POA: Insufficient documentation

## 2012-05-19 DIAGNOSIS — R45851 Suicidal ideations: Secondary | ICD-10-CM | POA: Insufficient documentation

## 2012-05-19 HISTORY — DX: Somatization disorder: F45.0

## 2012-05-19 HISTORY — DX: Delusional disorders: F22

## 2012-05-19 LAB — CBC WITH DIFFERENTIAL/PLATELET
Basophils Relative: 0 % (ref 0–1)
Eosinophils Absolute: 0.1 10*3/uL (ref 0.0–0.7)
Eosinophils Relative: 1 % (ref 0–5)
Hemoglobin: 12.5 g/dL (ref 12.0–15.0)
MCH: 27 pg (ref 26.0–34.0)
MCHC: 33.7 g/dL (ref 30.0–36.0)
MCV: 80.1 fL (ref 78.0–100.0)
Monocytes Relative: 12 % (ref 3–12)
Neutrophils Relative %: 67 % (ref 43–77)

## 2012-05-19 LAB — BASIC METABOLIC PANEL
BUN: 11 mg/dL (ref 6–23)
Calcium: 9.8 mg/dL (ref 8.4–10.5)
GFR calc non Af Amer: >90 mL/min (ref >90)
Glucose, Bld: 129 mg/dL — ABNORMAL HIGH (ref 70–99)
Potassium: 3.4 mEq/L — ABNORMAL LOW (ref 3.5–5.1)

## 2012-05-19 LAB — RAPID URINE DRUG SCREEN, HOSP PERFORMED
Barbiturates: NOT DETECTED
Cocaine: NOT DETECTED

## 2012-05-19 LAB — URINALYSIS, ROUTINE W REFLEX MICROSCOPIC
Nitrite: NEGATIVE
Specific Gravity, Urine: >1.03 — ABNORMAL HIGH (ref 1.005–1.030)
Urobilinogen, UA: 1 mg/dL (ref 0.0–1.0)

## 2012-05-19 LAB — URINE MICROSCOPIC-ADD ON

## 2012-05-19 LAB — ETHANOL: Alcohol, Ethyl (B): <11 mg/dL (ref 0–11)

## 2012-05-19 LAB — PREGNANCY, URINE: Preg Test, Ur: NEGATIVE

## 2012-05-19 MED ORDER — HALOPERIDOL 2 MG PO TABS
ORAL_TABLET | ORAL | Status: AC
  Filled 2012-05-19: qty 1

## 2012-05-19 MED ORDER — ACETAMINOPHEN 325 MG PO TABS: 650.0000 mg | ORAL_TABLET | ORAL | Status: DC | PRN

## 2012-05-19 MED ORDER — HALOPERIDOL 2 MG PO TABS
2.0000 mg | ORAL_TABLET | Freq: Every day | ORAL | Status: DC
  Administered 2012-05-19 – 2012-05-20 (×2): 2 mg via ORAL
  Filled 2012-05-19 (×5): qty 1

## 2012-05-19 MED ORDER — ALUM & MAG HYDROXIDE-SIMETH 200-200-20 MG/5ML PO SUSP: 30.0000 mL | ORAL | Status: DC | PRN

## 2012-05-19 MED ORDER — NICOTINE 21 MG/24HR TD PT24: 21.0000 mg | MEDICATED_PATCH | Freq: Every day | TRANSDERMAL | Status: DC | PRN

## 2012-05-19 MED ORDER — LORAZEPAM 1 MG PO TABS: 1.0000 mg | ORAL_TABLET | Freq: Three times a day (TID) | ORAL | Status: DC | PRN

## 2012-05-19 MED ORDER — ZOLPIDEM TARTRATE 5 MG PO TABS: 5.0000 mg | ORAL_TABLET | Freq: Every evening | ORAL | Status: DC | PRN

## 2012-05-19 MED ORDER — IBUPROFEN 400 MG PO TABS: 400.0000 mg | ORAL_TABLET | Freq: Three times a day (TID) | ORAL | Status: DC | PRN

## 2012-05-19 MED ORDER — ONDANSETRON HCL 4 MG PO TABS: 4.0000 mg | ORAL_TABLET | Freq: Three times a day (TID) | ORAL | Status: DC | PRN

## 2012-05-19 NOTE — ED Notes (Signed)
Patient walked out w/out signing out.  MD aware

## 2012-05-19 NOTE — ED Notes (Signed)
Patient is inconsolably crying about "my lip is swollen.  It doesn't look like it normally does. Isn't there an oral surgeon here I can see?"  She continually looks in mirror she brought w/her.  Removed mirror from room, but she came to desk and retrieved it.  She is not redirectable or distractible regarding complaint.  She tried to wander unit looking for MD, but was told she had to stay in her room.  Informed her nothing could be done until MD sees her.  Her lip is not swollen, but there are areas where skin has been picked off her bottom lip. She states she has used all the hydrocortisone cream prescribed by MD in [place].  She keeps her hands at her mouth, pulling at bottom lip.
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
## 2012-05-19 NOTE — ED Notes (Signed)
Pt arrived via ems from home d/t si. Pt now denies si but is telling staff that she is dying from radiation that she got from this hospital and the dentist office. Pt states she wants to go home. Pt reporting swollen bottom lip and a black spot to bottom lip d/t radiation. No abnormal area noted to bottom lip.

## 2012-05-19 NOTE — ED Provider Notes (Signed)
History     CSN: [id number]  Arrival date & time 05/19/12  1047   First MD Initiated Contact with Patient 05/19/12 1121      Chief Complaint  Patient presents with  . Facial Swelling  . possible med clear     (Consider location/radiation/quality/duration/timing/severity/associated sxs/prior treatment) HPI  Past Medical History  Diagnosis Date  . Ovarian cyst     cervical polyp  . Anxiety     Panic attacks; somatization  . Palpitations     possible bradycardia  . Delusional disorder   . Somatization disorder     Past Surgical History  Procedure Date  . None     Family History  Problem Relation Age of Onset  . Cancer Father   . Heart failure Other   . Hypertension Mother   . Diabetes Mother     History  Substance Use Topics  . Smoking status: Never Smoker   . Smokeless tobacco: Never Used  . Alcohol Use: No    OB History    Grav Para Term Preterm Abortions TAB SAB Ect Mult Living   3 3 3       3       Review of Systems  Allergies  Latex  Home Medications  No current outpatient prescriptions on file.  BP 128/101  Pulse 118  Temp 98.7 F (37.1 C) (Oral)  Resp 22  Ht 5\' 4"  (1.626 m)  Wt 140 lb (63.504 kg)  BMI 24.03 kg/m2  SpO2 100%  LMP 05/12/2012  Physical Exam  ED Course  Procedures (including critical care time)  Labs Reviewed - No data to display No results found.   1. Somatic delusion       MDM  I did not see this patient. Donnetta Hutching M.D.        Donnetta Hutching, MD 05/21/12 0930

## 2012-05-19 NOTE — ED Provider Notes (Signed)
History     CSN: [id number]  Arrival date & time 05/19/12  1047   First MD Initiated Contact with Patient 05/19/12 1121      Chief Complaint  Patient presents with  . Facial Swelling  . possible med clear     HPI Pt was seen at 1145.  Per pt, c/o gradual onset and persistence of constant lower lip "not looking right" for the past 3 weeks.  Symptoms began after she "pulled some skin off of it."  Pt describes the her symptoms as "it's swollen in the middle" when she stretches her lip tightly over her bottom teeth.  Pt was eval in the ED approx 1 week ago for same, dx "hydrocortisone cream."  States she "doesn't feel any better."  Denies intra-oral edema, no sore throat, no rash, no fevers, no injury.  Pt has a significant hx of somatic delusional disorder; has been recently eval by [facility] for same.  Pt endorses she has been taking her meds as rx for the past week (was out of them "for a while" before then).  Denies SI, no HI.     Past Medical History  Diagnosis Date  . Ovarian cyst     cervical polyp  . Anxiety     Panic attacks; somatization  . Palpitations     possible bradycardia  . Delusional disorder   . Somatization disorder     Past Surgical History  Procedure Date  . None     Family History  Problem Relation Age of Onset  . Cancer Father   . Heart failure Other   . Hypertension Mother   . Diabetes Mother     History  Substance Use Topics  . Smoking status: Never Smoker   . Smokeless tobacco: Never Used  . Alcohol Use: No    OB History    Grav Para Term Preterm Abortions TAB SAB Ect Mult Living   3 3 3       3       Review of Systems ROS: Statement: All systems negative except as marked or noted in the HPI; Constitutional: Negative for fever and chills. ; ; Eyes: Negative for eye pain, redness and discharge. ; ; ENMT: Negative for ear pain, hoarseness, nasal congestion, sinus pressure and sore throat. ; ; Cardiovascular: Negative for chest pain,  palpitations, diaphoresis, dyspnea and peripheral edema. ; ; Respiratory: Negative for cough, wheezing and stridor. ; ; Gastrointestinal: Negative for nausea, vomiting, diarrhea, abdominal pain, blood in stool, hematemesis, jaundice and rectal bleeding. . ; ; Genitourinary: Negative for dysuria, flank pain and hematuria. ; ; Musculoskeletal: Negative for back pain and neck pain. Negative for swelling and trauma.; ; Skin: Negative for pruritus, rash, abrasions, blisters, bruising and skin lesion.; ; Neuro: Negative for headache, lightheadedness and neck stiffness. Negative for weakness, altered level of consciousness , altered mental status, extremity weakness, paresthesias, involuntary movement, seizure and syncope.; Psych:  +somatization, delusion. No SI, no SA, no HI, no hallucinations.     Allergies  Latex  Home Medications  No current outpatient prescriptions on file.  BP 128/101  Pulse 118  Temp 98.7 F (37.1 C) (Oral)  Resp 22  Ht 5\' 4"  (1.626 m)  Wt 140 lb (63.504 kg)  BMI 24.03 kg/m2  SpO2 100%  LMP 05/12/2012  Physical Exam 1150: Physical examination:  Nursing notes reviewed; Vital signs and O2 SAT reviewed;  Constitutional: Well developed, Well nourished, Well hydrated, In no acute distress; Head:  Normocephalic, atraumatic; Eyes:  EOMI, PERRL, No scleral icterus; ENMT: Mouth and pharynx normal, Mucous membranes moist, +lower lip mildy dry and cracked in several places, no edema, no vesicles, no rash, no bleeding. No intra-oral edema, no hoarse voice, no drooling, no stridor.;; Neck: Supple, Full range of motion, No lymphadenopathy; Cardiovascular: Regular rate and rhythm, No murmur, rub, or gallop; Respiratory: Breath sounds clear & equal bilaterally, No rales, rhonchi, wheezes.  Speaking full sentences with ease, Normal respiratory effort/excursion; Chest: Nontender, Movement normal; Abdomen: Soft, Nontender, Nondistended, Normal bowel sounds;; Extremities: Pulses normal, No  tenderness, No edema, No calf edema or asymmetry.; Neuro: AA&Ox3, Major CN grossly intact.  Speech clear. No gross focal motor or sensory deficits in extremities.; Skin: Color normal, Warm, Dry; Psych:  Anxious, fixed somatic delusion, no SI. 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  States she has started back on her psych meds within the past week.  Continues to perseverate, looking in the mirror, stating her lower lip is "abnormal."  Pt reassured.  Suggested OTC benadryl, lip moisturizer, and to try to limit herself on how many times she looks in the mirror per day.  Pt strongly encouraged to f/u with [facility] and take her psych meds.  Verb understanding. Do not feel there is criteria to admit psychiatrically at this time, as is fixed delusion.  Not hallucinating.  Denies SI.    1215:  Pt apparently walked out of the ED after my eval.        Laray Anger, DO 05/19/12 1932

## 2012-05-19 NOTE — ED Notes (Signed)
Pt came in asking for an oral surgeon, stating her lip has "dropped down and is swelling" Pt states she was seen for same before and EMD told her to use Hydrocortisone cream and states it is much worse. Pt is extremely anxious stating she need someone to fix her lip back. No abnormalities noted to lip.

## 2012-05-19 NOTE — ED Provider Notes (Signed)
History     CSN: [id number]  Arrival date & time 05/19/12  1906   First MD Initiated Contact with Patient 05/19/12 1933      Chief Complaint  Patient presents with  . Suicidal     HPI Pt was seen at 1940.  Per pt and EMS report, c/o gradual onset and persistence of constant SI for the past several hours.  Pt states she is "dying from radiation exposure" which is causing her bottom lip to "swell" and be discolored.  States she has an appt tomorrow with ENT Dr. Suszanne Conners for "a sore throat I've had for 8 months."  Denies hallucinations, no HI.  Pt has significant hx of somatic delusions, with recent admit to [facility] in 03/2012 for same.    Past Medical History  Diagnosis Date  . Ovarian cyst     cervical polyp  . Anxiety     Panic attacks; somatization  . Palpitations     possible bradycardia  . Delusional disorder   . Somatization disorder     Past Surgical History  Procedure Date  . None     Family History  Problem Relation Age of Onset  . Cancer Father   . Heart failure Other   . Hypertension Mother   . Diabetes Mother     History  Substance Use Topics  . Smoking status: Never Smoker   . Smokeless tobacco: Never Used  . Alcohol Use: No    OB History    Grav Para Term Preterm Abortions TAB SAB Ect Mult Living   3 3 3       3       Review of Systems ROS: Statement: All systems negative except as marked or noted in the HPI; Constitutional: Negative for fever and chills. ; ; Eyes: Negative for eye pain, redness and discharge. ; ; ENMT: Negative for ear pain, hoarseness, nasal congestion, sinus pressure and sore throat. ; ; Cardiovascular: Negative for chest pain, palpitations, diaphoresis, dyspnea and peripheral edema. ; ; Respiratory: Negative for cough, wheezing and stridor. ; ; Gastrointestinal: Negative for nausea, vomiting, diarrhea, abdominal pain, blood in stool, hematemesis, jaundice and rectal bleeding. . ; ; Genitourinary: Negative for dysuria, flank pain and  hematuria. ; ; Musculoskeletal: Negative for back pain and neck pain. Negative for swelling and trauma.; ; Skin: Negative for pruritus, rash, abrasions, blisters, bruising and skin lesion.; ; Neuro: Negative for headache, lightheadedness and neck stiffness. Negative for weakness, altered level of consciousness , altered mental status, extremity weakness, paresthesias, involuntary movement, seizure and syncope.; Psych:  +SI, no SA, no HI, no hallucinations, +somatic delusion.      Allergies  Latex  Home Medications   Current Outpatient Rx  Name Route Sig Dispense Refill  . CITALOPRAM HYDROBROMIDE 20 MG PO TABS Oral Take 20 mg by mouth daily. For anxiety/depression FILL DATE: 04/09/2012    . DILTIAZEM HCL ER COATED BEADS 180 MG PO CP24 Oral Take 180 mg by mouth daily. For hypertension FILL DATE: 04/09/2012    . HALOPERIDOL 2 MG PO TABS Oral Take 2 mg by mouth at bedtime. For psychosis and mental clarity FILL DATE: 04/09/2012    . MIRTAZAPINE 15 MG PO TABS Oral Take 15 mg by mouth at bedtime. For insomnia FILL DATE: 04/09/2012    . POTASSIUM CHLORIDE CRYS ER 10 MEQ PO TBCR Oral Take 20 mEq by mouth daily. FILL DATE: 12/29/2011    . RISPERIDONE 4 MG PO TBDP Oral Take 4 mg by mouth  at bedtime. FILL DATE: 04/12/2012    . TRAZODONE HCL 100 MG PO TABS Oral Take 100 mg by mouth at bedtime as needed. For sleep/insomnia  FILL DATE: 04/09/2012      BP 143/94  Pulse 114  Temp 98.1 F (36.7 C) (Oral)  Resp 20  Ht 5\' 4"  (1.626 m)  Wt 130 lb (58.968 kg)  BMI 22.31 kg/m2  SpO2 95%  LMP 05/12/2012  Physical Exam 1945: Physical examination:  Nursing notes reviewed; Vital signs and O2 SAT reviewed;  Constitutional: Well developed, Well nourished, Well hydrated, In no acute distress; Head:  Normocephalic, atraumatic; Eyes: EOMI, PERRL, No scleral icterus; ENMT: Mouth and pharynx normal, Mucous membranes moist.  Pt wearing lipstick appropriately.  No lip swelling, no rash, no vesicles.  No intra-oral edema,  no hoarse voice, no drooling, no stridor. ; Neck: Supple, Full range of motion; Cardiovascular: Regular rate and rhythm; Respiratory: Breath sounds clear, No wheezes.  Speaking full sentences with ease, Normal respiratory effort/excursion; Chest: No deformity, Movement normal; Abdomen: Nondistended, soft; Extremities: No deformity, No edema, No calf edema or asymmetry.; Neuro: AA&Ox3, Major CN grossly intact.  Speech clear. Gait steady. No gross focal motor deficits in extremities.; Skin: Color normal, Warm, Dry.   ED Course  Procedures   1930:  T/C from pt's PMD Dr. Juanetta Gosling, case discussed:  States pt's mother called him to say that pt was on her way to the ED and "needs to be committed."  Pt apparently has NOT been complaint with her psych meds, stating she is "dying from radiation exposure," and threatening to kill herself by "running in front of a car."  Pt's mother:  Lavinia Sharps, [phone].  1945:  IVC paperwork completed, as pt told ED RN she "wanted to go home now."  T/C to [facility], she will come to ED for eval.    MDM  MDM Reviewed: nursing note, vitals and previous chart            Laray Anger, DO 05/20/12 0802

## 2012-05-20 ENCOUNTER — Ambulatory Visit (INDEPENDENT_AMBULATORY_CARE_PROVIDER_SITE_OTHER): Payer: Self-pay | Admitting: Otolaryngology

## 2012-05-20 NOTE — ED Notes (Signed)
Pt states that she has been very depressed lately. States that she wants help. Pt notified of plan to be transferred to [facility] but states that she does not understand why she is going there because the last time she went for depression she believs it was not helpful. Pt resting in bed at this time, sitter with patient

## 2012-05-20 NOTE — ED Notes (Signed)
Gave Sprite and graham crackers
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
## 2012-05-20 NOTE — ED Notes (Signed)
Per Patsy Lager - Pt has been accepted at [facility] - bed will not be available today, should be ready tomorrow.

## 2012-05-20 NOTE — ED Notes (Signed)
Per charge nurse instructions - breakfast tray was left in room.  Patient is sleeping.

## 2012-05-20 NOTE — ED Notes (Signed)
Dinner tray served to patient.

## 2012-05-21 MED ORDER — CITALOPRAM HYDROBROMIDE 20 MG PO TABS
20.0000 mg | ORAL_TABLET | Freq: Every day | ORAL | Status: DC
  Administered 2012-05-21 – 2012-05-22 (×2): 20 mg via ORAL
  Filled 2012-05-21 (×4): qty 1

## 2012-05-21 MED ORDER — TRAZODONE HCL 50 MG PO TABS
ORAL_TABLET | ORAL | Status: AC
  Filled 2012-05-21: qty 2

## 2012-05-21 MED ORDER — DILTIAZEM HCL ER COATED BEADS 180 MG PO CP24
ORAL_CAPSULE | ORAL | Status: AC
  Filled 2012-05-21: qty 1

## 2012-05-21 MED ORDER — RISPERIDONE 1 MG PO TBDP
4.0000 mg | ORAL_TABLET | Freq: Every day | ORAL | Status: DC
  Filled 2012-05-21 (×2): qty 2

## 2012-05-21 MED ORDER — MIRTAZAPINE 15 MG PO TBDP
ORAL_TABLET | ORAL | Status: AC
  Filled 2012-05-21: qty 1

## 2012-05-21 MED ORDER — DIPHENHYDRAMINE HCL 25 MG PO CAPS
50.0000 mg | ORAL_CAPSULE | Freq: Four times a day (QID) | ORAL | Status: DC
  Administered 2012-05-21 – 2012-05-22 (×2): 50 mg via ORAL
  Filled 2012-05-21: qty 2

## 2012-05-21 MED ORDER — SULFAMETHOXAZOLE-TMP DS 800-160 MG PO TABS
ORAL_TABLET | ORAL | Status: AC
  Filled 2012-05-21: qty 1

## 2012-05-21 MED ORDER — SULFAMETHOXAZOLE-TMP DS 800-160 MG PO TABS
1.0000 | ORAL_TABLET | Freq: Two times a day (BID) | ORAL | Status: DC
  Administered 2012-05-21 – 2012-05-22 (×2): 1 via ORAL

## 2012-05-21 MED ORDER — DILTIAZEM HCL ER COATED BEADS 180 MG PO CP24
180.0000 mg | ORAL_CAPSULE | Freq: Every day | ORAL | Status: DC
  Administered 2012-05-21 – 2012-05-22 (×2): 180 mg via ORAL
  Filled 2012-05-21 (×4): qty 1

## 2012-05-21 MED ORDER — MIRTAZAPINE 15 MG PO TABS
15.0000 mg | ORAL_TABLET | Freq: Every day | ORAL | Status: DC
  Filled 2012-05-21 (×3): qty 1

## 2012-05-21 MED ORDER — TRAZODONE HCL 50 MG PO TABS
100.0000 mg | ORAL_TABLET | Freq: Every day | ORAL | Status: DC
  Administered 2012-05-21: 100 mg via ORAL
  Filled 2012-05-21 (×2): qty 1

## 2012-05-21 MED ORDER — CITALOPRAM HYDROBROMIDE 20 MG PO TABS
ORAL_TABLET | ORAL | Status: AC
  Filled 2012-05-21: qty 1

## 2012-05-21 MED ORDER — HALOPERIDOL 2 MG PO TABS
2.0000 mg | ORAL_TABLET | Freq: Every day | ORAL | Status: DC
  Administered 2012-05-21: 2 mg via ORAL
  Filled 2012-05-21 (×2): qty 1

## 2012-05-21 NOTE — ED Notes (Signed)
Patient states her lower lip is hurting states she picked it a week ago and it is swelling. RN aware.

## 2012-05-21 NOTE — ED Notes (Addendum)
Summoned to room by patient. Patient states "My lower lip is itching and swelling. I pulled the skin off of it and it's been itching and swelling." Noted skin removed from lower lip, no bleeding or swelling noted.

## 2012-05-21 NOTE — ED Notes (Addendum)
Pt continues to complain of pain and swelling to bottom lip, states she peeled some skin off of it, also notes she thinks it is due to radiation exposure. lip appears to be red but does not appear swollen. Pt advised to stop continuously rubbing on the lip.

## 2012-05-21 NOTE — ED Provider Notes (Signed)
   This chart was scribed for Ward Givens, MD by Melba Coon. The patient was seen in room APA16A/APA16A and the patient's care was started at 6:19PM. Pt states that she has an obsession with radiation and now she has sores all over her body. Pt also states that she has a lesion on her right forearm but she doesn't know what happened to it.  Physical exam: 3/4  cm round, superficial ulcer on right forearm; scattered lesions on bilateral lower extremities. Has peeling of her lower lip without open lesions.       I was asked to sign her daily IVC papers, which have been signed and notarized. On review of her medications she has not been kept on her home medications, these were started. In addition I am starting her on Septra DS for her scattered skin lesions that are ? Bug bites or MRSA. PT denies scratching but she has made the lesion of her forearm bleed today.    I personally performed the services described in this documentation, which was scribed in my presence. The recorded information has been reviewed and considered.  Devoria Albe, MD, FACEP   Ward Givens, MD 05/21/12 [phone]

## 2012-05-21 NOTE — ED Notes (Signed)
Spoke with Fannie Knee at [facility] - states pt is not on waiting list and they are at capacity with no discharges.  Per Samson Frederic with ACT - pt is 3rd on waiting list for [address] [facility] and will be transferred today.

## 2012-05-21 NOTE — ED Notes (Signed)
AC called for medications. 

## 2012-05-22 ENCOUNTER — Encounter (HOSPITAL_COMMUNITY): Payer: Self-pay | Admitting: *Deleted

## 2012-05-22 ENCOUNTER — Inpatient Hospital Stay (HOSPITAL_COMMUNITY)
Admission: AD | Admit: 2012-05-22 | Discharge: 2012-05-28 | DRG: 885 | Disposition: A | Discharge status: Home / Self Care | Payer: 59 | Attending: Psychiatry | Admitting: Psychiatry

## 2012-05-22 DIAGNOSIS — F22 Delusional disorders: Principal | ICD-10-CM | POA: Diagnosis present

## 2012-05-22 HISTORY — PX: OTHER SURGICAL HISTORY: SHX169

## 2012-05-22 MED ORDER — ALUM & MAG HYDROXIDE-SIMETH 200-200-20 MG/5ML PO SUSP: 30.0000 mL | ORAL | Status: DC | PRN

## 2012-05-22 MED ORDER — MIRTAZAPINE 15 MG PO TABS
15.0000 mg | ORAL_TABLET | Freq: Every day | ORAL | Status: DC
  Administered 2012-05-22 – 2012-05-27 (×6): 15 mg via ORAL
  Filled 2012-05-22: qty 14
  Filled 2012-05-22 (×8): qty 1

## 2012-05-22 MED ORDER — DILTIAZEM HCL ER COATED BEADS 180 MG PO CP24
180.0000 mg | ORAL_CAPSULE | Freq: Every day | ORAL | Status: DC
  Administered 2012-05-23 – 2012-05-28 (×6): 180 mg via ORAL
  Filled 2012-05-22 (×7): qty 1
  Filled 2012-05-22: qty 14
  Filled 2012-05-22: qty 1

## 2012-05-22 MED ORDER — CITALOPRAM HYDROBROMIDE 20 MG PO TABS
20.0000 mg | ORAL_TABLET | Freq: Every day | ORAL | Status: DC
  Administered 2012-05-23 – 2012-05-26 (×4): 20 mg via ORAL
  Filled 2012-05-22 (×7): qty 1

## 2012-05-22 MED ORDER — TRAZODONE HCL 100 MG PO TABS
100.0000 mg | ORAL_TABLET | Freq: Every evening | ORAL | Status: DC | PRN
  Administered 2012-05-22 – 2012-05-23 (×2): 100 mg via ORAL
  Filled 2012-05-22 (×2): qty 1

## 2012-05-22 MED ORDER — HALOPERIDOL 2 MG PO TABS
2.0000 mg | ORAL_TABLET | Freq: Every day | ORAL | Status: DC
  Administered 2012-05-22 – 2012-05-24 (×3): 2 mg via ORAL
  Filled 2012-05-22 (×3): qty 1
  Filled 2012-05-22: qty 2
  Filled 2012-05-22 (×2): qty 1

## 2012-05-22 MED ORDER — MAGNESIUM HYDROXIDE 400 MG/5ML PO SUSP: 30.0000 mL | Freq: Every day | ORAL | Status: DC | PRN

## 2012-05-22 MED ORDER — SULFAMETHOXAZOLE-TMP DS 800-160 MG PO TABS
ORAL_TABLET | ORAL | Status: AC
  Filled 2012-05-22: qty 1

## 2012-05-22 MED ORDER — DIPHENHYDRAMINE HCL 25 MG PO CAPS
ORAL_CAPSULE | ORAL | Status: AC
  Administered 2012-05-22: 50 mg via ORAL
  Filled 2012-05-22: qty 2

## 2012-05-22 MED ORDER — RISPERIDONE 2 MG PO TBDP
4.0000 mg | ORAL_TABLET | Freq: Every day | ORAL | Status: DC
  Administered 2012-05-22 – 2012-05-27 (×6): 4 mg via ORAL
  Filled 2012-05-22 (×3): qty 2
  Filled 2012-05-22: qty 28
  Filled 2012-05-22 (×3): qty 2
  Filled 2012-05-22: qty 4
  Filled 2012-05-22: qty 2

## 2012-05-22 MED ORDER — ACETAMINOPHEN 325 MG PO TABS: 650.0000 mg | ORAL_TABLET | Freq: Four times a day (QID) | ORAL | Status: DC | PRN

## 2012-05-22 NOTE — ED Notes (Signed)
[facility] contacted for patient transport. Awaiting arrival.

## 2012-05-22 NOTE — Progress Notes (Signed)
Patient ID: Kayla [name], female   DOB: 03/27/1971, 41 y.o.   MRN: [id number] 05-22-12 @ 1540 nursing adm note: pt came to [facility] involuntarily with an admitting dx of delusional d/o. She has been in this facility before. He delusion is on several occasions she has stated that radition exposure has caused her lips to burn and turn black, also the inside of her nose has turned black. She has been non complaint with her home medications, no bathing and staying up all night and day looking in the mirror at her face describing how the radiation has burned her face and that she is dying. She also has had some suicidal ideation with a plan to run into traffic. Her si seemed to be more passive at the time of adm. On adm she denied any si/hi/av. She is allergic to latex, had no pain and denied any etoh/drug use. She has a medical hx of ovarian cyst. He labs were: uds negative; urinalysis abnormal; negative pregnancy; bmet wnl except potassium 3.4 and glucose 129; cbc wnl and etoh <11.  Pt was escorted to the [address] and shown the milieu.   Contact person mary huffine at home # [phone] Pharmacy: [facility] at [phone]

## 2012-05-22 NOTE — ED Notes (Signed)
Attempted to call report to [facility]. Shon Baton, RN to call back.

## 2012-05-22 NOTE — ED Notes (Signed)
Report given to Gillermo Murdoch, Charity fundraiser. Ready to receive patient.

## 2012-05-22 NOTE — ED Notes (Signed)
Patient ambulatory to restroom with steady gait. Sitter remains with patient.

## 2012-05-22 NOTE — ED Notes (Signed)
Patient provided breakfast tray. States lip is swollen. No swelling noted, patient noted to pick at lips constantly.

## 2012-05-22 NOTE — Tx Team (Signed)
Initial Interdisciplinary Treatment Plan  PATIENT STRENGTHS: (choose at least two) Supportive family/friends  PATIENT STRESSORS: Medication change or noncompliance   PROBLEM LIST: Problem List/Patient Goals Date to be addressed Date deferred Reason deferred Estimated date of resolution  Delusional d/o 05-22-12           Suicide ideation with no plan 05-22-12                                          DISCHARGE CRITERIA:  Improved stabilization in mood, thinking, and/or behavior Reduction of life-threatening or endangering symptoms to within safe limits Verbal commitment to aftercare and medication compliance  PRELIMINARY DISCHARGE PLAN: Attend aftercare/continuing care group Return to previous living arrangement  PATIENT/FAMIILY INVOLVEMENT: This treatment plan has been presented to and reviewed with the patient, Kayla [name], and/or family member, .  The patient and family have been given the opportunity to ask questions and make suggestions.  [name] 05/22/2012, 4:26 PM

## 2012-05-22 NOTE — ED Notes (Signed)
Pt resting calmly w/ eyes closed. Rise & fall of the chest noted. Sitter at bedside. Bed in low position, side rails up x2. NAD noted at this time.  

## 2012-05-22 NOTE — ED Notes (Signed)
[facility] at bedside for patient transport to [facility].
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
## 2012-05-22 NOTE — ED Notes (Signed)
Pt was in the bathroom when she stated she was dizzy and not feeling well, pt turned an ashen color and assisted back to the room by wheelchair. Pt's nurse notified.

## 2012-05-22 NOTE — ED Notes (Signed)
Patient left ED at this time with [facility].

## 2012-05-22 NOTE — ED Notes (Signed)
Contacted BH assessment spoke w/ Kayla [name] & pt status at this time is to have a room around noon today. No further information received.

## 2012-05-23 LAB — URINALYSIS, MICROSCOPIC ONLY
Glucose, UA: NEGATIVE mg/dL
Ketones, ur: 40 mg/dL — AB
Protein, ur: NEGATIVE mg/dL
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
## 2012-05-23 NOTE — Progress Notes (Signed)
[facility] Adult Inpatient Family/Significant Other Suicide Prevention Education  Suicide Prevention Education:  Education Completed, Corrie Dandy (mother) [phone] has been identified by the patient as the family member/significant other with whom the patient will be residing, and identified as the person(s) who will aid the patient in the event of a mental health crisis (suicidal ideations/suicide attempt).  With written consent from the patient, the family member/significant other has been provided the following suicide prevention education, prior to the and/or following the discharge of the patient.  The suicide prevention education provided includes the following:  Suicide risk factors  Suicide prevention and interventions  National Suicide Hotline telephone number  [facility] assessment telephone number  [facility] Emergency Assistance 911  [facility] and/or Residential Mobile Crisis Unit telephone number  Request made of family/significant other to:  Remove weapons (e.g., guns, rifles, knives), all items previously/currently identified as safety concern.    Remove drugs/medications (over-the-counter, prescriptions, illicit drugs), all items previously/currently identified as a safety concern.  The family member/significant other verbalizes understanding of the suicide prevention education information provided.  The family member/significant other agrees to remove the items of safety concern listed above.  Pt. accepted information on suicide prevention, warning signs to look for with suicide and crisis line numbers to use. The pt. agreed to call crisis line numbers if having warning signs or having thoughts of suicide.  Mother would like to see the pt someplace where the pt is forced to take her medication, for mother reports she cant see well and that she cant make her take the medication. Mother reports that the pt was reporting S/I and had stopped eatting. Mother  would like to see the pt get to a center where medications can be given, mother reported that the pt can't come home.    [facility] 05/23/2012, 3:00 PM

## 2012-05-23 NOTE — [facility] Suicide Risk Assessment (Signed)
Suicide Risk Assessment  Admission Assessment     Demographic factors:  Assessment Details Time of Assessment: Admission Information Obtained From: Patient Current Mental Status:  Current Mental Status:  (denies si/hi/av. ) Loss Factors:  Loss Factors: Decline in physical health Historical Factors:  Historical Factors:  (not applicable ) Risk Reduction Factors:  Risk Reduction Factors: Responsible for children under 40 years of age;Sense of responsibility to family;Religious beliefs about death;Living with another person, especially a relative;Positive social support  CLINICAL FACTORS:   Schizophrenia:   Paranoid or undifferentiated type  COGNITIVE FEATURES THAT CONTRIBUTE TO RISK:  Loss of executive function    SUICIDE RISK:   Mild:  Suicidal ideation of limited frequency, intensity, duration, and specificity.  There are no identifiable plans, no associated intent, mild dysphoria and related symptoms, good self-control (both objective and subjective assessment), few other risk factors, and identifiable protective factors, including available and accessible social support.  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   AXIS IV  economic problems, occupational problems and other psychosocial or environmental problems   AXIS V  11-20 some danger of hurting self or others possible OR occasionally fails to maintain minimal personal hygiene OR gross  impairment in communication   Treatment Plan Summary:  Admit for safety & stabilization  R/O UTI And check for STD repeat UA  Restart meds  Will See if she can be on an ACT team.   Kayla [name] 05/23/2012, 5:43 PM

## 2012-05-23 NOTE — H&P (Signed)
  Pt was seen by me today and I agree with the key elements documented in H&P.  

## 2012-05-23 NOTE — H&P (Signed)
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 Did not make her followup appointment and has been non-compliant with meds.Missed her appointment with ENT MD for eval of "sore throat for 8 mos from radiation" due to going to ED. Told her mother that she had drank Windex trying to kill herself and when that didn't work she planned to jump jump in the pond. Mother requested admission as patient is so delusional.   Past Psychiatric History: Here 6/6-6/14/13 for Delusional Disorder -somatic type and had unexplained weight loss. Probably from not eating due to delusional belief that radiation from Xrays had poisoned her.   Substance Abuse History: UDS neg and no ETOH abuse reported or suspected.   Social History:    reports that she has never smoked. She has never used smokeless tobacco. She reports that she does not drink alcohol or use illicit drugs.  Family Psych History: Please see old chart. Past Medical History:     Past Medical History  Diagnosis Date  . Ovarian cyst     cervical polyp  . Anxiety     Panic attacks; somatization  . Palpitations     possible bradycardia  . Delusional disorder   . Somatization disorder        Past Surgical History  Procedure Date  . None 05-22-12    hx of ovarian cysts    Allergies:  Allergies  Allergen Reactions  . Latex Itching    Current Medications:  Prior to Admission medications   Medication Sig Start Date End Date Taking? Authorizing Provider  citalopram (CELEXA) 20 MG tablet Take 20 mg by mouth daily. For anxiety/depression FILL DATE: 04/09/2012   Yes Historical Provider, MD  diltiazem (CARDIZEM CD) 180 MG 24 hr capsule Take 180 mg by mouth daily. For hypertension FILL DATE: 04/09/2012   Yes  Historical Provider, MD  haloperidol (HALDOL) 2 MG tablet Take 2 mg by mouth at bedtime. For psychosis and mental clarity FILL DATE: 04/09/2012   Yes Historical Provider, MD  mirtazapine (REMERON) 15 MG tablet Take 15 mg by mouth at bedtime. For insomnia FILL DATE: 04/09/2012   Yes Historical Provider, MD  potassium chloride (K-DUR,KLOR-CON) 10 MEQ tablet Take 20 mEq by mouth daily. FILL DATE: 12/29/2011   Yes Historical Provider, MD  traZODone (DESYREL) 100 MG tablet Take 100 mg by mouth at bedtime as needed. For sleep/insomnia  FILL DATE: 04/09/2012   Yes Historical Provider, MD  risperiDONE (RISPERDAL M-TABS) 4 MG disintegrating tablet Take 4 mg by mouth at bedtime. FILL DATE: 04/12/2012    Historical Provider, MD    Mental Status Examination/Evaluation: Objective:  Appearance: Disheveled  Psychomotor Activity:  Normal  Eye Contact::  Fair  Speech:  Clear and Coherent and Normal Rate  Volume:  Normal  Mood:  Anxious   Affect:  Congruent  Thought Process:  Not clear rational or goal oriented   Orientation:  Full  Thought Content:  Delusions  Suicidal Thoughts:  No  Homicidal Thoughts:  No  Judgement:  Impaired  Insight:  Lacking    DIAGNOSIS:    AXIS I Delusional Disorder -somatic type - exacerbation due to non-compliance   AXIS II Deferred  AXIS III See medical history.  AXIS IV economic problems, occupational problems and other psychosocial or environmental problems  AXIS V  11-20 some danger of hurting self or others possible OR occasionally fails to maintain minimal personal hygiene OR gross impairment in communication     Treatment Plan Summary: Admit for safety & stabilization  R/O UTI  And check for STD repeat UA  Restart meds  See if she can be on an ACT team.

## 2012-05-23 NOTE — Progress Notes (Signed)
Psychoeducational Group Note  Date:  05/23/2012 Time:  0945 am  Group Topic/Focus:  Making Healthy Choices:   The focus of this group is to help patients identify negative/unhealthy choices they were using prior to admission and identify positive/healthier coping strategies to replace them upon discharge.  Participation Level:  Did Not Attend  Atticus Lemberger J 05/23/2012, 10:29 AM  

## 2012-05-23 NOTE — Progress Notes (Signed)
[facility] Group Notes:  (Counselor/Nursing/MHT/Case Management/Adjunct)  05/23/2012 11 AM  Type of Therapy:  Aftercare Planning, Group Therapy, Dance/Movement Therapy   Participation Level:  Did Not Attend  Summary of Progress/Problems: pt accepted the daily workbook on healthy support systems.  Kayla [name] 05/23/2012. 11:37 AM  

## 2012-05-23 NOTE — Progress Notes (Signed)
Patient ID: Kayla [name], female   DOB: 27-May-1971, 41 y.o.   MRN: [id number] 05-23-12 nursing shift note: D: pt continue to be preoccupied with her lips being swollen and black. A: rn reoriented pt and advised her that her lips are not swollen and she should refrain from "picking" at them. Also advised her to keep her lips moist with lip balm which she has in her room. R: she still insist they are swollen but she is using her lip balm. rn will monitor and q 15 min cks continue. Pt remains safe.

## 2012-05-23 NOTE — Progress Notes (Signed)
Pt has been isolative to room all evening. Anxious, worried. Refused group. On 1:1 pt remains delusional. Covers mouth and avoids eye contact. Pt's hygiene remains quite poor. Support, reassurance and med education given.  Also gave trazadone to promote sleep. Given ice pack to assist pt with complaints of burning. Pt med compliant and trazadone effective. Pt reports no relief however from ice pack. Denies any SI/HI/AVH at this time and remains safe. Lawrence Marseilles

## 2012-05-24 DIAGNOSIS — F22 Delusional disorders: Secondary | ICD-10-CM

## 2012-05-24 MED ORDER — CEPHALEXIN 250 MG PO CAPS
250.0000 mg | ORAL_CAPSULE | Freq: Three times a day (TID) | ORAL | Status: DC
  Filled 2012-05-24 (×2): qty 1

## 2012-05-24 MED ORDER — CEPHALEXIN 250 MG PO CAPS
250.0000 mg | ORAL_CAPSULE | Freq: Three times a day (TID) | ORAL | Status: DC
  Administered 2012-05-24 – 2012-05-28 (×15): 250 mg via ORAL
  Filled 2012-05-24 (×3): qty 1
  Filled 2012-05-24: qty 24
  Filled 2012-05-24 (×6): qty 1
  Filled 2012-05-24: qty 24
  Filled 2012-05-24 (×2): qty 1
  Filled 2012-05-24 (×2): qty 24
  Filled 2012-05-24 (×8): qty 1

## 2012-05-24 MED ORDER — CEPHALEXIN 250 MG PO CAPS: 250.0000 mg | ORAL_CAPSULE | Freq: Three times a day (TID) | ORAL | Status: DC

## 2012-05-24 NOTE — Progress Notes (Signed)
[facility] Group Notes:  (Counselor/Nursing/MHT/Case Management/Adjunct)  05/24/2012 3:54 PM  Type of Therapy:  Group Therapy  Participation Level:  Did Not Attend    Kayla [name] 05/24/2012, 3:54 PM

## 2012-05-24 NOTE — Progress Notes (Signed)
05/24/2012         Time: 0930      Group Topic/Focus: The focus of this group is on enhancing the patient's understanding of leisure, barriers to leisure, and the importance of engaging in positive leisure activities upon discharge for improved total health.  Participation Level: Did Not Attend  Participation Quality: Not Applicable  Affect: Not Applicable  Cognitive: Not Applicable   Additional Comments: Patient refused group.  Elowen Debruyn 05/24/2012 11:53 AM    

## 2012-05-24 NOTE — Progress Notes (Signed)
Pt remained in room all evening, refused group. She remains anxious, worried and preoccupied about her lips. Keeps her mouth covered when interacting. Hygiene remains poor. Complains of intermittent dizziness. Supported and reassured. Encouraged to shower. Fall precautions strongly encouraged and extensively reviewed. Medicated per orders, trazadone prn for sleep. Pt states she washed her hair only and could not tell this Clinical research associate why. Pt observed hopping up quickly out of bed, may need tub bath tomorrow given lack of compliance with fall precautions. Denies any SI/HI/AVH and states, "I went to the ER for my mouth and before I knew it I was here. I was never suicidal." Continue to monitor closely for falls. Trazadone effective. Lawrence Marseilles

## 2012-05-24 NOTE — Progress Notes (Signed)
Psychoeducational Group Note  Date:  05/24/2012 Time:  20:00 Group Topic/Focus:  Wrap-Up Group:   The focus of this group is to help patients review their daily goal of treatment and discuss progress on daily workbooks.  Participation Level: Did Not Attend  Participation Quality:  Not Applicable  Affect:  Not Applicable  Cognitive:  Not Applicable  Insight:  Not Applicable  Engagement in Group: Not Applicable  Additional Comments:  Kayla [name] 05/24/2012, 8:34 PM

## 2012-05-24 NOTE — Progress Notes (Signed)
Writer met with patient who was in her room.  Patient advised that she wants to discharge.  Writer informed by counselor that patient's mother wants patient placed in a group home or assisted living at discharge.  Writer spoke with mother who advised patient has Medicaid but does not have disability income.  She was informed that without disability income patient will not qualify for placement but a referral can be made for an ACT TEAM as mother reports patient is noncompliant with meds.  Mother was pleased with the recommendations.

## 2012-05-24 NOTE — Progress Notes (Signed)
D.  Pt. Isolative. . Denies SI/HI and denies A/V hallucinations.  Pt. Picks at her lips.  A.  Encouraged pt to attend group.   R.  Pt. Did not attend group.

## 2012-05-24 NOTE — Progress Notes (Signed)
Patient has remained isolative to room; not attending groups or participating in her treatment.  She did attend treatment team meeting today and is upset over her sores.  She remains delusional about the radiation.  Her hygiene is poor and she has remains in bed all day.  She denies any depression; denies any SI/HI/AVH.  She would like to be discharged.  Her mother has concerns about her coming back home to live as she is unable to make sure she takes her medications.  She has expressed that patient may benefit living in a group home.  Patient may also benefit from being followed by an ACT team to be compliant with her meds.  Continue to monitor medication management; collaborate with treatment team members regarding POC for patient.  Safety checks for patients continued every 15 minutes.  Patient is cooperative with staff.  She is redirectable.

## 2012-05-24 NOTE — Progress Notes (Signed)
Psychoeducational Group Note  Date:  05/24/2012 Time:  1100  Group Topic/Focus:  Self Care:   The focus of this group is to help patients understand the importance of self-care in order to improve or restore emotional, physical, spiritual, interpersonal, and financial health.  Participation Level:  Did Not Attend  Participation Quality:    Affect:    Cognitive:    Insight:    Engagement in Group:   Additional Comments:  none  Marquis Lunch, Pleshette Tomasini 05/24/2012, 5:57 PM

## 2012-05-24 NOTE — Progress Notes (Signed)
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   The patient was seen today and reports the following:   ADL's: Intact.  Sleep: The patient reports to sleeping well last night.  Appetite: The patient reports that her appetite is "ok" with no nausea or vomiting today.   Mild>(1-10) >Severe  Hopelessness (1-10): 0  Depression (1-10): 2  Anxiety (1-10): 0   Suicidal Ideation: The patient denies any suicidal ideations today.  Plan: No  Intent: No  Means: No   Homicidal Ideation: The patient denies any homicidal ideations today.  Plan: No  Intent: No.  Means: No   General Appearance/Behavior: The patient was cooperative today with this provider and remained preoccupied with her somatic concerns today.  Eye Contact: Good.  Speech: Appropriate in rate and volume with no pressuring noted today.  Motor Behavior: wnl.  Level of Consciousness: Alert and Oriented x 3.  Mental Status: Alert and Oriented x 3.  Mood: Appears moderately depressed.  Affect: Moderately Constricted.  Anxiety Level: No anxiety reported today.  Thought Process: Delusional in her thinking with somatic delusions noted.  Thought Content: The patient denies any auditory or visual hallucinations today. She is displaying ongoing somatic delusions today.  Perception: Delusional in her thinking.  Judgment: Fair.  Insight: Poor.  Cognition: Oriented to person, place and time.  Sleep:  Number of Hours: 6.75    Vital Signs:Blood pressure 114/74, pulse 131, temperature 98.2 F (36.8 C), temperature source Oral, resp. rate 20, height 5' 3.5" (1.613 m), weight 60.328 kg (133 lb), last menstrual period 05/12/2012, SpO2 97.00%.  Current Medications: Current Facility-Administered Medications  Medication Dose Route Frequency Provider Last Rate Last Dose  . acetaminophen (TYLENOL) tablet 650 mg  650 mg Oral Q6H PRN Jorje Guild, PA-C      . alum & mag hydroxide-simeth  (MAALOX/MYLANTA) 200-200-20 MG/5ML suspension 30 mL  30 mL Oral Q4H PRN Jorje Guild, PA-C      . cephALEXin (KEFLEX) capsule 250 mg  250 mg Oral TID WC & HS Curlene Labrum Jo-Anne Kluth, MD      . citalopram (CELEXA) tablet 20 mg  20 mg Oral Daily Curlene Labrum Iain Sawchuk, MD   20 mg at 05/24/12 0801  . diltiazem (CARDIZEM CD) 24 hr capsule 180 mg  180 mg Oral Daily Curlene Labrum Lorianne Malbrough, MD   180 mg at 05/24/12 0801  . haloperidol (HALDOL) tablet 2 mg  2 mg Oral QHS Curlene Labrum Cotey Rakes, MD   2 mg at 05/23/12 2134  . magnesium hydroxide (MILK OF MAGNESIA) suspension 30 mL  30 mL Oral Daily PRN Jorje Guild, PA-C      . mirtazapine (REMERON) tablet 15 mg  15 mg Oral QHS Curlene Labrum Ji Feldner, MD   15 mg at 05/23/12 2134  . risperiDONE (RISPERDAL M-TABS) disintegrating tablet 4 mg  4 mg Oral QHS Curlene Labrum Sherlie Boyum, MD   4 mg at 05/23/12 2134  . traZODone (DESYREL) tablet 100 mg  100 mg Oral QHS PRN Jorje Guild, PA-C   100 mg at 05/23/12 2134   Lab Results:  Results for orders placed during the hospital encounter of 05/22/12 (from the past 48 hour(s))  URINALYSIS, WITH MICROSCOPIC     Status: Abnormal   Collection Time   05/23/12  7:30 PM      Component Value Range Comment   Color, Urine YELLOW  YELLOW    APPearance CLOUDY (*) CLEAR    Specific Gravity,  Urine 1.031 (*) 1.005 - 1.030    pH 6.0  5.0 - 8.0    Glucose, UA NEGATIVE  NEGATIVE mg/dL    Hgb urine dipstick NEGATIVE  NEGATIVE    Bilirubin Urine SMALL (*) NEGATIVE    Ketones, ur 40 (*) NEGATIVE mg/dL    Protein, ur NEGATIVE  NEGATIVE mg/dL    Urobilinogen, UA 1.0  0.0 - 1.0 mg/dL    Nitrite NEGATIVE  NEGATIVE    Leukocytes, UA SMALL (*) NEGATIVE    WBC, UA 3-6  <3 WBC/hpf    RBC / HPF 0-2  <3 RBC/hpf    Bacteria, UA FEW (*) RARE    Squamous Epithelial / LPF FEW (*) RARE    Urine-Other MUCOUS PRESENT      Physical Findings: AIMS: Facial and Oral Movements Muscles of Facial Expression: None, normal Lips and Perioral Area: None, normal Jaw: None, normal Tongue: None,  normal,Extremity Movements Upper (arms, wrists, hands, fingers): None, normal Lower (legs, knees, ankles, toes): None, normal, Trunk Movements Neck, shoulders, hips: None, normal, Overall Severity Severity of abnormal movements (highest score from questions above): None, normal Incapacitation due to abnormal movements: None, normal Patient's awareness of abnormal movements (rate only patient's report): No Awareness, Dental Status Current problems with teeth and/or dentures?: No Does patient usually wear dentures?: No   Review of Systems:  Neurological: The patient denies any headaches today. She denies any seizures or dizziness.  G.I.: The patient denies any constipation or G.I. Upset today.  Musculoskeletal: The patient denies any muscle or skeletal difficulties.  HEENT: She does report an ongoing sore throat which has occurred for several months. She also reports a "sore on her lip" which cannot be seen by this examiner.  Time was spent today discussing with the patient her current symptoms. The patient states that she is continuing to sleep well at night. She reports an "ok" appetite with no nausea or vomiting reported. She is exhibiting moderate feelings of sadness, anhedonia and depressed mood but denies any suicidal or homicidal ideations. She also denies any anxiety symptoms today. The patient continues to deny any auditory or visual hallucinations and has significant somatic delusions today.   Treatment Plan Summary:  1. Daily contact with patient to assess and evaluate symptoms and progress in treatment.  2. Medication management  3. The patient will deny suicidal ideations or homicidal ideations for 48 hours prior to discharge and have a depression and anxiety rating of 3 or less. The patient will also deny any auditory or visual hallucinations or delusional thinking.  4. The patient will deny any symptoms of substance withdrawal at time of discharge.   Plan:  1. Will continue the  medication Risperdal at 4 mgs po qhs for her somatic delusions.  2. Will continue the medication Haldol at 2 mg po qhs to further address her somatic delusions.  3. Will continue the medication Celexa at 20 mgs po q am for depression.  4. Will start the medication Keflex at 250 mgs po TID-WC and hs x 10 days for cellulitis and possible UTI,  5. Will continue the medication Remeron at 15 mgs po qhs for sleep, appetite and depression.  6. Laboratory Studies reviewed.  7. Will continue to monitor.   Kayla [name] 05/24/2012, 5:58 PM

## 2012-05-25 MED ORDER — PIMOZIDE 2 MG PO TABS
1.0000 mg | ORAL_TABLET | ORAL | Status: DC
  Administered 2012-05-26: 1 mg via ORAL
  Filled 2012-05-25 (×7): qty 1

## 2012-05-25 MED ORDER — ENSURE COMPLETE PO LIQD
237.0000 mL | Freq: Three times a day (TID) | ORAL | Status: DC
  Administered 2012-05-25 – 2012-05-28 (×10): 237 mL via ORAL

## 2012-05-25 NOTE — Progress Notes (Addendum)
Patient remains isolative in her room.  She is delusional regarding her exposure to radiation and her illness due to same.  She is obsessive about the appearance of her mouth and she continues to pick at the sore on her bottom lip.  She has been administered antibiotic ointment for same.  She also has an open sore on her right arm that she has been administered antibiotic ointment and a bandage.  She denies any SI/HI/AVH.  She attended treatment team meeting today.  CM discussed the possibility of her going to a group home and it was decided that she is not eligible for those services.  Patient is not attending groups or participating in her treatment.  Continue to monitor medication management and MD services.  Collaborate with treatment team members regarding POC with patient.  Safety checks continued every 15 minutes.  Reassure and support patient.  Continue to assure patient she is safe on the unit.

## 2012-05-25 NOTE — Discharge Planning (Signed)
Pt did not attend discharge planning group.   

## 2012-05-25 NOTE — Progress Notes (Signed)
[facility] Group Notes:  (Counselor/Nursing/MHT/Case Management/Adjunct)  05/25/2012 2:55 PM  Type of Therapy:  Group Therapy  Participation Level:  Did Not Attend  :   Keily Lepp 05/25/2012, 2:55 PM 

## 2012-05-25 NOTE — Progress Notes (Signed)
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   The patient was seen today and reports the following:   ADL's: Intact.  Sleep: The patient reports to sleeping well last night.  Appetite: The patient reports that her appetite is decreased today.   Mild>(1-10) >Severe  Hopelessness (1-10): 0  Depression (1-10): 0  Anxiety (1-10): 5   Suicidal Ideation: The patient denies any suicidal ideations today.  Plan: No  Intent: No  Means: No   Homicidal Ideation: The patient denies any homicidal ideations today.  Plan: No  Intent: No.  Means: No   General Appearance/Behavior: The patient was cooperative today with this provider but remained preoccupied with her somatic concerns today.  Eye Contact: Good.  Speech: Appropriate in rate and volume with no pressuring noted today.  Motor Behavior: wnl.  Level of Consciousness: Alert and Oriented x 3.  Mental Status: Alert and Oriented x 3.  Mood: Appears moderately depressed.  Affect: Moderately Constricted.  Anxiety Level: Moderate anxiety reported today.  Thought Process: Delusional in her thinking with somatic delusions noted.  Thought Content: The patient denies any auditory or visual hallucinations today. She is displaying ongoing somatic delusions today.  Perception: Delusional in her thinking.  Judgment: Fair.  Insight: Poor.  Cognition: Oriented to person, place and time.  Sleep:  Number of Hours: 6.75    Vital Signs:Blood pressure 119/82, pulse 102, temperature 97.6 F (36.4 C), temperature source Oral, resp. rate 24, height 5' 3.5" (1.613 m), weight 60.328 kg (133 lb), last menstrual period 05/12/2012, SpO2 97.00%.  Current Medications: Current Facility-Administered Medications  Medication Dose Route Frequency Provider Last Rate Last Dose  . acetaminophen (TYLENOL) tablet 650 mg  650 mg Oral Q6H PRN Jorje Guild, PA-C      . alum & mag hydroxide-simeth (MAALOX/MYLANTA)  200-200-20 MG/5ML suspension 30 mL  30 mL Oral Q4H PRN Jorje Guild, PA-C      . cephALEXin (KEFLEX) capsule 250 mg  250 mg Oral TID WC & HS Curlene Labrum Antron Seth, MD   250 mg at 05/25/12 1731  . citalopram (CELEXA) tablet 20 mg  20 mg Oral Daily Curlene Labrum Johnsie Moscoso, MD   20 mg at 05/25/12 0813  . diltiazem (CARDIZEM CD) 24 hr capsule 180 mg  180 mg Oral Daily Curlene Labrum Precious Gilchrest, MD   180 mg at 05/25/12 0813  . feeding supplement (ENSURE COMPLETE) liquid 237 mL  237 mL Oral TID BM Verne Spurr, PA-C   237 mL at 05/25/12 1733  . magnesium hydroxide (MILK OF MAGNESIA) suspension 30 mL  30 mL Oral Daily PRN Jorje Guild, PA-C      . mirtazapine (REMERON) tablet 15 mg  15 mg Oral QHS Curlene Labrum Noora Locascio, MD   15 mg at 05/24/12 2149  . pimozide (ORAP) tablet 1 mg  1 mg Oral BH-qamhs Shyasia Funches D Boby Eyer, MD      . risperiDONE (RISPERDAL M-TABS) disintegrating tablet 4 mg  4 mg Oral QHS Curlene Labrum Lauryl Seyer, MD   4 mg at 05/24/12 2149  . traZODone (DESYREL) tablet 100 mg  100 mg Oral QHS PRN Jorje Guild, PA-C   100 mg at 05/23/12 2134  . DISCONTD: haloperidol (HALDOL) tablet 2 mg  2 mg Oral QHS Curlene Labrum Shontell Prosser, MD   2 mg at 05/24/12 2149   Lab Results:  Results for orders placed during the hospital encounter of 05/22/12 (from the past 48 hour(s))  URINALYSIS, WITH MICROSCOPIC  Status: Abnormal   Collection Time   05/23/12  7:30 PM      Component Value Range Comment   Color, Urine YELLOW  YELLOW    APPearance CLOUDY (*) CLEAR    Specific Gravity, Urine 1.031 (*) 1.005 - 1.030    pH 6.0  5.0 - 8.0    Glucose, UA NEGATIVE  NEGATIVE mg/dL    Hgb urine dipstick NEGATIVE  NEGATIVE    Bilirubin Urine SMALL (*) NEGATIVE    Ketones, ur 40 (*) NEGATIVE mg/dL    Protein, ur NEGATIVE  NEGATIVE mg/dL    Urobilinogen, UA 1.0  0.0 - 1.0 mg/dL    Nitrite NEGATIVE  NEGATIVE    Leukocytes, UA SMALL (*) NEGATIVE    WBC, UA 3-6  <3 WBC/hpf    RBC / HPF 0-2  <3 RBC/hpf    Bacteria, UA FEW (*) RARE    Squamous Epithelial / LPF FEW (*)  RARE    Urine-Other MUCOUS PRESENT      Physical Findings: AIMS: Facial and Oral Movements Muscles of Facial Expression: None, normal Lips and Perioral Area: None, normal Jaw: None, normal Tongue: None, normal,Extremity Movements Upper (arms, wrists, hands, fingers): None, normal Lower (legs, knees, ankles, toes): None, normal, Trunk Movements Neck, shoulders, hips: None, normal, Overall Severity Severity of abnormal movements (highest score from questions above): None, normal Incapacitation due to abnormal movements: None, normal Patient's awareness of abnormal movements (rate only patient's report): No Awareness, Dental Status Current problems with teeth and/or dentures?: No Does patient usually wear dentures?: No   Review of Systems:  Neurological: The patient denies any headaches today. She denies any seizures or dizziness.  G.I.: The patient denies any constipation or G.I. Upset today.  Musculoskeletal: The patient denies any muscle or skeletal difficulties.  HEENT: She does report an ongoing sore throat which has occurred for several months. She also reports a "sore on her lip" which cannot be seen by this examiner.   Time was spent today discussing with the patient her current symptoms. The patient states that she is continuing to sleep well at night. She reports a decreased appetite with no nausea or vomiting reported. She is exhibiting moderate feelings of sadness, anhedonia and depressed mood but denies any suicidal or homicidal ideations. She also reports moderate anxiety symptoms today. The patient continues to deny any auditory or visual hallucinations and has ongoing significant somatic delusions today.   Treatment Plan Summary:  1. Daily contact with patient to assess and evaluate symptoms and progress in treatment.  2. Medication management  3. The patient will deny suicidal ideations or homicidal ideations for 48 hours prior to discharge and have a depression and anxiety  rating of 3 or less. The patient will also deny any auditory or visual hallucinations or delusional thinking.  4. The patient will deny any symptoms of substance withdrawal at time of discharge.   Plan:  1. Will continue the medication Risperdal at 4 mgs po qhs for her somatic delusions.  2. Will discontinue the medication Haldol today. 3. Will start the medication Orap at 1 mg po q am and hs to further address her somatic delusions.  4. Will continue the medication Celexa at 20 mgs po q am for depression.  5. Will continue the medication Keflex at 250 mgs po TID-WC and hs x 10 days for cellulitis and possible UTI,  6. Will continue the medication Remeron at 15 mgs po qhs for sleep, appetite and depression.  7. Laboratory  Studies reviewed.  8. Will continue to monitor.   Jasmeet Gehl 05/25/2012, 7:24 PM

## 2012-05-25 NOTE — Progress Notes (Signed)
Patient resting quietly with eyes closed. Respirations even an unlabored. No distress noted. Q 15 minute check continues to maintain safety

## 2012-05-26 LAB — COMPREHENSIVE METABOLIC PANEL
ALT: 16 U/L (ref 0–35)
AST: 17 U/L (ref 0–37)
Albumin: 3.4 g/dL — ABNORMAL LOW (ref 3.5–5.2)
CO2: 32 mEq/L (ref 19–32)
Calcium: 9.3 mg/dL (ref 8.4–10.5)
Chloride: 100 mEq/L (ref 96–112)
GFR calc non Af Amer: >90 mL/min (ref >90)
Sodium: 142 mEq/L (ref 135–145)

## 2012-05-26 IMAGING — CT CT HEAD W/O CM
1 series · 16 of 30 positions shown, 20 images · non-contrast
Comparison: 07/02/2006

CLINICAL DATA: Headache

CT HEAD WITHOUT CONTRAST
TECHNIQUE: Contiguous axial images were obtained from the base of
the skull through the vertex without contrast

[Series 2: headseq 4.8 h37s · axial · 0.45mm/px · z∈[+101,+236]mm · 16 of 30 slices shown, 20 images]
[im 2/30  brain]
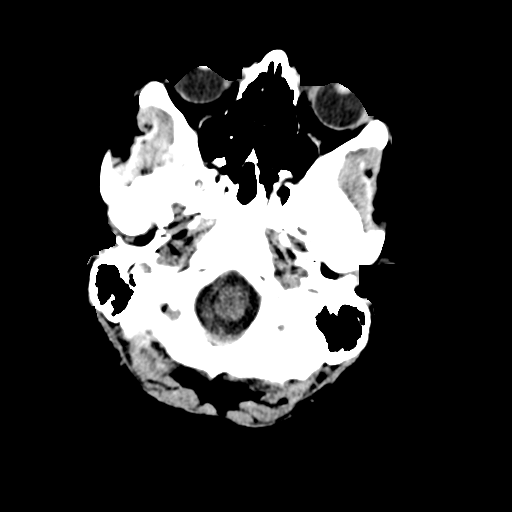
[im 2/30  bone]
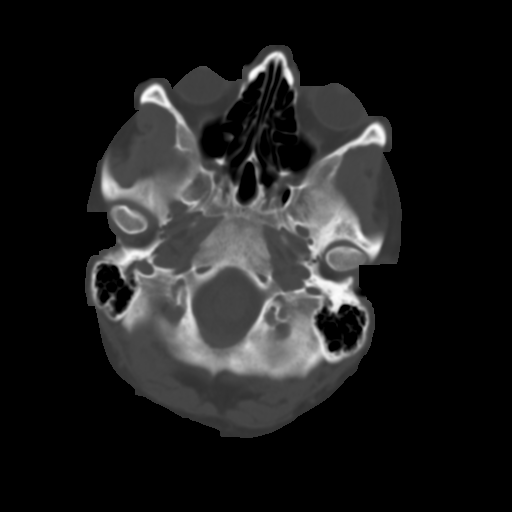
[im 4/30  brain]
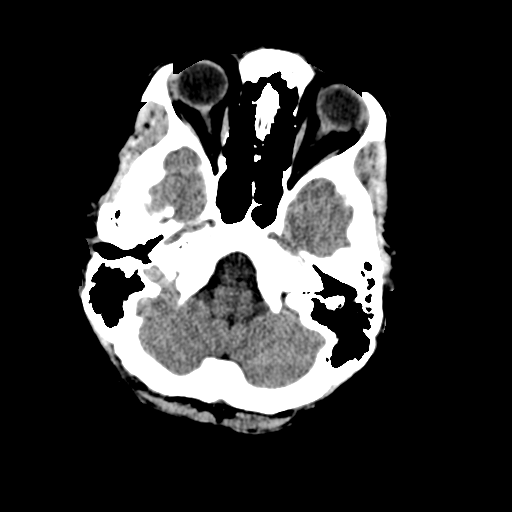
[im 6/30  brain]
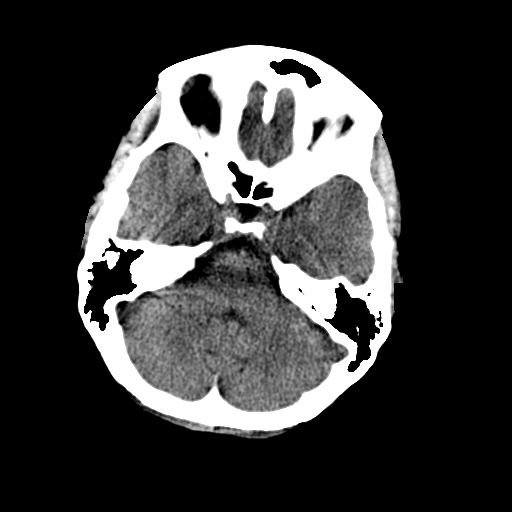
[im 8/30  brain]
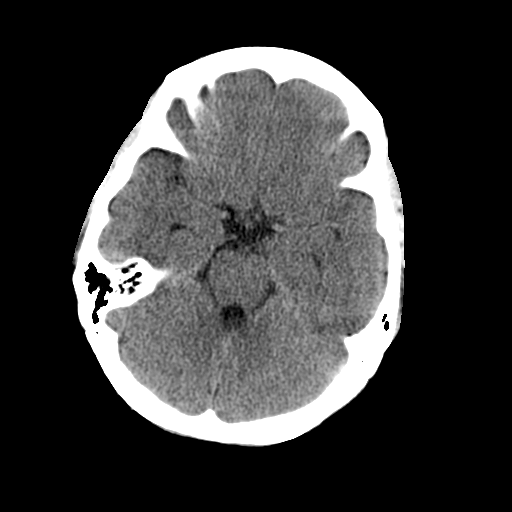
[im 9/30  brain]
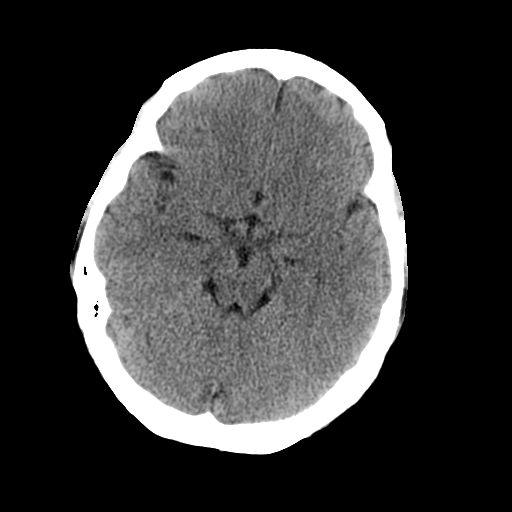
[im 9/30  bone]
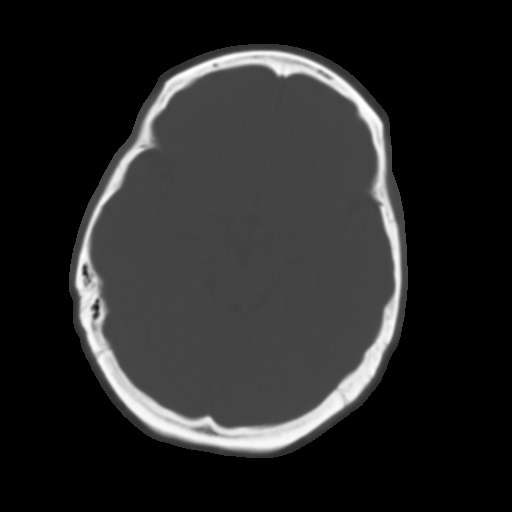
[im 11/30  brain]
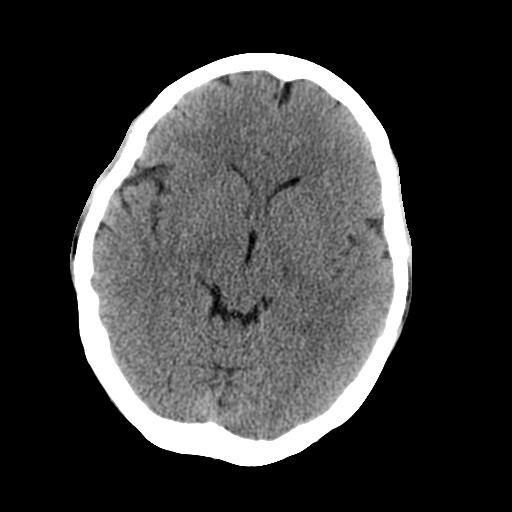
[im 13/30  brain]
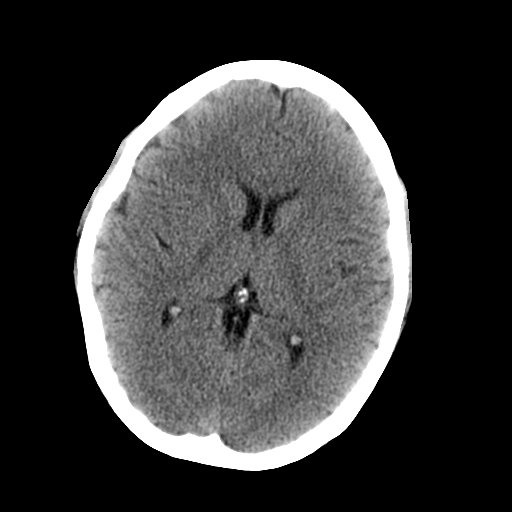
[im 15/30  brain]
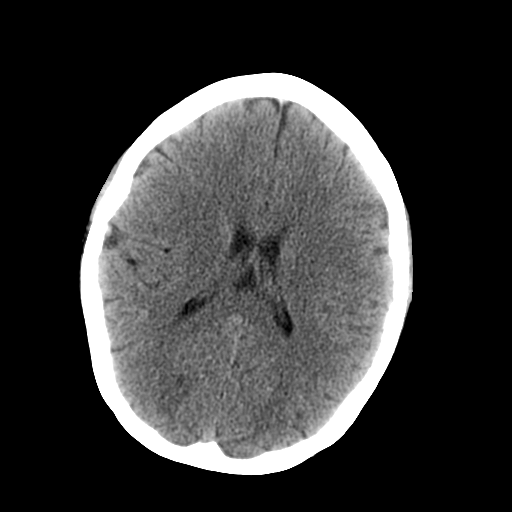
[im 16/30  brain]
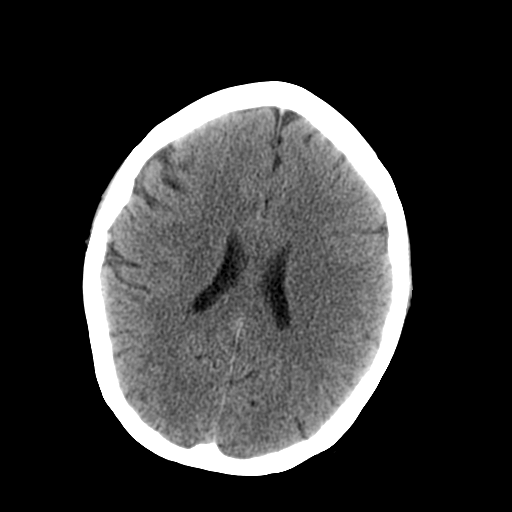
[im 16/30  bone]
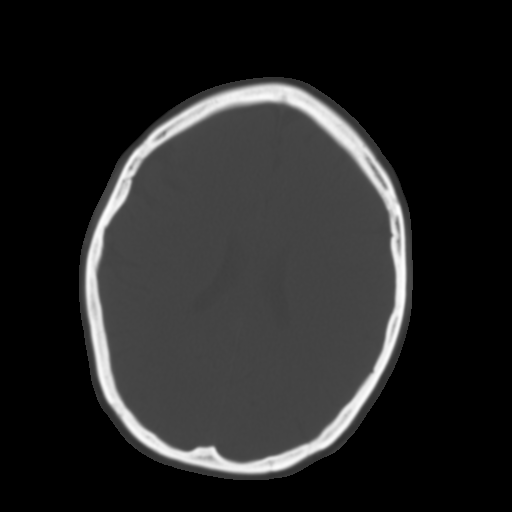
[im 18/30  brain]
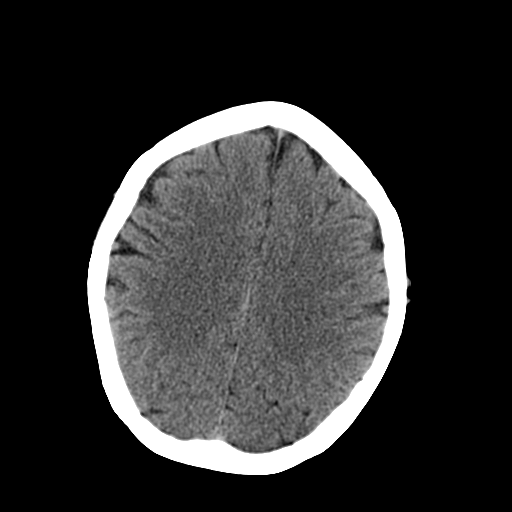
[im 20/30  brain]
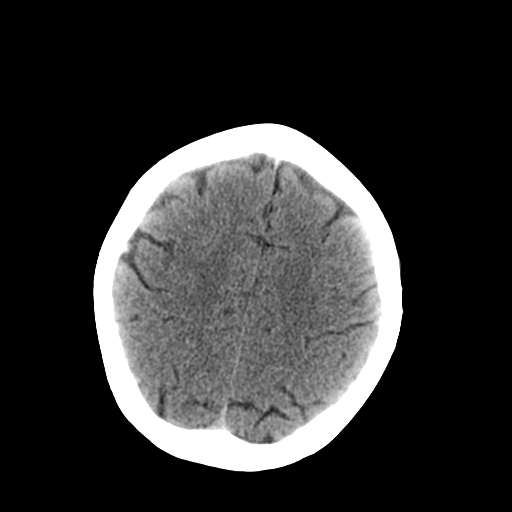
[im 22/30  brain]
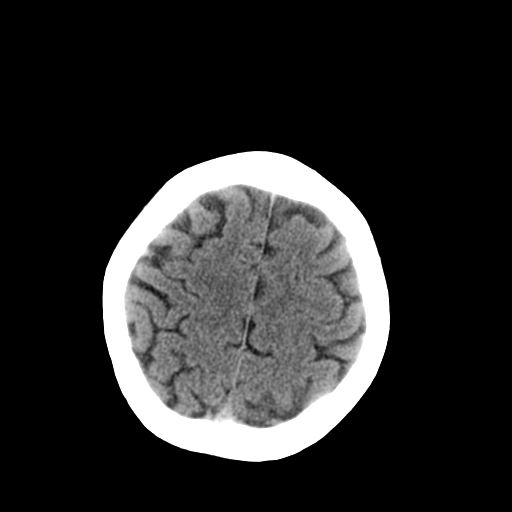
[im 23/30  brain]
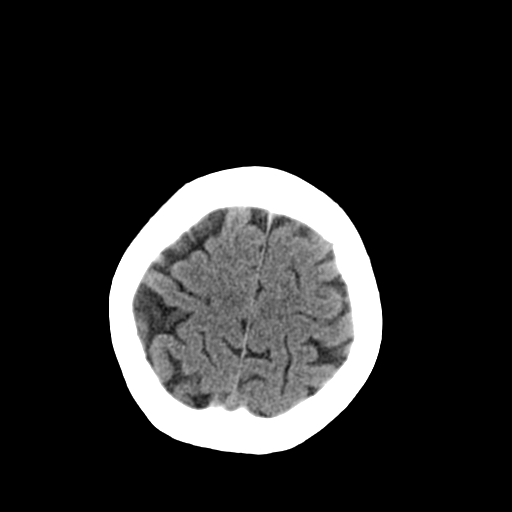
[im 23/30  bone]
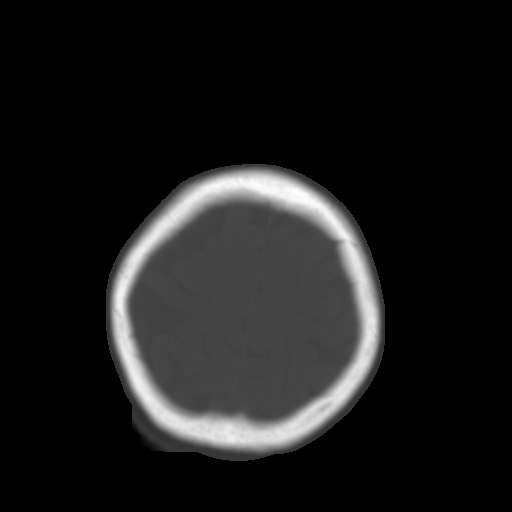
[im 25/30  brain]
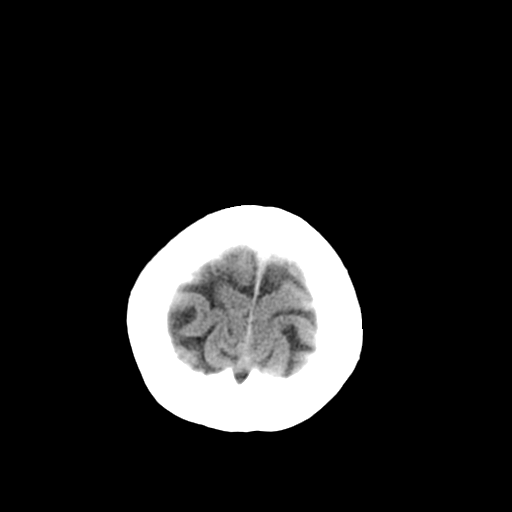
[im 27/30  brain]
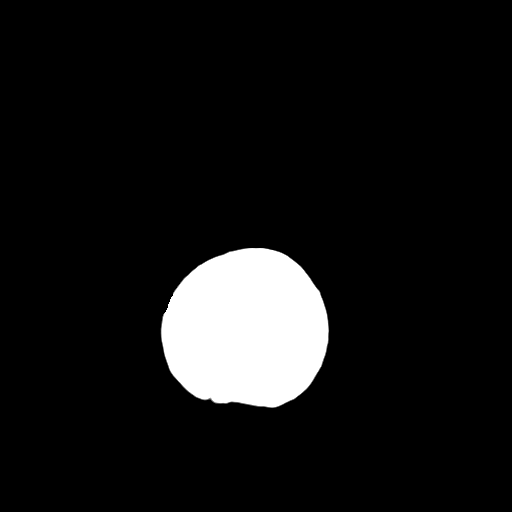
[im 29/30  brain]
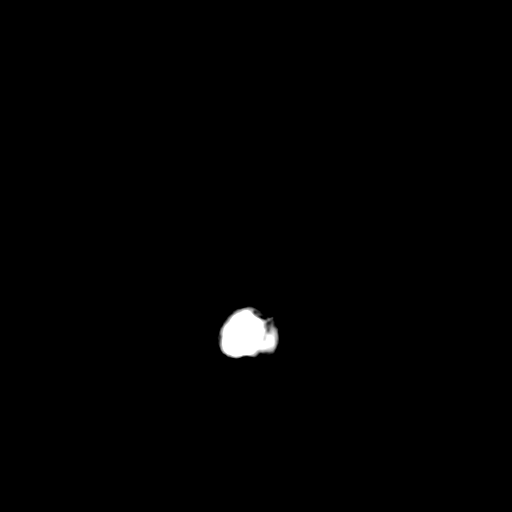

[16 of 30 positions shown; findings below may reference images not displayed]

FINDINGS: The brain has a normal appearance without evidence for
hemorrhage, acute infarction, hydrocephalus, or mass lesion.  There
is no extra axial fluid collection.  The skull and paranasal
sinuses are normal.
IMPRESSION: Normal CT of the head without contrast.

## 2012-05-26 MED ORDER — PIMOZIDE 2 MG PO TABS
2.0000 mg | ORAL_TABLET | ORAL | Status: DC
  Administered 2012-05-26 – 2012-05-27 (×2): 2 mg via ORAL
  Filled 2012-05-26 (×6): qty 1

## 2012-05-26 MED ORDER — CITALOPRAM HYDROBROMIDE 40 MG PO TABS
40.0000 mg | ORAL_TABLET | Freq: Every day | ORAL | Status: DC
  Administered 2012-05-27 – 2012-05-28 (×2): 40 mg via ORAL
  Filled 2012-05-26 (×3): qty 1
  Filled 2012-05-26: qty 14

## 2012-05-26 NOTE — Discharge Planning (Signed)
05/26/2012  Pt did not attend d/c planning group on this date. SW met with pt individually at this time.   Met with pt. In treatment team.  Pt denies hopelessness SI and HI.  Pt stated "she did not know why these things keep happening to her."  Pt. Rated Anxiety 3-4/10.  Pt reported having difficulty sleeping.    Clarice Pole, LCASA 05/26/2012, 3:48 PM

## 2012-05-26 NOTE — Progress Notes (Signed)
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   The patient was seen today and reports the following:   ADL's: Intact.  Sleep: The patient reports to sleeping "ok" last night.  Appetite: The patient reports that her appetite is decreased today.   Mild>(1-10) >Severe  Hopelessness (1-10): 0  Depression (1-10): 0  Anxiety (1-10): 4   Suicidal Ideation: The patient denies any suicidal ideations today.  Plan: No  Intent: No  Means: No   Homicidal Ideation: The patient denies any homicidal ideations today.  Plan: No  Intent: No.  Means: No   General Appearance/Behavior: The patient was cooperative today with this provider but remained preoccupied with her somatic concerns today.  Eye Contact: Good.  Speech: Appropriate in rate and volume with no pressuring noted today.  Motor Behavior: wnl.  Level of Consciousness: Alert and Oriented x 3.  Mental Status: Alert and Oriented x 3.  Mood: Appears moderately depressed.  Affect: Moderately Constricted.  Anxiety Level: Moderate anxiety reported today.  Thought Process: Delusional in her thinking with somatic delusions noted.  Thought Content: The patient denies any auditory or visual hallucinations today. She is displaying ongoing somatic delusions today.  Perception: Delusional in her thinking.  Judgment: Fair.  Insight: Poor.  Cognition: Oriented to person, place and time.  Sleep:  Number of Hours: 6.75    Vital Signs:Blood pressure 96/59, pulse 98, temperature 97.8 F (36.6 C), temperature source Oral, resp. rate 16, height 5' 3.5" (1.613 m), weight 60.328 kg (133 lb), last menstrual period 05/12/2012, SpO2 97.00%.  Current Medications: Current Facility-Administered Medications  Medication Dose Route Frequency Provider Last Rate Last Dose  . acetaminophen (TYLENOL) tablet 650 mg  650 mg Oral Q6H PRN Jorje Guild, PA-C      . alum & mag hydroxide-simeth (MAALOX/MYLANTA)  200-200-20 MG/5ML suspension 30 mL  30 mL Oral Q4H PRN Jorje Guild, PA-C      . cephALEXin (KEFLEX) capsule 250 mg  250 mg Oral TID WC & HS Curlene Labrum Ladawna Walgren, MD   250 mg at 05/26/12 1708  . citalopram (CELEXA) tablet 40 mg  40 mg Oral Daily Curlene Labrum Sharonlee Nine, MD      . diltiazem (CARDIZEM CD) 24 hr capsule 180 mg  180 mg Oral Daily Curlene Labrum Qamar Aughenbaugh, MD   180 mg at 05/26/12 0808  . feeding supplement (ENSURE COMPLETE) liquid 237 mL  237 mL Oral TID BM Curlene Labrum Huntington Leverich, MD   237 mL at 05/26/12 2006  . magnesium hydroxide (MILK OF MAGNESIA) suspension 30 mL  30 mL Oral Daily PRN Jorje Guild, PA-C      . mirtazapine (REMERON) tablet 15 mg  15 mg Oral QHS Curlene Labrum Annah Jasko, MD   15 mg at 05/25/12 2218  . pimozide (ORAP) tablet 2 mg  2 mg Oral BH-qamhs Torrell Krutz D Nylan Nevel, MD      . risperiDONE (RISPERDAL M-TABS) disintegrating tablet 4 mg  4 mg Oral QHS Curlene Labrum Lura Falor, MD   4 mg at 05/25/12 2218  . traZODone (DESYREL) tablet 100 mg  100 mg Oral QHS PRN Jorje Guild, PA-C   100 mg at 05/23/12 2134  . DISCONTD: citalopram (CELEXA) tablet 20 mg  20 mg Oral Daily Curlene Labrum Yanette Tripoli, MD   20 mg at 05/26/12 0808  . DISCONTD: pimozide (ORAP) tablet 1 mg  1 mg Oral BH-qamhs Curlene Labrum Zelma Mazariego, MD   1 mg at 05/26/12 0840  Lab Results: No results found for this or any previous visit (from the past 48 hour(s)).  Physical Findings: AIMS: Facial and Oral Movements Muscles of Facial Expression: None, normal Lips and Perioral Area: None, normal Jaw: None, normal Tongue: None, normal,Extremity Movements Upper (arms, wrists, hands, fingers): None, normal Lower (legs, knees, ankles, toes): None, normal, Trunk Movements Neck, shoulders, hips: None, normal, Overall Severity Severity of abnormal movements (highest score from questions above): None, normal Incapacitation due to abnormal movements: None, normal Patient's awareness of abnormal movements (rate only patient's report): No Awareness, Dental Status Current problems  with teeth and/or dentures?: No Does patient usually wear dentures?: No   Review of Systems:  Neurological: The patient denies any headaches today. She denies any seizures or dizziness.  G.I.: The patient denies any constipation or G.I. Upset today.  Musculoskeletal: The patient denies any muscle or skeletal difficulties.  HEENT: She does report an ongoing sore throat which has occurred for several months. She also reports a "sore on her lip" which cannot be seen by this examiner.   Time was spent today discussing with the patient her current symptoms. The patient states that she slept "ok" last night. She reports a decreased appetite with no nausea or vomiting reported. She is exhibiting moderate feelings of sadness, anhedonia and depressed mood but denies any suicidal or homicidal ideations. She also reports moderate anxiety symptoms today. The patient continues to deny any auditory or visual hallucinations and has ongoing significant somatic delusions today.   Treatment Plan Summary:  1. Daily contact with patient to assess and evaluate symptoms and progress in treatment.  2. Medication management  3. The patient will deny suicidal ideations or homicidal ideations for 48 hours prior to discharge and have a depression and anxiety rating of 3 or less. The patient will also deny any auditory or visual hallucinations or delusional thinking.  4. The patient will deny any symptoms of substance withdrawal at time of discharge.   Plan:  1. Will continue the medication Risperdal at 4 mgs po qhs for her somatic delusions.  2. Will increase the medication Orap to 2 mg po q am and hs to further address her somatic delusions.  3. Will increase the medication Celexa to 40 mgs po q am for depression and obsessive thinking.  4. Will continue the medication Keflex at 250 mgs po TID-WC and hs x 10 days for cellulitis and possible UTI,  5. Will continue the medication Remeron at 15 mgs po qhs for sleep,  appetite and depression.  6. Laboratory Studies reviewed.  7. Will continue to monitor.   Matthan Sledge 05/26/2012, 8:23 PM

## 2012-05-26 NOTE — Progress Notes (Signed)
[facility] Group Notes:  (Counselor/Nursing/MHT/Case Management/Adjunct)  05/26/2012 3:08 PM  Type of Therapy:  Psychoeducational Skills  Participation Level:  Did Not Attend     Kayla [name] 05/26/2012, 3:08 PM

## 2012-05-26 NOTE — Progress Notes (Signed)
Did not attend wrap up group

## 2012-05-26 NOTE — Progress Notes (Addendum)
Pt. States her lips are purple and very swollen. States she is embarrassed to be seen and will not attend group. reassured [pt that her lips were pink in color and were not swollen. Pt denies feeling SI or HI and contracts for safety. Pt states she does not have much of an appetite but did drink the ensure that was ordered. Pt remains isolative and has very little eye contact . When out of her walks around with her hair rapped in a towel. Pt refused to go to lunch staying in her room, When nurse walked in the room pt had red lipstick mall over her lips to cover up"the darkness of her lips and red lipstick on the sores on her arms as well.pt stated she would eat the lunch that was brought back for her.

## 2012-05-26 NOTE — Progress Notes (Signed)
05/26/2012         Time: 0930      Group Topic/Focus: The focus of this group is on discussing various aspects of wellness, balancing those aspects and exploring ways to increase the ability to experience wellness.  Participation Level: Did not attend  Participation Quality: Not Applicable  Affect: Not Applicable  Cognitive: Not Applicable   Additional Comments: Patient refused group, reports feeling nauseous.    Vermelle Cammarata 05/26/2012 10:13 AM

## 2012-05-26 NOTE — Progress Notes (Signed)
D: Patient in room in bed on approach.  Patient forward little and has not been interacting with in the milieu.  Patient states she does not feel good and when asked to elaborate patient states that she is upset about her lip because it is getting darker and nothing is being done about it.  Patient complains of a decreased appetite but drinks ensures when they are scheduled.  Patient states, "I just want to go home."   Patient rates depression and hopelessness 2/10. Denies SI/HI and denies AVH. A: Staff to monitor Q 15 mins for safety.  Support and encouragement offered.  Scheduled medications administered per orders.   R: Patient remains safe on the unit.  Patient remains isolative. Taking administered medications.  Patient did get out of bed to come to the medication window for medications tonight.

## 2012-05-26 NOTE — Progress Notes (Signed)
Psychoeducational Group Note  Date:  05/26/2012 Time:  1100  Group Topic/Focus:  Personal Choices and Values:   The focus of this group is to help patients assess and explore the importance of values in their lives, how their values affect their decisions, how they express their values and what opposes their expression.  Participation Level:  Did Not Attend  Participation Quality:    Affect:    Cognitive:    Insight:    Engagement in Group:    Additional Comments:  Pt had permission from the RN to not attend group. Pt had and headache.  Kayla [name] M 05/26/2012, 11:40 AM

## 2012-05-26 NOTE — Progress Notes (Signed)
D: Patient in room on approach. Patient does not initiate conversations and remains isolative. Patient appears flat and blunted.  Patient speak about lips and how radiation messed up her lips.  Patient states that one day her lips was itching and then the next day it was swollen.  Patient states she does not have any goals to achieve while here and she is sad because she should have gotten radiation. Patient denies SI/HI and denies AVH. Patient has sore to right forearm that is covered with bandaid  A: Offered encouragement and support.  Scheduled medications administered per orders. Staff to monitor Q 15 mins for safety.  Patient instructed not to pick at her lips of sore on right arm. R: Patient remains safe on the unit.  Patient will no come out of her room.  No interaction and will not engage in conversation.  Taking administered medications.  [facility] was contacted times two on 8/13 @ 2215 and 8/14 @ 0008 for Pimozide medication they informed writer that they do not carry it and would have to wait for [facility] pharmacy to open for this medication.

## 2012-05-27 MED ORDER — PIMOZIDE 2 MG PO TABS
3.0000 mg | ORAL_TABLET | ORAL | Status: DC
  Administered 2012-05-27 – 2012-05-28 (×2): 3 mg via ORAL
  Filled 2012-05-27 (×4): qty 2
  Filled 2012-05-27 (×2): qty 42

## 2012-05-27 NOTE — Progress Notes (Signed)
[facility] Group Notes:  (Counselor/Nursing/MHT/Case Management/Adjunct)  05/27/2012 2:42 PM  Type of Therapy:  Group Therapy 9:30, Music therapy 1:15  Participation Level:  Did Not Attend     Kayla [name] 05/27/2012, 2:42 PM

## 2012-05-27 NOTE — Progress Notes (Signed)
Patient continues to have delusional thinking.  Patient isolates in her room; does not attend group or participate in any part of her treatment.  She did attend treatment team meeting and wanted to know when she is going home.  She remains fixated on her lips and their appearance.  She states, "I don't think my lips are ever going to get better."  Patient's hygiene is poor; she appear disheveled and unkept with a towel wrapped around her head.  She denies any depression.  She denies any SI/HI/AVH.  Patient states that she will go home upon discharge to live with her mother.    Continue to monitor medication management and MD orders.  Collaborate with treatment members regarding patient's POC.  Safety checks continued every 15 minutes.  Encourage patient to get out of room to prevent so much isolation.  Encourage to eat part of her meals; drink ensure between meals for better nutrition.  Reassure patient that she is safe on the milieu.

## 2012-05-27 NOTE — Progress Notes (Signed)
Psychoeducational Group Note  Date:  05/27/2012 Time:  2000   Group Topic/Focus:  karaoke  Participation Level: Did Not Attend  Participation Quality:  Not Applicable  Affect:  Not Applicable  Cognitive:  Not Applicable  Insight:  Not Applicable  Engagement in Group: Not Applicable  Additional Comments:  Pt asked if she was going to group, did not respond.  Kaleen Odea R 05/27/2012, 8:41 PM

## 2012-05-27 NOTE — Discharge Planning (Signed)
05/27/2012  Pt did not attend d/c planning group on this date. SW met with pt individually at this time.   Pt. To follow-up with [facility] in [place] for a hospital discharge appointment to receive a clinical assessment prior to referral being made for ACTT services to begin.  SW will continue to assess for referrals.  Met with pt. In treatment team.  Advised pt. ACTT referral had been made to [facility].  Informed pt she would need to go in for hospital discharge appointment.  Informed pt. of pt. care coordinator assigned by [facility] to assist with compliance with appointments.  SW to continue to assess with referrals.  Clarice Pole, LCASA 05/27/2012, 2:56 PM

## 2012-05-27 NOTE — Progress Notes (Signed)
Psychoeducational Group Note  Date:  05/27/2012 Time:  1100  Group Topic/Focus:  Self Esteem Action Plan:   The focus of this group is to help patients create a plan to continue to build self-esteem after discharge.  Participation Level:  Did Not Attend  Participation Quality:    Affect:    Cognitive:    Insight:    Engagement in Group:    Additional Comments:  Pt was sleep. Pt does not attend groups.  Isla Pence M 05/27/2012, 12:10 PM

## 2012-05-27 NOTE — Progress Notes (Signed)
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   The patient was seen today and reports the following:   ADL's: Intact.  Sleep: The patient reports to sleeping "ok" last night.  Appetite: The patient reports that her appetite is "ok" today.   Mild>(1-10) >Severe  Hopelessness (1-10): 0  Depression (1-10): 1-2  Anxiety (1-10): 0   Suicidal Ideation: The patient denies any suicidal ideations today.  Plan: No  Intent: No  Means: No   Homicidal Ideation: The patient denies any homicidal ideations today.  Plan: No  Intent: No.  Means: No   General Appearance/Behavior: The patient was cooperative today with this provider but remained preoccupied with her somatic concerns today.  Eye Contact: Good.  Speech: Appropriate in rate and volume with no pressuring noted today.  Motor Behavior: wnl.  Level of Consciousness: Alert and Oriented x 3.  Mental Status: Alert and Oriented x 3.  Mood: Appears moderately depressed.  Affect: Moderately Constricted.  Anxiety Level: No anxiety reported today.  Thought Process: Delusional in her thinking with somatic delusions noted.  Thought Content: The patient denies any auditory or visual hallucinations today. She is displaying ongoing somatic delusions today.  Perception: Delusional in her thinking.  Judgment: Fair.  Insight: Poor.  Cognition: Oriented to person, place and time.  Sleep:  Number of Hours: 6.75    Vital Signs:Blood pressure 114/81, pulse 88, temperature 98.2 F (36.8 C), temperature source Oral, resp. rate 16, height 5' 3.5" (1.613 m), weight 60.328 kg (133 lb), last menstrual period 05/12/2012, SpO2 97.00%.  Current Medications: Current Facility-Administered Medications  Medication Dose Route Frequency Provider Last Rate Last Dose  . acetaminophen (TYLENOL) tablet 650 mg  650 mg Oral Q6H PRN Jorje Guild, PA-C      . alum & mag hydroxide-simeth (MAALOX/MYLANTA) 200-200-20  MG/5ML suspension 30 mL  30 mL Oral Q4H PRN Jorje Guild, PA-C      . cephALEXin (KEFLEX) capsule 250 mg  250 mg Oral TID WC & HS Curlene Labrum Jeralyn Nolden, MD   250 mg at 05/27/12 1157  . citalopram (CELEXA) tablet 40 mg  40 mg Oral Daily Curlene Labrum Worley Radermacher, MD   40 mg at 05/27/12 0748  . diltiazem (CARDIZEM CD) 24 hr capsule 180 mg  180 mg Oral Daily Curlene Labrum Traeton Bordas, MD   180 mg at 05/27/12 0748  . feeding supplement (ENSURE COMPLETE) liquid 237 mL  237 mL Oral TID BM Curlene Labrum Khaya Theissen, MD   237 mL at 05/27/12 1442  . magnesium hydroxide (MILK OF MAGNESIA) suspension 30 mL  30 mL Oral Daily PRN Jorje Guild, PA-C      . mirtazapine (REMERON) tablet 15 mg  15 mg Oral QHS Curlene Labrum Courtnie Brenes, MD   15 mg at 05/26/12 2138  . pimozide (ORAP) tablet 3 mg  3 mg Oral BH-qamhs Ranferi Clingan D Jayron Maqueda, MD      . risperiDONE (RISPERDAL M-TABS) disintegrating tablet 4 mg  4 mg Oral QHS Curlene Labrum Tarika Mckethan, MD   4 mg at 05/26/12 2138  . traZODone (DESYREL) tablet 100 mg  100 mg Oral QHS PRN Jorje Guild, PA-C   100 mg at 05/23/12 2134  . DISCONTD: citalopram (CELEXA) tablet 20 mg  20 mg Oral Daily Curlene Labrum Deneshia Zucker, MD   20 mg at 05/26/12 0808  . DISCONTD: pimozide (ORAP) tablet 1 mg  1 mg Oral BH-qamhs Curlene Labrum Katelinn Justice, MD   1 mg at 05/26/12  39  . DISCONTD: pimozide (ORAP) tablet 2 mg  2 mg Oral BH-qamhs Curlene Labrum Avril Busser, MD   2 mg at 05/27/12 1610   Lab Results:  Results for orders placed during the hospital encounter of 05/22/12 (from the past 48 hour(s))  COMPREHENSIVE METABOLIC PANEL     Status: Abnormal   Collection Time   05/26/12  8:13 PM      Component Value Range Comment   Sodium 142  135 - 145 mEq/L    Potassium 3.6  3.5 - 5.1 mEq/L    Chloride 100  96 - 112 mEq/L    CO2 32  19 - 32 mEq/L    Glucose, Bld 100 (*) 70 - 99 mg/dL    BUN 12  6 - 23 mg/dL    Creatinine, Ser 9.60  0.50 - 1.10 mg/dL    Calcium 9.3  8.4 - 45.4 mg/dL    Total Protein 6.5  6.0 - 8.3 g/dL    Albumin 3.4 (*) 3.5 - 5.2 g/dL    AST 17  0 - 37 U/L     ALT 16  0 - 35 U/L    Alkaline Phosphatase 62  39 - 117 U/L    Total Bilirubin 0.2 (*) 0.3 - 1.2 mg/dL    GFR calc non Af Amer >90  >90 mL/min    GFR calc Af Amer >90  >90 mL/min    Physical Findings: AIMS: Facial and Oral Movements Muscles of Facial Expression: None, normal Lips and Perioral Area: None, normal Jaw: None, normal Tongue: None, normal,Extremity Movements Upper (arms, wrists, hands, fingers): None, normal Lower (legs, knees, ankles, toes): None, normal, Trunk Movements Neck, shoulders, hips: None, normal, Overall Severity Severity of abnormal movements (highest score from questions above): None, normal Incapacitation due to abnormal movements: None, normal Patient's awareness of abnormal movements (rate only patient's report): No Awareness, Dental Status Current problems with teeth and/or dentures?: No Does patient usually wear dentures?: No   Review of Systems:  Neurological: The patient denies any headaches today. She denies any seizures or dizziness.  G.I.: The patient denies any constipation or G.I. Upset today.  Musculoskeletal: The patient denies any muscle or skeletal difficulties.  HEENT: She does report an ongoing sore throat which has occurred for several months. She also reports a "sore on her lip" which cannot be seen by this examiner.   Time was spent today discussing with the patient her current symptoms. The patient states that she slept "ok" last night. She reports an "ok" appetite with no nausea or vomiting reported. She is exhibiting ongoing moderate feelings of sadness, anhedonia and depressed mood but denies any suicidal or homicidal ideations. She also denies any anxiety symptoms today. The patient continues to deny any auditory or visual hallucinations but has ongoing significant somatic delusions today.   Treatment Plan Summary:  1. Daily contact with patient to assess and evaluate symptoms and progress in treatment.  2. Medication management  3.  The patient will deny suicidal ideations or homicidal ideations for 48 hours prior to discharge and have a depression and anxiety rating of 3 or less. The patient will also deny any auditory or visual hallucinations or delusional thinking.  4. The patient will deny any symptoms of substance withdrawal at time of discharge.   Plan:  1. Will continue the medication Risperdal at 4 mgs po qhs for her somatic delusions.  2. Will increase the medication Orap to 3 mg po q am and hs to  further address her somatic delusions.  3. Will continue the medication Celexa at 40 mgs po q am for depression and obsessive thinking.  4. Will continue the medication Keflex at 250 mgs po TID-WC and hs x 10 days for cellulitis and possible UTI,  5. Will continue the medication Remeron at 15 mgs po qhs for sleep, appetite and depression.  6. Laboratory Studies reviewed.  7. Will continue to monitor.   Camilo Mander 05/27/2012, 4:04 PM

## 2012-05-27 NOTE — Progress Notes (Signed)
D: Patient in bed on approach. Patient preoccupied with her lips stating she wishes they would just get better.  Patient denies depression and hopelessness but appears depressed with blunted affect.  Denies SI/HI and denies AVH.   A: Encouragement and support offered.  Staff to monitor Q 15 mins for safety. Scheduled medications administered per orders.  Patient encouraged to get out of bed to ambulate. R: Patient remains safe on the unit. Taking scheduled medications and did get up to shower tonight. Patient remains delusional and preoccupied with her lips.

## 2012-05-27 NOTE — Progress Notes (Signed)
Patient encouraged to go down to breakfast this morning but she would not get out of bed. Patient states she is fine but will not get out of bed. Will bring breakfast to her room and will encourage her to eat.

## 2012-05-28 MED ORDER — DILTIAZEM HCL ER COATED BEADS 180 MG PO CP24: 180.0000 mg | ORAL_CAPSULE | Freq: Every day | ORAL | Status: AC

## 2012-05-28 MED ORDER — RISPERIDONE 4 MG PO TBDP: 4.0000 mg | ORAL_TABLET | Freq: Every day | ORAL | Status: AC

## 2012-05-28 MED ORDER — CITALOPRAM HYDROBROMIDE 40 MG PO TABS: 40.0000 mg | ORAL_TABLET | Freq: Every day | ORAL | Status: AC

## 2012-05-28 MED ORDER — CEPHALEXIN 250 MG PO CAPS: 250.0000 mg | ORAL_CAPSULE | Freq: Three times a day (TID) | ORAL | Status: AC

## 2012-05-28 MED ORDER — MIRTAZAPINE 15 MG PO TABS: 15.0000 mg | ORAL_TABLET | Freq: Every day | ORAL | Status: AC

## 2012-05-28 MED ORDER — PIMOZIDE 1 MG PO TABS: 3.0000 mg | ORAL_TABLET | ORAL | Status: DC

## 2012-05-28 NOTE — Progress Notes (Signed)
05/28/2012         Time: 0930      Group Topic/Focus: The focus of the group is on enhancing the patients' ability to cope with stressors by understanding what coping is, why it is important, the negative effects of stress and developing healthier coping skills. Patients practice Qi Gong and discuss how exercise can be used as a healthy coping strategy.  Participation Level: Did not attend  Participation Quality: Not Applicable  Affect: Not Applicable  Cognitive: Not Applicable   Additional Comments: Patient refused group. RT provided patient with her Friday workbook for unit programming after group.    [name] 05/28/2012 12:05 PM  

## 2012-05-28 NOTE — Progress Notes (Signed)
[facility] Case Management Discharge Plan:  Will you be returning to the same living situation after discharge: Yes,  return home At discharge, do you have transportation home?:Yes,  son will transport pt home Do you have the ability to pay for your medications:Yes,  access to meds  Interagency Information:     Release of information consent forms completed and in the chart;  Patient's signature needed at discharge.  Patient to Follow up at:  Follow-up Information    Follow up with [facility]. (Pt. to follow-up for a hospital discharge appt for clinical intake.  Pt. is to be referred for ACTT services upon completion of clinical assessment.  )    Contact information:   [address]  [place], [place]  [phone]      Follow up with [facility]. (Pt. was accepted for care coordination [facility].  Isaiah Serge will be the care coordinator assigned and will be following up with patient to assist with ensuring patient is able to keep appts.)    Contact information:   Care Coordination [phone]      Follow up with Arna Medici on 06/01/2012. (Appointment scheduled at 8:30 am, referral # [id number])    Contact information:   [address] [place], [place] [address] [phone]         Patient denies SI/HI:   Yes,  denies SI/HI    Safety Planning and Suicide Prevention discussed:  Yes,  discussed with pt today  Barrier to discharge identified:No.  Summary and Recommendations: Pt attended discharge planning group and actively participated in group.  SW provided pt with today's workbook.  Pt presents with calm mood and affect.  Pt denies having depression, anxiety, SI/HI and A/V hallucinations.  Pt continues to complaina bout her lip being swollen but this appears to be pt's baseline.  Pt reports feeling stable to d/c.  No recommendations from SW.  No further needs voiced by pt.  Pt stable to discharge.     Carmina Miller 05/28/2012, 12:36 PM

## 2012-05-28 NOTE — Progress Notes (Signed)
Patient ID: Kayla [name], female   DOB: 1971/02/18, 41 y.o.   MRN: [id number]  Discharge Note D) Pt is begin d/c'd to her home accompanied by her son. Pt denies SI and HI. Continues to believe that she has a large swollen lip that was caused by radiation that "I did not need". Believes all her problems began with a dog bite. Pt has a fixed delusion, but does not want to hurt herself. A) Fixed Pt;s hair to go home. Given support, reassurance and praise. Given much encouragement. All belongings returned, Pt is aware of her follow up appointment and her medications. Given samples by the Doctor. R) Pt denies SI and HI. Was able to verbalize back to me her follow up appointment and the medications that she is taking.

## 2012-05-28 NOTE — Progress Notes (Signed)
[facility] Group Notes:  (Counselor/Nursing/MHT/Case Management/Adjunct)  05/28/2012 2:19 PM  Type of Therapy:  Group Therapy  Participation Level:  Did Not Attend   Veto Kemps 05/28/2012, 2:19 PM

## 2012-05-28 NOTE — Progress Notes (Signed)
Patient ID: KYE SILVERSTEIN, female   DOB: [birth year], 41 y.o.   MRN: [id number] Patient's mother, Corrie Dandy was called to notify her of patient's discharge. Counselor stressed the importance of follow up and for patient to keep taking her medications . Reported the services that would be provided after she left the hospital including [facility] and ACTT. Mother reported that patient's son Fraser Din would pick her up. She was not happy that patient was leaving with continued concerns about her lip and arms. Reported to her that this was a fixed delusion but that physically her arm was doing better. Also reported to her that patient reported no anxiety, depression or suicidal thoughts.  Mother called again asking about discharge and was told that patient could be picked up after 2:00pm. She was questioning if she had been eating and taking a shower. Reported that patient's hygiene remained poor but she was eating and had opportunity for 3 meals and 3 snacks per day. Reported to her that patient had not taken part in any of the programming while she was here.

## 2012-05-28 NOTE — [facility] Suicide Risk Assessment (Signed)
Suicide Risk Assessment  Discharge Assessment     Demographic factors:  Caucasian;Unemployed  Current Mental Status Per Nursing Assessment::   On Admission:   (denies si/hi/av. ) At Discharge:  Time was spent today discussing with the patient her current symptoms. The patient states that she continues to sleep "ok" last night. She reports an "ok" appetite with no nausea or vomiting reported. She is exhibiting ongoing moderate feelings of sadness, anhedonia and depressed mood but denies any depressive symptoms.  The patient also adamantly denies any suicidal or homicidal ideations. She also denies any anxiety symptoms today. The patient continues to deny any auditory or visual hallucinations but has ongoing somatic delusions today as described above.  The patient is requesting discharge and at this time is not committable.  She is being discharged to outpatient follow up at her request.  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   The patient was seen today and reports the following:   ADL's: Intact.  Sleep: The patient reports to sleeping "ok" last night.  Appetite: The patient reports that her appetite is "ok" today.   Mild>(1-10) >Severe  Hopelessness (1-10): 0  Depression (1-10): 1-2  Anxiety (1-10): 0   Suicidal Ideation: The patient adamantly denies any suicidal ideations today.  Plan: No  Intent: No  Means: No   Homicidal Ideation: The patient adamantly denies any homicidal ideations today.  Plan: No  Intent: No.  Means: No   General Appearance/Behavior: The patient was cooperative today with this provider but remained preoccupied with her somatic concerns today.  Eye Contact: Good.  Speech: Appropriate in rate and volume with no pressuring noted today.  Motor Behavior: wnl.  Level of Consciousness: Alert and Oriented x 3.  Mental Status: Alert and Oriented x 3.  Mood: Appears moderately depressed.  Affect:  Moderately Constricted.  Anxiety Level: No anxiety reported today.  Thought Process: Delusional in her thinking with somatic delusions noted.  Thought Content: The patient denies any auditory or visual hallucinations today. She is displaying ongoing somatic delusions today.  Perception: Delusional in her thinking.  Judgment: Fair.  Insight: Poor.  Cognition: Oriented to person, place and time.   Current Medications:     . cephALEXin  250 mg Oral TID WC & HS  . citalopram  40 mg Oral Daily  . diltiazem  180 mg Oral Daily  . feeding supplement  237 mL Oral TID BM  . mirtazapine  15 mg Oral QHS  . pimozide  3 mg Oral BH-qamhs  . risperiDONE  4 mg Oral QHS  . DISCONTD: pimozide  2 mg Oral BH-qamhs   Loss Factors: Decline in physical health  Financial Constraints.  Historical Factors: Long history of somatic delusions.  Non-compliance with medications.  Limited primary support.  Risk Reduction Factors:   The patient has a Futures trader.  Good access to healthcare.  Continued Clinical Symptoms:  Previous Psychiatric Diagnoses and Treatments Delusional Disorder.  Discharge Diagnoses:   AXIS I:   Delusional Disorder - Somatic Type.  AXIS II:   Deferred AXIS III:   1.  Ovarian Cyst.   2.  History of Heart Palpitations. AXIS IV:  Chronic Mental Illness.  Limited Insight into Illness.  Limited Primary Support.  Corporate treasurer.  Unemployment.  AXIS V:   GAF at time of admission approximately 35.  GAF at time of discharge approximately 50.  Cognitive Features That Contribute To Risk:  Closed-mindedness Polarized thinking Thought  constriction (tunnel vision)    Physical Findings:  AIMS: Facial and Oral Movements  Muscles of Facial Expression: None, normal  Lips and Perioral Area: None, normal  Jaw: None, normal  Tongue: None, normal,Extremity Movements  Upper (arms, wrists, hands, fingers): None, normal  Lower (legs, knees, ankles, toes): None, normal, Trunk Movements    Neck, shoulders, hips: None, normal, Overall Severity  Severity of abnormal movements (highest score from questions above): None, normal  Incapacitation due to abnormal movements: None, normal  Patient's awareness of abnormal movements (rate only patient's report): No Awareness, Dental Status  Current problems with teeth and/or dentures?: No  Does patient usually wear dentures?: No   Review of Systems:  Neurological: The patient denies any headaches today. She denies any seizures or dizziness.  G.I.: The patient denies any constipation or G.I. Upset today.  Musculoskeletal: The patient denies any muscle or skeletal difficulties.  HEENT: She does report an ongoing sore throat which has occurred for several months. She also reports a "sore on her lip" which cannot be seen by this examiner.   Time was spent today discussing with the patient her current symptoms. The patient states that she continues to sleep "ok" last night. She reports an "ok" appetite with no nausea or vomiting reported. She is exhibiting ongoing moderate feelings of sadness, anhedonia and depressed mood but denies any depressive symptoms.  The patient also adamantly denies any suicidal or homicidal ideations. She also denies any anxiety symptoms today. The patient continues to deny any auditory or visual hallucinations but has ongoing somatic delusions today as described above.  The patient is requesting discharge and at this time is not committable.  She is being discharged to outpatient follow up at her request.  Treatment Plan Summary:  1. Daily contact with patient to assess and evaluate symptoms and progress in treatment.  2. Medication management  3. The patient will deny suicidal ideations or homicidal ideations for 48 hours prior to discharge and have a depression and anxiety rating of 3 or less. The patient will also deny any auditory or visual hallucinations or delusional thinking.  4. The patient will deny any  symptoms of substance withdrawal at time of discharge.   Plan:  1. Will continue the medication Risperdal at 4 mgs po qhs for her somatic delusions.  2. Will continue the medication Orap at 3 mg po q am and hs to continue to address her somatic delusions.  3. Will continue the medication Celexa at 40 mgs po q am for depression and obsessive thinking.  4. Will continue the medication Keflex at 250 mgs po TID-WC and hs x 10 days for cellulitis and possible UTI,  5. Will continue the medication Remeron at 15 mgs po qhs for sleep, appetite and depression.  6. Laboratory Studies reviewed.  7. Will continue to monitor.  8. The patient will be discharged today to outpatient follow up at her request.  Suicide Risk:  Minimal: No identifiable suicidal ideation.  Patients presenting with no risk factors but with morbid ruminations; may be classified as minimal risk based on the severity of the depressive symptoms  Plan Of Care/Follow-up recommendations:  Activity:  As tolerated. Diet:  Regular Diet.  Please consider also drinking Ensure with each meal. Other:  Please take all medications only as directed and keep all scheduled follow up appointments.  Kizer Nobbe 05/28/2012, 2:03 PM

## 2012-05-28 NOTE — Tx Team (Signed)
Interdisciplinary Treatment Plan Update (Adult)  Date:  05/28/2012  Time Reviewed:  10:18 AM   Progress in Treatment: Attending groups: Yes Participating in groups:  Yes Taking medication as prescribed: Yes Tolerating medication:  Yes Family/Significant othe contact made:  Counselor will attempt to contact family today Patient understands diagnosis:  Yes Discussing patient identified problems/goals with staff:  Yes Medical problems stabilized or resolved:  Yes Denies suicidal/homicidal ideation: Yes Issues/concerns per patient self-inventory:  None identified Other: N/A  New problem(s) identified: None Identified  Reason for Continuation of Hospitalization: Stable to d/c  Interventions implemented related to continuation of hospitalization: Stable to d/c  Additional comments: N/A  Estimated length of stay: D/C today  Discharge Plan: Pt will follow up with Arna Medici for medication management and therapy.   New goal(s): N/A  Review of initial/current patient goals per problem list:   1.  Goal(s): Reduce depressive and anxiety symptoms  Met:  Yes  Target date: by discharge  As evidenced by: Reducing depression from a 10 to a 3 as reported by pt. Pt rates at a 1 today.    2.  Goal (s): Eliminate Suicidal Ideation  Met:  Yes  Target date: by discharge  As evidenced by: Eliminate suicidal ideation.   3.  Goal(s): Reduce Psychosis  Met:  Yes  Target date: by discharge  As evidenced by: Reduce psychotic symptoms to baseline, as reported by pt. Pt continues to have delusions about her lip but is stable to d/c home today.    4.  Goal(s): Stabilize pt on medications  Met:  Yes  Target date: by discharge  As evidenced by: Pt taking meds regularly and returned to baseline.     Attendees: Patient:  Kayla [name] 05/28/2012 10:20 AM   Family:     Physician:  Franchot Gallo, MD 05/28/2012 10:18 AM   Nursing:   Shelda Jakes, RN 05/28/2012 10:20 AM   Case  Manager:  Reyes Ivan, LCSWA 05/28/2012 10:18 AM   Counselor:  Veto Kemps, MT-BC 05/28/2012 10:18 AM   Other: Lamount Cranker, RN 05/28/2012 10:52 AM   Other:     Other:     Other:      Scribe for Treatment Team:   Carmina Miller, 05/28/2012, 10:18 AM

## 2012-05-31 NOTE — Progress Notes (Signed)
Patient Discharge Instructions:  After Visit Summary (AVS):   Faxed to:  05/31/2012 Psychiatric Admission Assessment Note:   Faxed to:  05/31/2012 Suicide Risk Assessment - Discharge Assessment:   Faxed to:  05/31/2012 Faxed/Sent to the Next Level Care provider:  05/31/2012  Faxed to [facility] @ [phone]  Heloise Purpura, Eduard Clos, 05/31/2012, 2:46 PM

## 2012-06-21 ENCOUNTER — Encounter (HOSPITAL_COMMUNITY): Payer: Self-pay | Admitting: *Deleted

## 2012-06-21 ENCOUNTER — Inpatient Hospital Stay (HOSPITAL_COMMUNITY)
Admission: EM | Admit: 2012-06-21 | Discharge: 2012-06-23 | DRG: 918 | Disposition: A | Discharge status: Home / Self Care | Payer: Medicaid Other | Attending: Pulmonary Disease | Admitting: Pulmonary Disease

## 2012-06-21 DIAGNOSIS — F22 Delusional disorders: Secondary | ICD-10-CM | POA: Diagnosis present

## 2012-06-21 DIAGNOSIS — E871 Hypo-osmolality and hyponatremia: Secondary | ICD-10-CM | POA: Diagnosis present

## 2012-06-21 DIAGNOSIS — I4581 Long QT syndrome: Secondary | ICD-10-CM | POA: Diagnosis present

## 2012-06-21 DIAGNOSIS — Z79899 Other long term (current) drug therapy: Secondary | ICD-10-CM

## 2012-06-21 DIAGNOSIS — G2402 Drug induced acute dystonia: Secondary | ICD-10-CM | POA: Diagnosis present

## 2012-06-21 DIAGNOSIS — F329 Major depressive disorder, single episode, unspecified: Secondary | ICD-10-CM | POA: Diagnosis present

## 2012-06-21 DIAGNOSIS — H5704 Mydriasis: Secondary | ICD-10-CM | POA: Diagnosis present

## 2012-06-21 DIAGNOSIS — F3289 Other specified depressive episodes: Secondary | ICD-10-CM | POA: Diagnosis present

## 2012-06-21 DIAGNOSIS — T43591A Poisoning by other antipsychotics and neuroleptics, accidental (unintentional), initial encounter: Secondary | ICD-10-CM | POA: Diagnosis present

## 2012-06-21 DIAGNOSIS — T43501A Poisoning by unspecified antipsychotics and neuroleptics, accidental (unintentional), initial encounter: Principal | ICD-10-CM | POA: Diagnosis present

## 2012-06-21 DIAGNOSIS — R9431 Abnormal electrocardiogram [ECG] [EKG]: Secondary | ICD-10-CM | POA: Diagnosis present

## 2012-06-21 DIAGNOSIS — Z91040 Latex allergy status: Secondary | ICD-10-CM

## 2012-06-21 DIAGNOSIS — R739 Hyperglycemia, unspecified: Secondary | ICD-10-CM | POA: Diagnosis present

## 2012-06-21 DIAGNOSIS — F410 Panic disorder [episodic paroxysmal anxiety] without agoraphobia: Secondary | ICD-10-CM | POA: Diagnosis present

## 2012-06-21 DIAGNOSIS — F450 Somatization disorder: Secondary | ICD-10-CM | POA: Diagnosis present

## 2012-06-21 DIAGNOSIS — R7309 Other abnormal glucose: Secondary | ICD-10-CM | POA: Diagnosis present

## 2012-06-21 DIAGNOSIS — Y92009 Unspecified place in unspecified non-institutional (private) residence as the place of occurrence of the external cause: Secondary | ICD-10-CM

## 2012-06-21 LAB — URINALYSIS, ROUTINE W REFLEX MICROSCOPIC
Nitrite: NEGATIVE
Specific Gravity, Urine: 1.03 (ref 1.005–1.030)
Urobilinogen, UA: 1 mg/dL (ref 0.0–1.0)
pH: 6 (ref 5.0–8.0)

## 2012-06-21 LAB — BASIC METABOLIC PANEL
BUN: 8 mg/dL (ref 6–23)
Calcium: 10 mg/dL (ref 8.4–10.5)
Creatinine, Ser: 0.52 mg/dL (ref 0.50–1.10)
GFR calc non Af Amer: >90 mL/min (ref >90)
Glucose, Bld: 168 mg/dL — ABNORMAL HIGH (ref 70–99)
Sodium: 133 mEq/L — ABNORMAL LOW (ref 135–145)

## 2012-06-21 LAB — CBC
Hemoglobin: 13.3 g/dL (ref 12.0–15.0)
MCH: 27.1 pg (ref 26.0–34.0)
MCHC: 34.5 g/dL (ref 30.0–36.0)
MCV: 78.4 fL (ref 78.0–100.0)

## 2012-06-21 LAB — RAPID URINE DRUG SCREEN, HOSP PERFORMED
Barbiturates: NOT DETECTED
Cocaine: NOT DETECTED
Tetrahydrocannabinol: NOT DETECTED

## 2012-06-21 LAB — ETHANOL: Alcohol, Ethyl (B): <11 mg/dL (ref 0–11)

## 2012-06-21 LAB — SALICYLATE LEVEL: Salicylate Lvl: <2 mg/dL — ABNORMAL LOW (ref 2.8–20.0)

## 2012-06-21 MED ORDER — SODIUM CHLORIDE 0.9 % IJ SOLN: 3.0000 mL | Freq: Two times a day (BID) | INTRAMUSCULAR | Status: DC

## 2012-06-21 MED ORDER — DILTIAZEM HCL ER COATED BEADS 180 MG PO CP24
180.0000 mg | ORAL_CAPSULE | Freq: Every day | ORAL | Status: DC
  Administered 2012-06-21 – 2012-06-23 (×3): 180 mg via ORAL
  Filled 2012-06-21 (×3): qty 1

## 2012-06-21 MED ORDER — POLYETHYLENE GLYCOL 3350 17 G PO PACK: 17.0000 g | PACK | Freq: Every day | ORAL | Status: DC | PRN

## 2012-06-21 MED ORDER — ONDANSETRON HCL 4 MG/2ML IJ SOLN: 4.0000 mg | Freq: Four times a day (QID) | INTRAMUSCULAR | Status: DC | PRN

## 2012-06-21 MED ORDER — ENOXAPARIN SODIUM 40 MG/0.4ML ~~LOC~~ SOLN
40.0000 mg | SUBCUTANEOUS | Status: DC
  Administered 2012-06-21 – 2012-06-22 (×2): 40 mg via SUBCUTANEOUS
  Filled 2012-06-21 (×2): qty 0.4

## 2012-06-21 MED ORDER — POTASSIUM CHLORIDE CRYS ER 10 MEQ PO TBCR
10.0000 meq | EXTENDED_RELEASE_TABLET | Freq: Two times a day (BID) | ORAL | Status: DC
  Administered 2012-06-21 – 2012-06-23 (×4): 10 meq via ORAL
  Filled 2012-06-21 (×7): qty 1

## 2012-06-21 MED ORDER — SODIUM CHLORIDE 0.9 % IJ SOLN
3.0000 mL | Freq: Two times a day (BID) | INTRAMUSCULAR | Status: DC
  Filled 2012-06-21 (×2): qty 3

## 2012-06-21 MED ORDER — ONDANSETRON HCL 4 MG PO TABS: 4.0000 mg | ORAL_TABLET | Freq: Four times a day (QID) | ORAL | Status: DC | PRN

## 2012-06-21 MED ORDER — POTASSIUM CHLORIDE IN NACL 20-0.9 MEQ/L-% IV SOLN
INTRAVENOUS | Status: DC
  Administered 2012-06-21 – 2012-06-22 (×3): via INTRAVENOUS

## 2012-06-21 MED ORDER — ACETAMINOPHEN 325 MG PO TABS: 650.0000 mg | ORAL_TABLET | Freq: Four times a day (QID) | ORAL | Status: DC | PRN

## 2012-06-21 MED ORDER — BENZTROPINE MESYLATE 1 MG PO TABS
1.0000 mg | ORAL_TABLET | Freq: Two times a day (BID) | ORAL | Status: DC
  Administered 2012-06-21 – 2012-06-23 (×5): 1 mg via ORAL
  Filled 2012-06-21 (×5): qty 1

## 2012-06-21 MED ORDER — LORAZEPAM 1 MG PO TABS
ORAL_TABLET | ORAL | Status: AC
  Administered 2012-06-21: 1 mg via ORAL
  Filled 2012-06-21: qty 1

## 2012-06-21 MED ORDER — ALUM & MAG HYDROXIDE-SIMETH 200-200-20 MG/5ML PO SUSP: 30.0000 mL | Freq: Four times a day (QID) | ORAL | Status: DC | PRN

## 2012-06-21 MED ORDER — CITALOPRAM HYDROBROMIDE 20 MG PO TABS
40.0000 mg | ORAL_TABLET | Freq: Every day | ORAL | Status: DC
  Administered 2012-06-21 – 2012-06-23 (×3): 40 mg via ORAL
  Filled 2012-06-21 (×3): qty 2

## 2012-06-21 MED ORDER — LORAZEPAM 1 MG PO TABS
1.0000 mg | ORAL_TABLET | Freq: Once | ORAL | Status: AC
  Administered 2012-06-21: 1 mg via ORAL

## 2012-06-21 MED ORDER — SODIUM CHLORIDE 0.9 % IJ SOLN
INTRAMUSCULAR | Status: AC
  Administered 2012-06-21: 3 mL
  Filled 2012-06-21: qty 3

## 2012-06-21 MED ORDER — PIMOZIDE 2 MG PO TABS: 3.0000 mg | ORAL_TABLET | ORAL | Status: DC

## 2012-06-21 MED ORDER — ACETAMINOPHEN 650 MG RE SUPP: 650.0000 mg | Freq: Four times a day (QID) | RECTAL | Status: DC | PRN

## 2012-06-21 NOTE — Progress Notes (Signed)
RN to ED to attempt to obtain Telepsych computer. ED RN stated that telepsych computer was in use at this time. Called ED RN 30-45 mins later, ED RN stated telepsych computer was in a patient's room at this time. Will report the need of telepsych consult to night shift RN.

## 2012-06-21 NOTE — Progress Notes (Signed)
Pt belongings bag obtained from ED, placed in med prep room on unit 300.

## 2012-06-21 NOTE — Progress Notes (Signed)
Pt's HR increased to 164 while standing for orthostatic vital signs. Unable to obtain standing blood pressure. MD aware as Cardiazem 180 mg PO scheduled ordered.

## 2012-06-21 NOTE — ED Notes (Addendum)
While medicating pt for anxiety, pt reassured that she will be okay - that her eyes will not be "stuck forever".  Told pt that we even contacted Poison Control, and was informed by them that she was in no danger with taking 2 Celexa.  Pt then states, "Well, what if I took more than that?".  Pt is now reporting that she took 5 Celexa 40mg  tablets; states, "not all at one time, but throughout the day yesterday".  When asked the reason for taking too many Celexa, states, "because I was nervous".  Pt states she did not report this to staff earlier because she did not want to go to [facility]. Poison Control contacted with update.  Advised 24 hour observation with continuous cardiac monitoring, obtain EKG, and prn Phenobarbital or benzodiazepenes. Dr. Adriana Simas made aware.

## 2012-06-21 NOTE — ED Provider Notes (Signed)
History   This chart was scribed for Donnetta Hutching, MD by Gerlean Ren. This patient was seen in room [facility] and the patient's care was started at 7:58AM.   CSN: [id number]  Arrival date & time 06/21/12  0620   First MD Initiated Contact with Patient 06/21/12 0725      Chief Complaint  Patient presents with  . Eye Problem    (Consider location/radiation/quality/duration/timing/severity/associated sxs/prior treatment) The history is provided by the patient. No language interpreter was used.   Kayla [name] is a 41 y.o. female brought in by ambulance, who presents to the Emergency Department complaining of 12 hours of bilaterally dilated pupils.  Pt is prescribed 1 Cilexa/day but reports taking 2 yesterday at 11AM and 6PM.  Pt denies any visual disturbances as associated symptoms.  Pt has h/o anxiety, delusional disorder, and somatization disorder.  Pt denies tobacco and alcohol use.  Past Medical History  Diagnosis Date  . Ovarian cyst     cervical polyp  . Anxiety     Panic attacks; somatization  . Palpitations     possible bradycardia  . Delusional disorder   . Somatization disorder     Past Surgical History  Procedure Date  . None 05-22-12    hx of ovarian cysts    Family History  Problem Relation Age of Onset  . Cancer Father   . Heart failure Other   . Hypertension Mother   . Diabetes Mother     History  Substance Use Topics  . Smoking status: Never Smoker   . Smokeless tobacco: Never Used  . Alcohol Use: No    OB History    Grav Para Term Preterm Abortions TAB SAB Ect Mult Living   3 3 3       3       Review of Systems  All other systems reviewed and are negative.    Allergies  Latex  Home Medications   Current Outpatient Rx  Name Route Sig Dispense Refill  . CITALOPRAM HYDROBROMIDE 40 MG PO TABS Oral Take 1 tablet (40 mg total) by mouth daily. For depression and anxiety. 30 tablet 0  . DILTIAZEM HCL ER COATED BEADS 180 MG PO CP24 Oral Take  1 capsule (180 mg total) by mouth daily. For blood pressure. 30 capsule 0  . HALOPERIDOL 2 MG PO TABS Oral Take 2 mg by mouth 2 (two) times daily.    Marland Kitchen MIRTAZAPINE 15 MG PO TABS Oral Take 1 tablet (15 mg total) by mouth at bedtime. For sleep and appetite. 30 tablet 0  . PIMOZIDE 1 MG PO TABS Oral Take 3 tablets (3 mg total) by mouth 2 (two) times daily in the am and at bedtime.. For somatic delusions. 60 tablet 0  . POTASSIUM CHLORIDE ER 10 MEQ PO TBCR Oral Take 10 mEq by mouth 2 (two) times daily.    Marland Kitchen RISPERIDONE 4 MG PO TBDP Oral Take 1 tablet (4 mg total) by mouth at bedtime. For psychosis. 30 tablet 0    BP 146/87  Pulse 125  Temp 99 F (37.2 C) (Oral)  Resp 20  Ht 5\' 6"  (1.676 m)  Wt 110 lb (49.896 kg)  BMI 17.75 kg/m2  SpO2 95%  LMP 06/21/2012  Physical Exam  Nursing note and vitals reviewed. Constitutional: She is oriented to person, place, and time. She appears well-developed and well-nourished.       Agitated.  Flight of ideas.  HENT:  Head: Normocephalic and atraumatic.  Eyes:  Conjunctivae and EOM are normal. Pupils are equal, round, and reactive to light.       Pupils were dilated bilaterally.  Neck: Normal range of motion. Neck supple.  Cardiovascular: Normal rate, regular rhythm and normal heart sounds.   Pulmonary/Chest: Effort normal and breath sounds normal.  Abdominal: Soft. Bowel sounds are normal.  Musculoskeletal: Normal range of motion.  Neurological: She is alert and oriented to person, place, and time.  Skin: Skin is warm and dry.  Psychiatric: She has a normal mood and affect.    ED Course  Procedures (including critical care time) DIAGNOSTIC STUDIES: Oxygen Saturation is 95% on room air, adequate by my interpretation.    COORDINATION OF CARE: 7:59AM- Informed pt that eyes appear normal.  Ordered meds for anxiety.   Labs Reviewed  BASIC METABOLIC PANEL - Abnormal; Notable for the following:    Sodium 133 (*)     Glucose, Bld 168 (*)     All  other components within normal limits  URINALYSIS, ROUTINE W REFLEX MICROSCOPIC - Abnormal; Notable for the following:    Hgb urine dipstick LARGE (*)     Bilirubin Urine MODERATE (*)     Ketones, ur >80 (*)     Protein, ur TRACE (*)     Leukocytes, UA SMALL (*)     All other components within normal limits  SALICYLATE LEVEL - Abnormal; Notable for the following:    Salicylate Lvl <2.0 (*)     All other components within normal limits  URINE MICROSCOPIC-ADD ON - Abnormal; Notable for the following:    Squamous Epithelial / LPF FEW (*)     Bacteria, UA FEW (*)     All other components within normal limits  CBC  URINE RAPID DRUG SCREEN (HOSP PERFORMED)  PREGNANCY, URINE  ETHANOL  ACETAMINOPHEN LEVEL   No results found. Results for orders placed during the hospital encounter of 06/21/12  CBC      Component Value Range   WBC 10.3  4.0 - 10.5 K/uL   RBC 4.91  3.87 - 5.11 MIL/uL   Hemoglobin 13.3  12.0 - 15.0 g/dL   HCT 13.2  44.0 - 10.2 %   MCV 78.4  78.0 - 100.0 fL   MCH 27.1  26.0 - 34.0 pg   MCHC 34.5  30.0 - 36.0 g/dL   RDW 72.5  36.6 - 44.0 %   Platelets 282  150 - 400 K/uL  BASIC METABOLIC PANEL      Component Value Range   Sodium 133 (*) 135 - 145 mEq/L   Potassium 3.5  3.5 - 5.1 mEq/L   Chloride 97  96 - 112 mEq/L   CO2 21  19 - 32 mEq/L   Glucose, Bld 168 (*) 70 - 99 mg/dL   BUN 8  6 - 23 mg/dL   Creatinine, Ser 3.47  0.50 - 1.10 mg/dL   Calcium 42.5  8.4 - 95.6 mg/dL   GFR calc non Af Amer >90  >90 mL/min   GFR calc Af Amer >90  >90 mL/min  URINE RAPID DRUG SCREEN (HOSP PERFORMED)      Component Value Range   Opiates NONE DETECTED  NONE DETECTED   Cocaine NONE DETECTED  NONE DETECTED   Benzodiazepines NONE DETECTED  NONE DETECTED   Amphetamines NONE DETECTED  NONE DETECTED   Tetrahydrocannabinol NONE DETECTED  NONE DETECTED   Barbiturates NONE DETECTED  NONE DETECTED  URINALYSIS, ROUTINE W REFLEX MICROSCOPIC  Component Value Range   Color, Urine YELLOW   YELLOW   APPearance CLEAR  CLEAR   Specific Gravity, Urine 1.030  1.005 - 1.030   pH 6.0  5.0 - 8.0   Glucose, UA NEGATIVE  NEGATIVE mg/dL   Hgb urine dipstick LARGE (*) NEGATIVE   Bilirubin Urine MODERATE (*) NEGATIVE   Ketones, ur >80 (*) NEGATIVE mg/dL   Protein, ur TRACE (*) NEGATIVE mg/dL   Urobilinogen, UA 1.0  0.0 - 1.0 mg/dL   Nitrite NEGATIVE  NEGATIVE   Leukocytes, UA SMALL (*) NEGATIVE  PREGNANCY, URINE      Component Value Range   Preg Test, Ur NEGATIVE  NEGATIVE  ETHANOL      Component Value Range   Alcohol, Ethyl (B) <11  0 - 11 mg/dL  SALICYLATE LEVEL      Component Value Range   Salicylate Lvl <2.0 (*) 2.8 - 20.0 mg/dL  ACETAMINOPHEN LEVEL      Component Value Range   Acetaminophen (Tylenol), Serum <15.0  10 - 30 ug/mL  URINE MICROSCOPIC-ADD ON      Component Value Range   Squamous Epithelial / LPF FEW (*) RARE   WBC, UA 7-10  <3 WBC/hpf   RBC / HPF 7-10  <3 RBC/hpf   Bacteria, UA FEW (*) RARE   No results found.  No results found.   No diagnosis found.  Date: 06/21/2012  Rate: 97  Rhythm: normal sinus rhythm  QRS Axis: normal  Intervals: QT prolonged  ST/T Wave abnormalities: normal  Conduction Disutrbances:none  Narrative Interpretation:   Old EKG Reviewed: changes notedCRITICAL CARE Performed by: Donnetta Hutching   Total critical care time: 30  Critical care time was exclusive of separately billable procedures and treating other patients.  Critical care was necessary to treat or prevent imminent or life-threatening deterioration.  Critical care was time spent personally by me on the following activities: development of treatment plan with patient and/or surrogate as well as nursing, discussions with consultants, evaluation of patient's response to treatment, examination of patient, obtaining history from patient or surrogate, ordering and performing treatments and interventions, ordering and review of laboratory studies, ordering and review of  radiographic studies, pulse oximetry and re-evaluation of patient's condition.   MDM  Suspect a Seroquel overdose.  Pupils are dilated. Discussed with poison control. Recommend a 24-hour monitoring. EKG shows QT prolongation.        Donnetta Hutching, MD 06/21/12 1323

## 2012-06-21 NOTE — ED Notes (Signed)
Pt appears anxious; pt is cooperative.  Pt is concerned that her eyes are stuck, that they will not move side to side or up and down.

## 2012-06-21 NOTE — ED Notes (Signed)
Refused lunch

## 2012-06-21 NOTE — ED Notes (Signed)
Pt c/o eyes being dilated

## 2012-06-21 NOTE — H&P (Signed)
[facility] History and Physical  Kayla [name] ZOX:[id number] DOB: [birth year] DOA: 06/21/2012  Referring physician: Dr. Donnetta Hutching PCP: Fredirick Maudlin, MD   Chief Complaint: "Because my eyes got fixated and I got scared."   History of Present Illness: Kayla [name] is an 41 y.o. female with a PMH of somatic delusional disorder and recent hospitalization at [facility] who presented to the ER with a chief complaint of feeling unable to move her eyes.  The patient states she took an unspecified amount of Seroquel yesterday.  She denies she was feeling suicidal, simply forgot that she had taken her dose and kept taking more.  The patient states that her symptoms began at around 11:00 pm last night, and that her muscles feel stiff.  She also had a pulsating feeling behind her eyes, and is extremely anxious that she permanently damaged her eyes.  She has poor concentration, and keeps repeating the same questions and concerns about her eyes.  She thinks her eye issues may be related to taking potassium.  Because a 12 lead EKG done in the ER showed prolongation of the QT interval, poison control recommends admitting the patient for observation and cardiac monitoring.  She denies suicidal ideation or intent to harm herself.  Review of Systems: Constitutional: No fever, no chills;  Appetite normal; + unintentional weight loss, no weight gain.  HEENT: No blurry vision, no diplopia, + pharyngitis x 10 months, no dysphagia CV: No chest pain, no palpitations.  Resp: No SOB, no cough. GI: No nausea, no vomiting, no diarrhea, no melena, no hematochezia.  GU: No dysuria, no hematuria.  MSK: no myalgias, no arthralgias.  Neuro:  No headache, no focal neurological deficits, no history of seizures.  Psych: + depression and anxiety.  Endo: No thyroid disease, no DM, no heat intolerance, no cold intolerance, no polyuria, no polydipsia  Skin: No rashes, no skin lesions.  Heme: No easy bruising, no history of blood  diseases.  Past Medical History Past Medical History  Diagnosis Date  . Ovarian cyst     cervical polyp  . Anxiety     Panic attacks; somatization  . Palpitations     possible bradycardia  . Delusional disorder   . Somatization disorder      Past Surgical History Past Surgical History  Procedure Date  . None 05-22-12    hx of ovarian cysts     Social History: History   Social History  . Marital Status: Divorced    Spouse Name: N/A    Number of Children: N/A  . Years of Education: N/A   Occupational History  . Unemployed    Social History Main Topics  . Smoking status: Never Smoker   . Smokeless tobacco: Never Used  . Alcohol Use: No  . Drug Use: No  . Sexually Active: No   Other Topics Concern  . Not on file   Social History Narrative   Divorced.  Lives with her mother and 3 children.      Family History:  Family History  Problem Relation Age of Onset  . Cancer Father   . Heart failure Other   . Hypertension Mother   . Diabetes Mother     Allergies: Latex  Meds: Prior to Admission medications   Medication Sig Start Date End Date Taking? Authorizing Provider  citalopram (CELEXA) 40 MG tablet Take 1 tablet (40 mg total) by mouth daily. For depression and anxiety. 05/28/12 05/28/13 Yes Ronny Bacon, MD  diltiazem (  CARDIZEM CD) 180 MG 24 hr capsule Take 1 capsule (180 mg total) by mouth daily. For blood pressure. 05/28/12 05/28/13 Yes Curlene Labrum Readling, MD  haloperidol (HALDOL) 2 MG tablet Take 2 mg by mouth 2 (two) times daily.   Yes Historical Provider, MD  mirtazapine (REMERON) 15 MG tablet Take 1 tablet (15 mg total) by mouth at bedtime. For sleep and appetite. 05/28/12 06/27/12 Yes Curlene Labrum Readling, MD  pimozide 1 MG TABS Take 3 tablets (3 mg total) by mouth 2 (two) times daily in the am and at bedtime.. For somatic delusions. 05/28/12 05/28/13 Yes Curlene Labrum Readling, MD  potassium chloride (K-DUR) 10 MEQ tablet Take 10 mEq by mouth 2 (two) times daily.    Yes Historical Provider, MD  risperiDONE (RISPERDAL M-TABS) 4 MG disintegrating tablet Take 1 tablet (4 mg total) by mouth at bedtime. For psychosis. 05/28/12 06/27/12 Yes Ronny Bacon, MD    Physical Exam: Filed Vitals:   06/21/12 1610 06/21/12 1011  BP: 146/87 138/81  Pulse: 125 105  Temp: 99 F (37.2 C)   TempSrc: Oral   Resp: 20 18  Height: 5\' 6"  (1.676 m)   Weight: 49.896 kg (110 lb)   SpO2: 95% 98%     Physical Exam: Blood pressure 138/81, pulse 105, temperature 99 F (37.2 C), temperature source Oral, resp. rate 18, height 5\' 6"  (1.676 m), weight 49.896 kg (110 lb), last menstrual period 06/21/2012, SpO2 98.00%. BP 138/81  Pulse 105  Temp 99 F (37.2 C) (Oral)  Resp 18  Ht 5\' 6"  (1.676 m)  Wt 49.896 kg (110 lb)  BMI 17.75 kg/m2  SpO2 98%  LMP 06/21/2012  General Appearance:    Alert, anxious, appears stated age  Head:    Normocephalic, without obvious abnormality, atraumatic  Eyes:    PERRL with dilated pupils, conjunctiva/corneas clear, EOM's intact  Ears:    Normal external ear canals, both ears  Nose:   Nares normal, septum midline, mucosa normal, no drainage    or sinus tenderness  Throat:   Lips, mucosa, and tongue normal; teeth and gums normal  Neck:   Supple, symmetrical, trachea midline, no adenopathy;    thyroid:  no enlargement/tenderness/nodules; no carotid   bruit or JVD  Lungs:     Clear to auscultation bilaterally, respirations unlabored   Heart:    Regular rate and rhythm, S1 and S2 normal, no murmur, rub   or gallop  Abdomen:     Soft, non-tender, bowel sounds active all four quadrants,    no masses, no organomegaly  Extremities:   Extremities normal, atraumatic, no cyanosis or edema  Pulses:   2+ and symmetric all extremities  Skin:   Skin color, texture, turgor normal, no rashes or lesions  Lymph nodes:   Cervical, supraclavicular, and axillary nodes normal  Neurologic/Psych:   CNII-XII intact, normal strength, affect depressed with  significant anxiety noted.    Labs on Admission:  Basic Metabolic Panel:  Lab 06/21/12 9604  NA 133*  K 3.5  CL 97  CO2 21  GLUCOSE 168*  BUN 8  CREATININE 0.52  CALCIUM 10.0  MG --  PHOS --   CBC:  Lab 06/21/12 0647  WBC 10.3  NEUTROABS --  HGB 13.3  HCT 38.5  MCV 78.4  PLT 282   Radiological Exams on Admission: No results found.  EKG: Independently reviewed. Normal sinus rhythm; Prolonged QT; Abnormal ECG When compared with ECG of 19-Mar-2012 05:54, Nonspecific T wave abnormality now  evident in Lateral leads  Assessment/Plan Principal Problem:  *Overdose of antipsychotic with dilated pupils and possible dystonia affecting eye muscles  Admit for 23 hour observation, to tele bed given prolonged QT interval.  Start empiric cogentin to address any dystonia.  Psychiatric consultation requested.  Suicide precautions until cleared by psychiatry, since the patient overdosed on her medications. Active Problems:  Delusional disorder  Hold anti-psychotics due to prolonged QT interval.  Psychiatry consulted.  Hyperglycemia  Not a fasting sample.  Check BMET in a.m. Fasting sample.  Hyponatremia  May be mild SIADH related to medications versus psychogenic polydipsia.  Monitor.  QT prolongation with abnormal EKG  Hold antipsychotics and monitor on telemetry.   Code Status: Full Family Communication: Gaylyn Lambert (mother) [phone] Disposition Plan: Home versus inpatient psychiatric hospitalization depending on progress, psych evaluation.  Time spent: 1 hour.  Krysten Veronica [facility] Pager [phone]  If 7PM-7AM, please contact night-coverage [email or web address] Password [email or web address] 06/21/2012, 1:46 PM

## 2012-06-21 NOTE — ED Notes (Signed)
Reports to Dr. Adriana Simas that she took Celexa 40mg  tab x 2 yesterday; one at 1100 and one at 1800.  Poison control called as requested by Dr. Adriana Simas.  Mary at [employer] advises nothing different concerning treatment.

## 2012-06-22 LAB — BASIC METABOLIC PANEL WITH GFR
BUN: 11 mg/dL (ref 6–23)
CO2: 22 meq/L (ref 19–32)
Calcium: 9.1 mg/dL (ref 8.4–10.5)
Chloride: 104 meq/L (ref 96–112)
Creatinine, Ser: 0.59 mg/dL (ref 0.50–1.10)
GFR calc Af Amer: >90 mL/min
GFR calc non Af Amer: >90 mL/min
Glucose, Bld: 99 mg/dL (ref 70–99)
Potassium: 3.6 meq/L (ref 3.5–5.1)
Sodium: 139 meq/L (ref 135–145)

## 2012-06-22 MED ORDER — MIRTAZAPINE 30 MG PO TABS
15.0000 mg | ORAL_TABLET | Freq: Every day | ORAL | Status: DC
  Administered 2012-06-22: 15 mg via ORAL
  Filled 2012-06-22: qty 1

## 2012-06-22 MED ORDER — ENSURE COMPLETE PO LIQD
237.0000 mL | Freq: Two times a day (BID) | ORAL | Status: DC
  Administered 2012-06-22 – 2012-06-23 (×2): 237 mL via ORAL

## 2012-06-22 MED ORDER — RISPERIDONE 1 MG PO TBDP
4.0000 mg | ORAL_TABLET | Freq: Every day | ORAL | Status: DC
  Administered 2012-06-22: 4 mg via ORAL
  Filled 2012-06-22: qty 2
  Filled 2012-06-22 (×2): qty 4

## 2012-06-22 NOTE — Progress Notes (Signed)
Subjective: She was admitted by the hospitalist yesterday because of confusion about who is admitting patients for me. She has apparently taken too much of her Seroquel. She says that her eyes do not move but they do. She says she feels like her pupils are 2 dilated and they are only mildly dilated. She says she needs an ophthalmologist to come see her. I think this is a manifestation of her somatization of her mental illness. I don't think we need ophthalmology consult right now.  Objective: Vital signs in last 24 hours: Temp:  [98 F (36.7 C)-99 F (37.2 C)] 99 F (37.2 C) (09/09 1558) Pulse Rate:  [101-164] 126  (09/10 0651) Resp:  [16-18] 18  (09/10 0651) BP: (116-138)/(68-82) 117/80 mmHg (09/10 0651) SpO2:  [96 %-98 %] 97 % (09/10 0651) Weight:  [49.896 kg (110 lb)] 49.896 kg (110 lb) (09/09 1551) Weight change: 0 kg (0 lb) Last BM Date: 06/19/12  Intake/Output from previous day: 09/09 0701 - 09/10 0700 In: 101.3 [I.V.:101.3] Out: -   PHYSICAL EXAM General appearance: alert, mild distress and obsessing about her eyes Resp: clear to auscultation bilaterally Cardio: regular rate and rhythm, S1, S2 normal, no murmur, click, rub or gallop GI: soft, non-tender; bowel sounds normal; no masses,  no organomegaly Extremities: extremities normal, atraumatic, no cyanosis or edema  Lab Results:    Basic Metabolic Panel:  Basename 06/22/12 0530 06/21/12 0647  NA 139 133*  K 3.6 3.5  CL 104 97  CO2 22 21  GLUCOSE 99 168*  BUN 11 8  CREATININE 0.59 0.52  CALCIUM 9.1 10.0  MG -- --  PHOS -- --   Liver Function Tests: No results found for this basename: AST:2,ALT:2,ALKPHOS:2,BILITOT:2,PROT:2,ALBUMIN:2 in the last 72 hours No results found for this basename: LIPASE:2,AMYLASE:2 in the last 72 hours No results found for this basename: AMMONIA:2 in the last 72 hours CBC:  Basename 06/21/12 0647  WBC 10.3  NEUTROABS --  HGB 13.3  HCT 38.5  MCV 78.4  PLT 282   Cardiac  Enzymes: No results found for this basename: CKTOTAL:3,CKMB:3,CKMBINDEX:3,TROPONINI:3 in the last 72 hours BNP: No results found for this basename: PROBNP:3 in the last 72 hours D-Dimer: No results found for this basename: DDIMER:2 in the last 72 hours CBG: No results found for this basename: GLUCAP:6 in the last 72 hours Hemoglobin A1C: No results found for this basename: HGBA1C in the last 72 hours Fasting Lipid Panel: No results found for this basename: CHOL,HDL,LDLCALC,TRIG,CHOLHDL,LDLDIRECT in the last 72 hours Thyroid Function Tests: No results found for this basename: TSH,T4TOTAL,FREET4,T3FREE,THYROIDAB in the last 72 hours Anemia Panel: No results found for this basename: VITAMINB12,FOLATE,FERRITIN,TIBC,IRON,RETICCTPCT in the last 72 hours Coagulation: No results found for this basename: LABPROT:2,INR:2 in the last 72 hours Urine Drug Screen: Drugs of Abuse     Component Value Date/Time   LABOPIA NONE DETECTED 06/21/2012 0643   COCAINSCRNUR NONE DETECTED 06/21/2012 0643   LABBENZ NONE DETECTED 06/21/2012 0643   AMPHETMU NONE DETECTED 06/21/2012 0643   THCU NONE DETECTED 06/21/2012 0643   LABBARB NONE DETECTED 06/21/2012 0643    Alcohol Level:  Basename 06/21/12 0647  ETH <11   Urinalysis:  Basename 06/21/12 0643  COLORURINE YELLOW  LABSPEC 1.030  PHURINE 6.0  GLUCOSEU NEGATIVE  HGBUR LARGE*  BILIRUBINUR MODERATE*  KETONESUR >80*  PROTEINUR TRACE*  UROBILINOGEN 1.0  NITRITE NEGATIVE  LEUKOCYTESUR SMALL*   Misc. Labs:  ABGS No results found for this basename: PHART,PCO2,PO2ART,TCO2,HCO3 in the last 72 hours CULTURES  No results found for this or any previous visit (from the past 240 hour(s)). Studies/Results: No results found.  Medications:  Prior to Admission:  Prescriptions prior to admission  Medication Sig Dispense Refill  . citalopram (CELEXA) 40 MG tablet Take 1 tablet (40 mg total) by mouth daily. For depression and anxiety.  30 tablet  0  . diltiazem  (CARDIZEM CD) 180 MG 24 hr capsule Take 1 capsule (180 mg total) by mouth daily. For blood pressure.  30 capsule  0  . haloperidol (HALDOL) 2 MG tablet Take 2 mg by mouth 2 (two) times daily.      . mirtazapine (REMERON) 15 MG tablet Take 1 tablet (15 mg total) by mouth at bedtime. For sleep and appetite.  30 tablet  0  . pimozide 1 MG TABS Take 3 tablets (3 mg total) by mouth 2 (two) times daily in the am and at bedtime.. For somatic delusions.  60 tablet  0  . potassium chloride (K-DUR) 10 MEQ tablet Take 10 mEq by mouth 2 (two) times daily.      . risperiDONE (RISPERDAL M-TABS) 4 MG disintegrating tablet Take 1 tablet (4 mg total) by mouth at bedtime. For psychosis.  30 tablet  0   Scheduled:   . benztropine  1 mg Oral BID  . citalopram  40 mg Oral Daily  . diltiazem  180 mg Oral Daily  . enoxaparin (LOVENOX) injection  40 mg Subcutaneous Q24H  . potassium chloride  10 mEq Oral BID  . sodium chloride  3 mL Intravenous Q12H  . sodium chloride  3 mL Intravenous Q12H  . sodium chloride      . DISCONTD: pimozide  3 mg Oral BH-qamhs   Continuous:   . 0.9 % NaCl with KCl 20 mEq / L 75 mL/hr at 06/22/12 4782   NFA:OZHYQMVHQIONG, acetaminophen, alum & mag hydroxide-simeth, ondansetron (ZOFRAN) IV, ondansetron, polyethylene glycol  Assesment: She had an overdose of her antipsychotic. She is able to move her eyes now in all directions although she says it is not normal she still has some minimal pupillary dilatation. She has pretty severe psychiatric illness with somatization of her problems. Prior to the time that I could get her to a psychiatrist she had innumerable office visits each with different complaints but I think were related to her psychiatric illness Principal Problem:  *Overdose of antipsychotic with dilated pupils and possible dystonia affecting eye muscles Active Problems:  Delusional disorder  Hyperglycemia  Hyponatremia  QT prolongation with abnormal EKG    Plan:  Continue observation and I will ask for psychiatric consultation    LOS: 1 day   Shavona Gunderman L 06/22/2012, 8:34 AM

## 2012-06-22 NOTE — BH Assessment (Signed)
Assessment Note   Kayla [name] is an 41 y.o. female. Patient presented through the emergency room on yesterday complaining of over taking her medication. Which lead to her complaints of her eyes being dialated, increased anxiety and fear that she has premanately damaged her eyes. Spoke with patient today on medical floor where she continued to state over and over again that she needed to see an eye doctor. Patient denies suicidality and homicidality. When asked  how many of her medications did she take she stated that she did not know.  When asked why she over took her medications she stated that she got confused about her medications. Patient stated that her mother whom she lived with could not help her with her medications because she was blind. She stated that her son Kayla [name] helped her with her medications and she gave permission for me to call him but the number she gave me did not work. Also attempted to call her mother but there was no answer. Patient does not meet criteria for inpatient treatment at this time.  Axis I: Delusional Disorder Axis II: Deferred Axis III:  Past Medical History  Diagnosis Date  . Ovarian cyst     cervical polyp  . Anxiety     Panic attacks; somatization  . Palpitations     possible bradycardia  . Delusional disorder   . Somatization disorder    Axis IV: other psychosocial or environmental problems and problems related to social environment Axis V: 51-60 moderate symptoms  Past Medical History:  Past Medical History  Diagnosis Date  . Ovarian cyst     cervical polyp  . Anxiety     Panic attacks; somatization  . Palpitations     possible bradycardia  . Delusional disorder   . Somatization disorder     Past Surgical History  Procedure Date  . None 05-22-12    hx of ovarian cysts    Family History:  Family History  Problem Relation Age of Onset  . Cancer Father   . Heart failure Other   . Hypertension Mother   . Diabetes Mother      Social History:  reports that she has never smoked. She has never used smokeless tobacco. She reports that she does not drink alcohol or use illicit drugs.  Additional Social History:  Alcohol / Drug Use History of alcohol / drug use?: No history of alcohol / drug abuse  CIWA: CIWA-Ar BP: 117/80 mmHg Pulse Rate: 126  COWS:    Allergies:  Allergies  Allergen Reactions  . Latex Itching    Home Medications:  Medications Prior to Admission  Medication Sig Dispense Refill  . citalopram (CELEXA) 40 MG tablet Take 1 tablet (40 mg total) by mouth daily. For depression and anxiety.  30 tablet  0  . diltiazem (CARDIZEM CD) 180 MG 24 hr capsule Take 1 capsule (180 mg total) by mouth daily. For blood pressure.  30 capsule  0  . haloperidol (HALDOL) 2 MG tablet Take 2 mg by mouth 2 (two) times daily.      . mirtazapine (REMERON) 15 MG tablet Take 1 tablet (15 mg total) by mouth at bedtime. For sleep and appetite.  30 tablet  0  . pimozide 1 MG TABS Take 3 tablets (3 mg total) by mouth 2 (two) times daily in the am and at bedtime.. For somatic delusions.  60 tablet  0  . potassium chloride (K-DUR) 10 MEQ tablet Take 10 mEq by mouth 2 (  two) times daily.      . risperiDONE (RISPERDAL M-TABS) 4 MG disintegrating tablet Take 1 tablet (4 mg total) by mouth at bedtime. For psychosis.  30 tablet  0    OB/GYN Status:  Patient's last menstrual period was 06/21/2012.  General Assessment Data Location of Assessment: [facility] ACT Assessment: Yes Living Arrangements: Parent (Mother) Can pt return to current living arrangement?: Yes Admission Status: Voluntary Is patient capable of signing voluntary admission?: Yes Transfer from: Home Referral Source: Medical Floor Inpatient  Education Status Is patient currently in school?: No Contact person:  (Mother)  Risk to self Suicidal Ideation: No Suicidal Intent: No Is patient at risk for suicide?: No Suicidal Plan?: No Access to Means: No What has  been your use of drugs/alcohol within the last 12 months?:  (Denies) Previous Attempts/Gestures: No How many times?:  (None) Other Self Harm Risks:  (None) Triggers for Past Attempts: None known Intentional Self Injurious Behavior: None Family Suicide History: No Recent stressful life event(s):  (None identified) Persecutory voices/beliefs?: No Depression: No Depression Symptoms:  (None identified) Substance abuse history and/or treatment for substance abuse?: No Suicide prevention information given to non-admitted patients: Not applicable  Risk to Others Homicidal Ideation: No Thoughts of Harm to Others: No Current Homicidal Intent: No Current Homicidal Plan: No Access to Homicidal Means: No Identified Victim:  (None) History of harm to others?: No Assessment of Violence: None Noted Violent Behavior Description:  (Na) Does patient have access to weapons?: No Criminal Charges Pending?: No Does patient have a court date: No  Psychosis Hallucinations: None noted Delusions: Somatic  Mental Status Report Appear/Hygiene: Disheveled Eye Contact: Good Motor Activity: Agitation;Restlessness (Anxiety) Speech: Pressured;Tangential Level of Consciousness: Alert;Restless Mood: Anxious;Fearful;Preoccupied Affect: Anxious Anxiety Level: Severe Thought Processes: Circumstantial Judgement: Unimpaired Orientation: Person;Place;Time;Situation Obsessive Compulsive Thoughts/Behaviors: Severe  Cognitive Functioning Concentration: Decreased Memory: Recent Intact;Remote Intact IQ: Average Insight: Poor Impulse Control: Fair Appetite: Fair Weight Loss:  (Decreased) Weight Gain:  (None) Sleep: Decreased Total Hours of Sleep:  (none in past 2 nights d/t anxiety over eyes) Vegetative Symptoms: None  ADLScreening [facility] Assessment Services) Patient's cognitive ability adequate to safely complete daily activities?: Yes Patient able to express need for assistance with ADLs?:  Yes Independently performs ADLs?: Yes (appropriate for developmental age)  Abuse/Neglect [facility]) Physical Abuse: Denies Verbal Abuse: Denies Sexual Abuse: Denies  Prior Inpatient Therapy Prior Inpatient Therapy: Yes Prior Therapy Dates:  (05/2012/ 03/2012) Prior Therapy Facilty/Provider(s):  [facility]) Reason for Treatment:  (Delusional DO)  Prior Outpatient Therapy Prior Outpatient Therapy: Yes Prior Therapy Dates:  (Current) Prior Therapy Facilty/Provider(s):  ([facility]) Reason for Treatment:  (Med management)  ADL Screening (condition at time of admission) Patient's cognitive ability adequate to safely complete daily activities?: Yes Patient able to express need for assistance with ADLs?: Yes Independently performs ADLs?: Yes (appropriate for developmental age) Weakness of Legs: None Weakness of Arms/Hands: None  Home Assistive Devices/Equipment Home Assistive Devices/Equipment: None  Therapy Consults (therapy consults require a physician order) PT Evaluation Needed: No OT Evalulation Needed: No SLP Evaluation Needed: No Abuse/Neglect Assessment (Assessment to be complete while patient is alone) Physical Abuse: Denies Verbal Abuse: Denies Sexual Abuse: Denies Exploitation of patient/patient's resources: Denies Self-Neglect: Denies Values / Beliefs Cultural Requests During Hospitalization: None Spiritual Requests During Hospitalization: Other (comment) (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, community Spiritual Care Consult Needed: No Social Work Consult Needed: No Merchant navy officer (For Healthcare) Advance Directive: Patient does not have advance directive Pre-existing out of facility DNR order (  yellow form or pink MOST form): No Nutrition Screen- [facility] Patient's home diet: Regular Have you recently lost weight without trying?: Yes If yes, how much weight have you lost?: 34 lb or more (pt states she has lost 150 lbs since christmas) Have you been eating poorly because of  a decreased appetite?: Yes Malnutrition Screening Tool Score: 5   Additional Information 1:1 In Past 12 Months?: No CIRT Risk: No Elopement Risk: No Does patient have medical clearance?: Yes     Disposition:  Disposition Disposition of Patient: Outpatient treatment Type of outpatient treatment: Adult (Follow up with current provider)  On Site Evaluation by:   Reviewed with Physician:     Rudi Coco 06/22/2012 1:53 PM

## 2012-06-22 NOTE — Progress Notes (Signed)
UR Chart Review Completed  

## 2012-06-22 NOTE — Progress Notes (Signed)
Now making whining noises. States I "can't cry"  Upset she is unable to make tears. Pt not crying she is just making whining noises. Still obsessed with her pupils being dilated. Mildly dilated if at 4 mm

## 2012-06-22 NOTE — Progress Notes (Signed)
Pt remains obsessed with pupil dilation. Although pupils are mildly dilated they react normally when a light is directed into eyes. Attempted to explain her eyes are fine. No attempts at explanation seem to satisfy her.

## 2012-06-22 NOTE — Progress Notes (Signed)
INITIAL ADULT NUTRITION ASSESSMENT Date: 06/22/2012   Time: 1:52 PM Reason for Assessment: Nutrition risk   ASSESSMENT: Female 41 y.o.  Dx: Overdose of antipsychotic   INTERVENTION: 1. Will order patient Ensure BID, provides 500 kcal and 18 grams of protein daily.  2. RD to follow for nutrition plan of care.  Hx:  Past Medical History  Diagnosis Date  . Ovarian cyst     cervical polyp  . Anxiety     Panic attacks; somatization  . Palpitations     possible bradycardia  . Delusional disorder   . Somatization disorder     Related Meds:  Scheduled Meds:   . benztropine  1 mg Oral BID  . citalopram  40 mg Oral Daily  . diltiazem  180 mg Oral Daily  . enoxaparin (LOVENOX) injection  40 mg Subcutaneous Q24H  . potassium chloride  10 mEq Oral BID  . sodium chloride  3 mL Intravenous Q12H  . sodium chloride  3 mL Intravenous Q12H  . sodium chloride      . DISCONTD: pimozide  3 mg Oral BH-qamhs   Continuous Infusions:   . 0.9 % NaCl with KCl 20 mEq / L 75 mL/hr at 06/22/12 0632   PRN Meds:.acetaminophen, acetaminophen, alum & mag hydroxide-simeth, ondansetron (ZOFRAN) IV, ondansetron, polyethylene glycol   Ht: 5\' 4"  (162.6 cm)  Wt: 110 lb (49.896 kg)  Ideal Wt: 54.5 kg % Ideal Wt: 91.6% Wt Readings from Last 10 Encounters:  06/21/12 110 lb (49.896 kg)  05/23/12 133 lb (60.328 kg)  05/19/12 130 lb (58.968 kg)  05/19/12 140 lb (63.504 kg)  03/23/12 152 lb (68.947 kg)  03/23/12 152 lb (68.947 kg)  01/30/12 180 lb (81.647 kg)  01/11/12 178 lb (80.74 kg)  12/27/11 176 lb 12.9 oz (80.2 kg)  12/20/11 181 lb (82.101 kg)  *Weight has continued to trend down. Weight down 70 lb over 6 months, 38.8% from baseline.    Body mass index is 18.88 kg/(m^2). (WNL)  Food/Nutrition Related Hx: Patient familiar to me from [facility]. Patient continues to loose weight. Patient with altered mental status, unable to obtain information. Patient's RN tech reported she ate well at lunch meal.  Per RN patient not eating well at most meals. PO intake documented 0-50% at meals. During previous admission to [facility] patient drank [employer].    Labs:  CMP     Component Value Date/Time   NA 139 06/22/2012 0530   K 3.6 06/22/2012 0530   CL 104 06/22/2012 0530   CO2 22 06/22/2012 0530   GLUCOSE 99 06/22/2012 0530   BUN 11 06/22/2012 0530   CREATININE 0.59 06/22/2012 0530   CALCIUM 9.1 06/22/2012 0530   PROT 6.5 05/26/2012 2013   ALBUMIN 3.4* 05/26/2012 2013   AST 17 05/26/2012 2013   ALT 16 05/26/2012 2013   ALKPHOS 62 05/26/2012 2013   BILITOT 0.2* 05/26/2012 2013   GFRNONAA >90 06/22/2012 0530   GFRAA >90 06/22/2012 0530    Intake/Output Summary (Last 24 hours) at 06/22/12 1429 Last data filed at 06/22/12 1207  Gross per 24 hour  Intake 461.25 ml  Output      0 ml  Net 461.25 ml     Diet Order: General  Supplements/Tube Feeding: none at this time  IVF:    0.9 % NaCl with KCl 20 mEq / L Last Rate: 75 mL/hr at 06/22/12 0632    Estimated Nutritional Needs:   Kcal: 1610-9604 Protein: 65-82 grams  Fluid:  1 ml per kcal intake  NUTRITION DIAGNOSIS: -Inadequate oral intake (NI-2.1).  Status: Ongoing  RELATED TO: altered mental status and unintentional weight loss  AS EVIDENCE BY: PO intake documented 0-50% at meals and weight loss of 70 lb over 6 months.   MONITORING/EVALUATION(Goals): PO intake, weights, labs, I/O's 1. PO intake > 50% at meals and supplements.  2. Minimize weight loss/ promote weight gain.   EDUCATION NEEDS: -No education needs identified at this time  INTERVENTION: 1. Will order patient Ensure BID, provides 500 kcal and 18 grams of protein daily.  2. RD to follow for nutrition plan of care.   Dietitian [phone]  DOCUMENTATION CODES Per approved criteria  -Severe malnutrition in the context of chronic illness  *Patient meets malnutrition criteria due to PO intake likely meeting < 75% of estimated energy needs for > or equal to 1 month and  unintentional weight loss of 38% from baseline over 6 months.   Iven Finn [facility] 06/22/2012, 1:52 PM

## 2012-06-23 ENCOUNTER — Encounter (HOSPITAL_COMMUNITY): Payer: Self-pay | Admitting: Emergency Medicine

## 2012-06-23 ENCOUNTER — Emergency Department (HOSPITAL_COMMUNITY)
Admission: EM | Admit: 2012-06-23 | Discharge: 2012-06-24 | Disposition: A | Discharge status: Home / Self Care | Payer: Medicaid Other | Attending: Emergency Medicine | Admitting: Emergency Medicine

## 2012-06-23 DIAGNOSIS — Z809 Family history of malignant neoplasm, unspecified: Secondary | ICD-10-CM | POA: Insufficient documentation

## 2012-06-23 DIAGNOSIS — F411 Generalized anxiety disorder: Secondary | ICD-10-CM | POA: Insufficient documentation

## 2012-06-23 DIAGNOSIS — Z8249 Family history of ischemic heart disease and other diseases of the circulatory system: Secondary | ICD-10-CM | POA: Insufficient documentation

## 2012-06-23 DIAGNOSIS — Z91040 Latex allergy status: Secondary | ICD-10-CM | POA: Insufficient documentation

## 2012-06-23 DIAGNOSIS — H5710 Ocular pain, unspecified eye: Secondary | ICD-10-CM | POA: Insufficient documentation

## 2012-06-23 DIAGNOSIS — Z833 Family history of diabetes mellitus: Secondary | ICD-10-CM | POA: Insufficient documentation

## 2012-06-23 MED ORDER — BENZTROPINE MESYLATE 1 MG PO TABS: 1.0000 mg | ORAL_TABLET | Freq: Two times a day (BID) | ORAL | Status: DC

## 2012-06-23 NOTE — ED Notes (Signed)
Last Saturday, accidently took more medication then she should--unsure of what meds, both eyes have been dilated since then; pt told ems that she cannot follow finger, but has been able to move eyes for ems; pt has already been seen at [facility] for same and was told to follow up with optometrist; 126/84, 98 HR

## 2012-06-23 NOTE — Progress Notes (Signed)
Pt given discharge instructions. Pt verbalized understanding but still needed reinforcement. Pt's family (son and mother) were also told discharge instructions. Pt still concerned about her eyes, MD prescribed medication to take at home. Orson Ape D 06/23/2012

## 2012-06-23 NOTE — ED Notes (Signed)
Reports took about 15 celexa and 5 potassium pills on Sunday at 11am--; reports went to sleep and woke up and reports was "out of my mind" is why took too many pills; pt reports that her eyes have been dilated since, and having trouble seeing; pt able to move eye

## 2012-06-23 NOTE — ED Notes (Signed)
Pt given phone and called her son to come get her per MD

## 2012-06-23 NOTE — Care Management Note (Signed)
    Page 1 of 1   06/23/2012     4:35:11 PM   CARE MANAGEMENT NOTE 06/23/2012  Patient:  Kayla [name], Kayla [name]   Account Number:  [id number]  Date Initiated:  06/23/2012  Documentation initiated by:  Anibal Henderson  Subjective/Objective Assessment:   Admitted with OD of medication, not thought to be intentional. Followed by ACT team and telepsych. Pt lives at home with her mother. She goes to [facility] for her psych care.     Action/Plan:   ACT team and telepsych eval  found  pt to be ok to go home with close OP follow-up. Appt made with [facility] for 1000 tomorrow, and the counselor will  come to her home, because she missed the last 2 appointments scheduled with [facility]   Anticipated DC Date:  06/23/2012   Anticipated DC Plan:  HOME/SELF CARE         Choice offered to / List presented to:             Status of service:  Completed, signed off Medicare Important Message given?   (If response is "NO", the following Medicare IM given date fields will be blank) Date Medicare IM given:   Date Additional Medicare IM given:    Discharge Disposition:  HOME/SELF CARE  Per UR Regulation:    If discussed at Long Length of Stay Meetings, dates discussed:    Comments:  06/23/12 1100 Anibal Henderson RN

## 2012-06-24 ENCOUNTER — Encounter (HOSPITAL_COMMUNITY): Payer: Self-pay | Admitting: Emergency Medicine

## 2012-06-24 ENCOUNTER — Emergency Department (HOSPITAL_COMMUNITY)
Admission: EM | Admit: 2012-06-24 | Discharge: 2012-06-24 | Disposition: A | Discharge status: Home / Self Care | Payer: Medicaid Other | Attending: Emergency Medicine | Admitting: Emergency Medicine

## 2012-06-24 DIAGNOSIS — F419 Anxiety disorder, unspecified: Secondary | ICD-10-CM

## 2012-06-24 DIAGNOSIS — Z046 Encounter for general psychiatric examination, requested by authority: Secondary | ICD-10-CM | POA: Insufficient documentation

## 2012-06-24 MED ORDER — IBUPROFEN 400 MG PO TABS
800.0000 mg | ORAL_TABLET | Freq: Once | ORAL | Status: AC
  Administered 2012-06-24: 800 mg via ORAL
  Filled 2012-06-24: qty 2

## 2012-06-24 NOTE — ED Notes (Signed)
Pt seen multiple times over past weeks for psych evaluation. Pt states "my teeth wont come clean" and "those pills did something to me" states she's taking celexa and potassium. Denies SI and HI

## 2012-06-24 NOTE — ED Notes (Signed)
Pt alert and oriented, with steady gait at time of discharge. Pt given discharge papers and papers explained. All questions answered and pt walked to discharge.  

## 2012-06-24 NOTE — ED Notes (Signed)
Spoke with the pt's mother about transport back home pt has meeting with [facility] at 10am at her home.

## 2012-06-24 NOTE — ED Notes (Signed)
Pt walked out to the parking lot without staffs knowledge. Pt found by other staff members and brought back to room. Pt informed that she needs to wait for her son to get here before she can leave. Sitter at bedside per MD

## 2012-06-24 NOTE — ED Notes (Signed)
Spoke with Jerilynn Som Catering manager) at [facility] to confirm appt time today.  Per Jerilynn Som, " they are going to PT home at 10 am today.  Nurse informed.

## 2012-06-24 NOTE — ED Provider Notes (Signed)
History     CSN: [id number]  Arrival date & time 06/23/12  2144   First MD Initiated Contact with Patient 06/23/12 2337      Chief Complaint  Patient presents with  . Eye Problem    (Consider location/radiation/quality/duration/timing/severity/associated sxs/prior treatment) HPI History provided by pt.   Pt presents w/ c/o bilateral pupil dilation and eye pain since intentionally overdosing on celexa (15 pills) and potassium (5 pills) 4 days ago.  She is afraid that she has done permanent damage.  No vision changes and is otherwise feeling well.  Per prior chart, pt has a h/o somatization, delusional and anxiety disorder.  She presented w/ same complaint and was admitted [facility] for [facility] on 9/9, was cleared medically and by ACT team and telepsych and discharged home earlier today.  Her family members were present at time of discharge and f/u with [facility] was arranged for tomorrow.   She was instructed to f/u with an ophthalmologist.  Patient reports that she would have to schedule an appointment with an ophthalmologist and she doesn't need one to come to the ED.   Denies SI/HI.   Past Medical History  Diagnosis Date  . Ovarian cyst     cervical polyp  . Anxiety     Panic attacks; somatization  . Palpitations     possible bradycardia  . Delusional disorder   . Somatization disorder     Past Surgical History  Procedure Date  . None 05-22-12    hx of ovarian cysts    Family History  Problem Relation Age of Onset  . Cancer Father   . Heart failure Other   . Hypertension Mother   . Diabetes Mother     History  Substance Use Topics  . Smoking status: Never Smoker   . Smokeless tobacco: Never Used  . Alcohol Use: No    OB History    Grav Para Term Preterm Abortions TAB SAB Ect Mult Living   3 3 3       3       Review of Systems  All other systems reviewed and are negative.    Allergies  Latex  Home Medications   Current Outpatient Rx  Name Route Sig  Dispense Refill  . BENZTROPINE MESYLATE 1 MG PO TABS Oral Take 1 tablet (1 mg total) by mouth 2 (two) times daily. 60 tablet 1  . CITALOPRAM HYDROBROMIDE 40 MG PO TABS Oral Take 1 tablet (40 mg total) by mouth daily. For depression and anxiety. 30 tablet 0  . DILTIAZEM HCL ER COATED BEADS 180 MG PO CP24 Oral Take 1 capsule (180 mg total) by mouth daily. For blood pressure. 30 capsule 0  . MIRTAZAPINE 15 MG PO TABS Oral Take 1 tablet (15 mg total) by mouth at bedtime. For sleep and appetite. 30 tablet 0  . PIMOZIDE 1 MG PO TABS Oral Take 3 tablets (3 mg total) by mouth 2 (two) times daily in the am and at bedtime.. For somatic delusions. 60 tablet 0  . POTASSIUM CHLORIDE ER 10 MEQ PO TBCR Oral Take 10 mEq by mouth 2 (two) times daily.    Marland Kitchen RISPERIDONE 4 MG PO TBDP Oral Take 1 tablet (4 mg total) by mouth at bedtime. For psychosis. 30 tablet 0    BP 129/83  Pulse 101  Temp 98.4 F (36.9 C) (Oral)  Resp 18  SpO2 96%  LMP 06/21/2012  Physical Exam  Nursing note and vitals reviewed. Constitutional: She is  oriented to person, place, and time. She appears well-developed and well-nourished. No distress.  HENT:  Head: Normocephalic and atraumatic.  Eyes: Conjunctivae normal and EOM are normal. Pupils are equal, round, and reactive to light.       Peripheral vision intact.  Globes non-tender.   Neck: Normal range of motion.  Pulmonary/Chest: Effort normal.  Musculoskeletal: Normal range of motion.  Neurological: She is alert and oriented to person, place, and time.  Psychiatric:       Pt is very anxious.  She repeats all of her questions several times.      ED Course  Procedures (including critical care time)  Labs Reviewed - No data to display No results found.   1. Eye pain       MDM  40yo F w/ h/o somatization, delusional and anxiety disorder admitted to [facility] for intentional drug OD after presenting w/ eye complaint.  Discharged today after medical and psych clearance and returns  to ER w/ same eye concerns, despite being instructed to f/u with ophtho.  She preferred to come to ED because she wouldn't need an appointment.  Pt is very anxious, repeats all of her questions and did not seem to be able to rationalize anything that was explained to her.  She required a sitter because found wandering around the parking lot by a tech while we were waiting on her ride.  No SI/HI but I was concerned that she may be a harm to herself.  Her family refused to come to the ED to pick her up and PTAR brought her home.  She has an appointment w/ [facility] at home in the morning and she has been referred to ophtho.       Otilio Miu, [place] 06/24/12 [phone]

## 2012-06-24 NOTE — ED Notes (Signed)
Pt awaiting PTAR, sitter remains at bedside for patient's safety

## 2012-06-24 NOTE — ED Provider Notes (Signed)
History   This chart was scribed for [name], MD by Sofie Rower. The patient was seen in room [facility]8/[facility] and the patient's care was started at 7:45AM.     CSN: [id number]  Arrival date & time 06/24/12  0705   First MD Initiated Contact with Patient 06/24/12 0745      Chief Complaint  Patient presents with  . V70.1    (Consider location/radiation/quality/duration/timing/severity/associated sxs/prior treatment) Patient is a 41 y.o. female presenting with altered mental status. The history is provided by the patient. No language interpreter was used.  Altered Mental Status This is a recurrent problem. The current episode started 1 to 2 hours ago. The problem occurs constantly. The problem has been gradually worsening. Pertinent negatives include no chest pain, no abdominal pain, no headaches and no shortness of breath. Nothing aggravates the symptoms. Nothing relieves the symptoms. She has tried nothing for the symptoms. The treatment provided no relief.   Pt denies SI/HI.  PCP is Dr. Juanetta Gosling.   Past Medical History  Diagnosis Date  . Ovarian cyst     cervical polyp  . Anxiety     Panic attacks; somatization  . Palpitations     possible bradycardia  . Delusional disorder   . Somatization disorder     Past Surgical History  Procedure Date  . None 05-22-12    hx of ovarian cysts    Family History  Problem Relation Age of Onset  . Cancer Father   . Heart failure Other   . Hypertension Mother   . Diabetes Mother     History  Substance Use Topics  . Smoking status: Never Smoker   . Smokeless tobacco: Never Used  . Alcohol Use: No    OB History    Grav Para Term Preterm Abortions TAB SAB Ect Mult Living   3 3 3       3       Review of Systems  Respiratory: Negative for shortness of breath.   Cardiovascular: Negative for chest pain.  Gastrointestinal: Negative for abdominal pain.  Neurological: Negative for headaches.  Psychiatric/Behavioral: Positive  for altered mental status.  All other systems reviewed and are negative.    Allergies  Latex  Home Medications   Current Outpatient Rx  Name Route Sig Dispense Refill  . BENZTROPINE MESYLATE 1 MG PO TABS Oral Take 1 tablet (1 mg total) by mouth 2 (two) times daily. 60 tablet 1  . CITALOPRAM HYDROBROMIDE 40 MG PO TABS Oral Take 1 tablet (40 mg total) by mouth daily. For depression and anxiety. 30 tablet 0  . DILTIAZEM HCL ER COATED BEADS 180 MG PO CP24 Oral Take 1 capsule (180 mg total) by mouth daily. For blood pressure. 30 capsule 0  . MIRTAZAPINE 15 MG PO TABS Oral Take 1 tablet (15 mg total) by mouth at bedtime. For sleep and appetite. 30 tablet 0  . PIMOZIDE 1 MG PO TABS Oral Take 3 tablets (3 mg total) by mouth 2 (two) times daily in the am and at bedtime.. For somatic delusions. 60 tablet 0  . POTASSIUM CHLORIDE ER 10 MEQ PO TBCR Oral Take 10 mEq by mouth 2 (two) times daily.    Marland Kitchen RISPERIDONE 4 MG PO TBDP Oral Take 1 tablet (4 mg total) by mouth at bedtime. For psychosis. 30 tablet 0    BP 145/91  Pulse 141  Temp 97.5 F (36.4 C) (Oral)  Resp 22  Ht 5\' 4"  (1.626 m)  Wt 130  lb (58.968 kg)  BMI 22.31 kg/m2  SpO2 99%  LMP 06/21/2012  Physical Exam  Nursing note and vitals reviewed. Constitutional: She is oriented to person, place, and time. She appears well-developed and well-nourished.  HENT:  Head: Normocephalic and atraumatic.  Eyes: Conjunctivae normal and EOM are normal. Pupils are equal, round, and reactive to light.  Neck: Normal range of motion. Neck supple.  Cardiovascular: Normal rate, regular rhythm and normal heart sounds.   Pulmonary/Chest: Effort normal and breath sounds normal.  Abdominal: Soft. Bowel sounds are normal.  Musculoskeletal: Normal range of motion.  Neurological: She is alert and oriented to person, place, and time.  Skin: Skin is warm and dry.  Psychiatric: She has a normal mood and affect.       Pt is very anxious and paranoid, however,  denies SI/HI.     ED Course  Procedures (including critical care time)  DIAGNOSTIC STUDIES: Oxygen Saturation is 99% on room air, normal by my interpretation.    COORDINATION OF CARE:    7:50AM- Follow up at [facility] discussed with patient. Pt agrees to treatment.   7:51AM- Scheduling of appointments for Pt discussed with nurse.   Labs Reviewed - No data to display No results found.   No diagnosis found.   Pt is not suicidal or homicidal and will be seen by [facility] at her house today at 10 am MDM           [name], MD 06/24/12 [phone]

## 2012-06-26 NOTE — ED Provider Notes (Signed)
Medical screening examination/treatment/procedure(s) were performed by non-physician practitioner and as supervising physician I was immediately available for consultation/collaboration.  John-Adam Cashawn Yanko, M.D.     John-Adam English [place], MD 06/26/12 1622 

## 2012-06-26 NOTE — Discharge Summary (Signed)
Physician Discharge Summary  Patient ID: Kayla [name] MRN: [id number] DOB/AGE: [birth year] 41 y.o. 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      citalopram 40 MG tablet   Commonly known as: CELEXA   Take 1 tablet (40 mg total) by mouth daily. For depression and anxiety.      diltiazem 180 MG 24 hr capsule   Commonly known as: CARDIZEM CD   Take 1 capsule (180 mg total) by mouth daily. For blood pressure.      mirtazapine 15 MG tablet   Commonly known as: REMERON   Take 1 tablet (15 mg total) by mouth at bedtime. For sleep and appetite.      Pimozide 1 MG Tabs   Take 3 tablets (3 mg total) by mouth 2 (two) times daily in the am and at bedtime.. For somatic delusions.      potassium chloride 10 MEQ tablet   Commonly known as: K-DUR   Take 10 mEq by mouth 2 (two) times daily.      risperiDONE 4 MG disintegrating tablet   Commonly known as: RISPERDAL M-TABS   Take 1 tablet (4 mg total) by mouth at bedtime. For psychosis.        Discharged Condition:improved    Consults: Psychiatry  Significant Diagnostic Studies: No results found.  Lab Results: Basic Metabolic Panel: No results found for this basename: NA:2,K:2,CL:2,CO2:2,GLUCOSE:2,BUN:2,CREATININE:2,CALCIUM:2,MG:2,PHOS:2 in the last 72 hours Liver Function Tests: No results found for this basename:  AST:2,ALT:2,ALKPHOS:2,BILITOT:2,PROT:2,ALBUMIN:2 in the last 72 hours   CBC: No results found for this basename: WBC:2,NEUTROABS:2,HGB:2,HCT:2,MCV:2,PLT:2 in the last 72 hours  No results found for this or any previous visit (from the past 240 hour(s)).   Hospital Course: She was submitted with overdose of her psychiatric medications. She has significant problem with somatization disorder and her somatization this time was related to her eyes. She continued to complain of that stating that she could not move her eyes but clearly could. She was cleared by psychiatry and by the act team and was sent home to have early followup with her psychiatrist Discharge Exam: Blood pressure 101/70, pulse 87, temperature 98 F (36.7 C), temperature source Oral, resp. rate 18, height 5\' 4"  (1.626 m), weight 49.896 kg (110 lb), last menstrual period 06/21/2012, SpO2 98.00%. She was awake and alert. She had full range of motion of her eyes. Her chest is clear. She was obssessed with eye movement . Disposition: Home with early followup with [facility] and they will actually come see her at her home      Discharge Orders    Future Orders Please Complete By Expires   Discharge patient         Follow-up Information    Follow up with [facility]. On 06/24/2012.   Contact  information:   Robinette Haines will see you at your home at 10 AM         Signed: Jarom Govan Elbert Ewings Pager [phone]  06/26/2012, 9:34 AM

## 2012-06-27 NOTE — Discharge Summary (Signed)
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  AXIS II: Deferred  AXIS III: 1. Ovarian Cyst.  2. History of Heart Palpitations.  AXIS IV: Chronic Mental Illness. Limited Insight into Illness. Limited Primary Support. Corporate treasurer. Unemployment.  AXIS V: GAF at time of admission approximately 35. GAF at time of discharge approximately 50.   Level of Care:  Inpatient Hospitalization.  Reason for Admission:  Admitted 6/6-6/14 for delusional disorder-somatic type. Did not make her followup appointment and has been non-compliant with meds.Missed her appointment with ENT MD for eval of "sore throat for 8 mos from radiation" due to going to ED. Told her mother that she had drank Windex trying to kill herself and when that didn't work she planned to jump jump in the pond. Mother requested admission as patient is so delusional.   Hospital Course:   The patient attended treatment team meeting this am and met with treatment team members. The patient's symptoms, treatment plan and response to treatment was discussed. The patient endorsed that their symptoms have improved. The patient also stated that they felt stable for discharge.  They reported that from this hospital stay they had learned many coping skills.  In other to maintain their psychiatric stability, they will continue psychiatric care on an outpatient basis. They will follow-up as outlined below.  In addition they were instructed  to take all your medications as prescribed by their mental healthcare provider and to report any adverse effects and or reactions from your medicines to  their outpatient provider promptly.  The patient is also instructed and cautioned to not engage in alcohol and or illegal drug use while on prescription medicines.  In the event of worsening symptoms the patient is instructed to call the crisis hotline, 911 and or go to the nearest ED for appropriate evaluation and treatment of symptoms.   Also while a patient in this hospital, the patient received medication management for his psychiatric symptoms. They were ordered and received as outlined below:    Medication List     As of 06/27/2012  6:25 PM    STOP taking these medications         haloperidol 2 MG tablet   Commonly known as: HALDOL      potassium chloride 10 MEQ tablet   Commonly known as: K-DUR,KLOR-CON      traZODone 100 MG tablet   Commonly known as: DESYREL      TAKE these medications      Indication    citalopram 40 MG tablet   Commonly known as: CELEXA   Take 1 tablet (40 mg total) by mouth daily. For depression and anxiety.       diltiazem 180 MG 24 hr capsule   Commonly known as: CARDIZEM CD   Take 1 capsule (180 mg total) by mouth daily. For blood pressure.       mirtazapine 15 MG tablet   Commonly known as: REMERON   Take 1 tablet (15 mg total) by mouth at bedtime. For sleep and appetite.       Pimozide 1 MG Tabs   Take 3 tablets (3 mg  total) by mouth 2 (two) times daily in the am and at bedtime.. For somatic delusions.       risperiDONE 4 MG disintegrating tablet   Commonly known as: RISPERDAL M-TABS   Take 1 tablet (4 mg total) by mouth at bedtime. For psychosis.        They were also enrolled in group counseling sessions and activities in which they participated actively.       Follow-up Information    Follow up with [facility]. (Pt. to follow-up for a hospital discharge appt for clinical intake.  Pt. is to be referred for ACTT services upon completion of clinical assessment.  )    Contact information:   [address]  [place], [place]  [phone]       Follow up with [facility]. (Pt. was accepted for care coordination [facility].  Isaiah Serge will be the care coordinator assigned and will be following up with patient to assist with ensuring patient is able to keep appts.)    Contact information:   Care Coordination [phone]      Follow up with Arna Medici on 06/01/2012. (Appointment scheduled at 8:30 am, referral # [id number])    Contact information:   [address] [place], [place] [address] [phone]        Upon discharge, patient adamantly denies suicidal, homicidal ideations, auditory, visual hallucinations and or delusional thinking. They left [facility] with all personal belongings via personal transportation in no apparent distress.  Consults:  Please see the patient's electronic medical record for more detail.  Significant Diagnostic Studies:  Please see the patient's electronic medical record for more detail.   Discharge Vitals:   Blood pressure 120/83, pulse 103, temperature 98 F (36.7 C), temperature source Oral, resp. rate 16, height 5' 3.5" (1.613 m), weight 60.328 kg (133 lb), last menstrual period 05/12/2012, SpO2 97.00%..  Mental Status Exam: Demographic factors:  Caucasian;Unemployed  Current Mental Status Per Nursing Assessment::  On Admission: (denies si/hi/av. )  At Discharge: Time was spent today discussing with the patient her current symptoms. The patient states that she continues to sleep "ok" last night. She reports an "ok" appetite with no nausea or vomiting reported. She is exhibiting ongoing moderate feelings of sadness, anhedonia and depressed mood but denies any depressive symptoms. The patient also adamantly denies any suicidal or homicidal ideations. She also denies any anxiety symptoms today. The patient continues to deny any auditory or visual hallucinations but has ongoing somatic delusions today as described above. The patient is requesting discharge and at this time is not committable. She is being  discharged to outpatient follow up at her request.  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  The patient was seen today and reports the following:  ADL's: Intact.  Sleep: The patient reports to sleeping "ok" last night.  Appetite: The patient reports that her appetite is "ok" today.  Mild>(1-10) >Severe  Hopelessness (1-10): 0  Depression (1-10): 1-2  Anxiety (1-10): 0  Suicidal Ideation: The patient adamantly denies any suicidal ideations today.  Plan: No  Intent: No  Means: No  Homicidal Ideation: The patient adamantly denies any homicidal ideations today.  Plan: No  Intent: No.  Means: No  General Appearance/Behavior: The patient was cooperative today with this provider but remained preoccupied with her somatic concerns today.  Eye Contact: Good.  Speech: Appropriate in rate and volume with no pressuring noted today.  Motor Behavior: wnl.  Level of Consciousness: Alert  and Oriented x 3.  Mental Status: Alert and Oriented x 3.  Mood: Appears moderately depressed.  Affect: Moderately Constricted.  Anxiety Level: No anxiety reported today.  Thought Process: Delusional in her thinking with somatic delusions noted.  Thought Content: The patient denies any auditory or visual hallucinations today. She is displaying ongoing somatic delusions today.  Perception: Delusional in her thinking.  Judgment: Fair.  Insight: Poor.  Cognition: Oriented to person, place and time.  Current Medications:  .  cephALEXin  250 mg  Oral  TID WC & HS   .  citalopram  40 mg  Oral  Daily   .  diltiazem  180 mg  Oral  Daily   .  feeding supplement  237 mL  Oral  TID BM   .  mirtazapine  15 mg  Oral  QHS   .  pimozide  3 mg  Oral  BH-qamhs   .  risperiDONE  4 mg  Oral  QHS   .  DISCONTD: pimozide  2 mg  Oral  BH-qamhs    Loss Factors:  Decline in physical health Financial Constraints.  Historical Factors:  Long history of somatic delusions.  Non-compliance with medications. Limited primary support.  Risk Reduction Factors:  The patient has a Futures trader. Good access to healthcare.  Continued Clinical Symptoms:  Previous Psychiatric Diagnoses and Treatments  Delusional Disorder.  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  AXIS II: Deferred  AXIS III: 1. Ovarian Cyst.  2. History of Heart Palpitations.  AXIS IV: Chronic Mental Illness. Limited Insight into Illness. Limited Primary Support. Corporate treasurer. Unemployment.  AXIS V: GAF at time of admission approximately 35. GAF at time of discharge approximately 50.  Cognitive Features That Contribute To Risk:  Closed-mindedness  Polarized thinking  Thought constriction (tunnel vision)  Physical Findings:  AIMS: Facial and Oral Movements  Muscles of Facial Expression: None, normal  Lips and Perioral Area: None, normal  Jaw: None, normal  Tongue: None, normal,Extremity Movements  Upper (arms, wrists, hands, fingers): None, normal  Lower (legs, knees, ankles, toes): None, normal, Trunk Movements  Neck, shoulders, hips: None, normal, Overall Severity  Severity of abnormal movements (highest score from questions above): None, normal  Incapacitation due to abnormal movements: None, normal  Patient's awareness of abnormal movements (rate only patient's report): No Awareness, Dental Status  Current problems with teeth and/or dentures?: No  Does patient usually wear dentures?: No  Review of Systems:  Neurological: The patient denies any headaches today. She denies any seizures or dizziness.  G.I.: The patient denies any constipation or G.I. Upset today.  Musculoskeletal: The patient denies any muscle or skeletal difficulties.  HEENT: She does report an ongoing sore throat which has occurred for several months. She also reports a "sore on her lip" which cannot be seen by this examiner.  Time was spent today discussing with the patient her current  symptoms. The patient states that she continues to sleep "ok" last night. She reports an "ok" appetite with no nausea or vomiting reported. She is exhibiting ongoing moderate feelings of sadness, anhedonia and depressed mood but denies any depressive symptoms. The patient also adamantly denies any suicidal or homicidal ideations. She also denies any anxiety symptoms today. The patient continues to deny any auditory or visual hallucinations but has ongoing somatic delusions today as described above. The patient is requesting discharge and at this time is not committable. She is being discharged to outpatient follow up at  her request.  Treatment Plan Summary:  1. Daily contact with patient to assess and evaluate symptoms and progress in treatment.  2. Medication management  3. The patient will deny suicidal ideations or homicidal ideations for 48 hours prior to discharge and have a depression and anxiety rating of 3 or less. The patient will also deny any auditory or visual hallucinations or delusional thinking.  4. The patient will deny any symptoms of substance withdrawal at time of discharge.  Plan:  1. Will continue the medication Risperdal at 4 mgs po qhs for her somatic delusions.  2. Will continue the medication Orap at 3 mg po q am and hs to continue to address her somatic delusions.  3. Will continue the medication Celexa at 40 mgs po q am for depression and obsessive thinking.  4. Will continue the medication Keflex at 250 mgs po TID-WC and hs x 10 days for cellulitis and possible UTI,  5. Will continue the medication Remeron at 15 mgs po qhs for sleep, appetite and depression.  6. Laboratory Studies reviewed.  7. Will continue to monitor.  8. The patient will be discharged today to outpatient follow up at her request.  Suicide Risk:  Minimal: No identifiable suicidal ideation. Patients presenting with no risk factors but with morbid ruminations; may be classified as minimal risk based on the  severity of the depressive symptoms  Plan Of Care/Follow-up recommendations:  Activity: As tolerated.  Diet: Regular Diet. Please consider also drinking Ensure with each meal.  Other: Please take all medications only as directed and keep all scheduled follow up appointments.  Discharge destination:  Home  Is patient on multiple antipsychotic therapies at discharge:  Yes  Has Patient had three or more failed trials of antipsychotic monotherapy by history: Yes Recommended Plan for Multiple Antipsychotic Therapies: No Discharge Orders    Future Orders Please Complete By Expires   Diet - low sodium heart healthy      Increase activity slowly      Discharge instructions      Comments:   Please take all medications only as directed and keep all scheduled follow up appointments.       Medication List     As of 06/27/2012  6:25 PM    STOP taking these medications         haloperidol 2 MG tablet   Commonly known as: HALDOL      potassium chloride 10 MEQ tablet   Commonly known as: K-DUR,KLOR-CON      traZODone 100 MG tablet   Commonly known as: DESYREL      TAKE these medications      Indication    citalopram 40 MG tablet   Commonly known as: CELEXA   Take 1 tablet (40 mg total) by mouth daily. For depression and anxiety.       diltiazem 180 MG 24 hr capsule   Commonly known as: CARDIZEM CD   Take 1 capsule (180 mg total) by mouth daily. For blood pressure.       mirtazapine 15 MG tablet   Commonly known as: REMERON   Take 1 tablet (15 mg total) by mouth at bedtime. For sleep and appetite.       Pimozide 1 MG Tabs   Take 3 tablets (3 mg total) by mouth 2 (two) times daily in the am and at bedtime.. For somatic delusions.       risperiDONE 4 MG disintegrating tablet   Commonly known as: RISPERDAL  M-TABS   Take 1 tablet (4 mg total) by mouth at bedtime. For psychosis.            Follow-up Information    Follow up with [facility]. (Pt. to follow-up for a hospital  discharge appt for clinical intake.  Pt. is to be referred for ACTT services upon completion of clinical assessment.  )    Contact information:   [address]  [place], [place]  [phone]      Follow up with [facility]. (Pt. was accepted for care coordination [facility].  Isaiah Serge will be the care coordinator assigned and will be following up with patient to assist with ensuring patient is able to keep appts.)    Contact information:   Care Coordination [phone]      Follow up with Arna Medici on 06/01/2012. (Appointment scheduled at 8:30 am, referral # [id number])    Contact information:   [address] [place], [place] [address] [phone]        Follow-up recommendations:   Activities: Resume typical activities Diet: Resume typical diet Other: Follow up with outpatient provider and report any side effects to out patient prescriber.  Comments:  Take all your medications as prescribed by your mental healthcare provider. Report any adverse effects and or reactions from your medicines to your outpatient provider promptly. Patient is instructed and cautioned to not engage in alcohol and or illegal drug use while on prescription medicines. In the event of worsening symptoms, patient is instructed to call the crisis hotline, 911 and or go to the nearest ED for appropriate evaluation and treatment of symptoms.  Signed: Franchot Gallo 06/27/2012 6:25 PM

## 2013-04-21 ENCOUNTER — Emergency Department (HOSPITAL_COMMUNITY)
Admission: EM | Admit: 2013-04-21 | Discharge: 2013-04-22 | Disposition: A | Discharge status: Home / Self Care | Payer: MEDICAID | Attending: Emergency Medicine | Admitting: Emergency Medicine

## 2013-04-21 ENCOUNTER — Encounter (HOSPITAL_COMMUNITY): Payer: Self-pay | Admitting: Emergency Medicine

## 2013-04-21 DIAGNOSIS — Z3202 Encounter for pregnancy test, result negative: Secondary | ICD-10-CM | POA: Insufficient documentation

## 2013-04-21 DIAGNOSIS — Z8742 Personal history of other diseases of the female genital tract: Secondary | ICD-10-CM | POA: Insufficient documentation

## 2013-04-21 DIAGNOSIS — F450 Somatization disorder: Secondary | ICD-10-CM | POA: Insufficient documentation

## 2013-04-21 DIAGNOSIS — Z91040 Latex allergy status: Secondary | ICD-10-CM | POA: Insufficient documentation

## 2013-04-21 DIAGNOSIS — F411 Generalized anxiety disorder: Secondary | ICD-10-CM | POA: Insufficient documentation

## 2013-04-21 DIAGNOSIS — F22 Delusional disorders: Secondary | ICD-10-CM

## 2013-04-21 DIAGNOSIS — Z79899 Other long term (current) drug therapy: Secondary | ICD-10-CM | POA: Insufficient documentation

## 2013-04-21 DIAGNOSIS — R002 Palpitations: Secondary | ICD-10-CM | POA: Insufficient documentation

## 2013-04-21 LAB — RAPID URINE DRUG SCREEN, HOSP PERFORMED
Amphetamines: NOT DETECTED
Benzodiazepines: NOT DETECTED
Opiates: NOT DETECTED

## 2013-04-21 LAB — CBC WITH DIFFERENTIAL/PLATELET
Eosinophils Absolute: 0 10*3/uL (ref 0.0–0.7)
HCT: 42.1 % (ref 36.0–46.0)
Hemoglobin: 14.8 g/dL (ref 12.0–15.0)
Lymphs Abs: 2.1 10*3/uL (ref 0.7–4.0)
MCH: 29.4 pg (ref 26.0–34.0)
Monocytes Relative: 11 % (ref 3–12)
Neutro Abs: 6.5 10*3/uL (ref 1.7–7.7)
Neutrophils Relative %: 68 % (ref 43–77)
RBC: 5.03 MIL/uL (ref 3.87–5.11)

## 2013-04-21 LAB — URINALYSIS, ROUTINE W REFLEX MICROSCOPIC
Glucose, UA: NEGATIVE mg/dL
Specific Gravity, Urine: 1.02 (ref 1.005–1.030)

## 2013-04-21 LAB — URINE MICROSCOPIC-ADD ON

## 2013-04-21 LAB — BASIC METABOLIC PANEL
BUN: 12 mg/dL (ref 6–23)
Chloride: 99 mEq/L (ref 96–112)
Glucose, Bld: 122 mg/dL — ABNORMAL HIGH (ref 70–99)
Potassium: 3.5 mEq/L (ref 3.5–5.1)

## 2013-04-21 LAB — PREGNANCY, URINE: Preg Test, Ur: NEGATIVE

## 2013-04-21 NOTE — BH Assessment (Signed)
Assessment Note   Kayla [name] is an 42 y.o. female. PT REPORTED TO [facility] AND COMPLAINED OF HER EYES BEING DILATED, INCREASED ANXIETY AND REPORTING SHE HAD STOPPED TAKING HER MEDICATIONS 3 WEEKS AGO.  SHE REPORTS HER MEDICATIONS LEADS TO HER EYES BEING DILATED.  THIS IS THE SAME OR SIMILAR   EPISODE THAT OCCURRED 1 YR AGO WHEN PT OVERDOSED ON HER CELEXA . SHE REPORTED TO THE [facility] COUNSELOR THAT, " I NO LONGER WANT TO BE HERE" PT DENIES  H/I AND A/V HALLUCINATIONS BUT SHE IS DELUSIONAL AND CONFUSED AND IS CURRENTLY A DANGER TO HERSELF.  [facility] PETITIONED ON PT REPORTING SHE NEEDS STABILIZATION OF HER MEDICATIONS. PT DOES HAVE A HISTORY OF PRIOR SUICIDE ATTEMPT.  PT REPORTS SHE LIVES WITH HER MOTHER WHO IS BLIND AND IS UNABLE TO HELP HER WITH HER MEDICATIONS BUT SHE DOES HAVE ADULT CHILDREN IN THE HOME THAT CAN HELP HER.     Axis I: Delusional d/o Axis II: Deferred Axis III:  Past Medical History  Diagnosis Date  . Ovarian cyst     cervical polyp  . Anxiety     Panic attacks; somatization  . Palpitations     possible bradycardia  . 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 Somatization disorder    Axis IV: other psychosocial or environmental problems and problems with primary support group Axis V: 21-30 behavior considerably influenced by delusions or hallucinations OR serious impairment in judgment, communication OR inability to function in almost all areas  Past Medical History:  Past Medical History  Diagnosis Date  . Ovarian cyst     cervical polyp  . Anxiety     Panic attacks; somatization  . Palpitations     possible bradycardia  . 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 Somatization disorder     Past Surgical History  Procedure Laterality Date  . None  05-22-12    hx of ovarian cysts    Family History:  Family History  Problem Relation Age of Onset  . Cancer Father   . Heart failure Other   . Hypertension Mother   . Diabetes Mother     Social History:   reports that she has never smoked. She has never used smokeless tobacco. She reports that she does not drink alcohol or use illicit drugs.  Additional Social History:  Alcohol / Drug Use Pain Medications: NA Prescriptions: NA Over the Counter: NA History of alcohol / drug use?: No history of alcohol / drug abuse  CIWA: CIWA-Ar BP: 116/94 mmHg Pulse Rate: 135 COWS:    Allergies:  Allergies  Allergen Reactions  . Latex Itching    Home Medications:  (Not in a hospital admission)  OB/GYN Status:  No LMP recorded.  General Assessment Data Location of Assessment: [facility] ACT Assessment: Yes Living Arrangements: Parent (AND HER 3 GROWN CHILDREN  AGES 18, 20 AND 21) Can pt return to current living arrangement?: Yes Admission Status: Involuntary Is patient capable of signing voluntary admission?: No Transfer from: Acute Hospital [facility]) Referral Source: MD (DR [facility])     Risk to self Suicidal Ideation: No Suicidal Intent: No Is patient at risk for suicide?: No Suicidal Plan?: No Access to Means: No What has been your use of drugs/alcohol within the last 12 months?: DENIES Previous Attempts/Gestures: Yes How many times?: 2 Other Self Harm Risks: NA Triggers for Past Attempts: Unpredictable Intentional Self Injurious Behavior: None Family Suicide History: Unknown Recent stressful life event(s): Other (Comment) (UNK) Persecutory voices/beliefs?:  No Depression: Yes Depression Symptoms: Despondent;Loss of interest in usual pleasures;Feeling worthless/self pity;Fatigue Substance abuse history and/or treatment for substance abuse?: No Suicide prevention information given to non-admitted patients: Not applicable  Risk to Others Homicidal Ideation: No Thoughts of Harm to Others: No Current Homicidal Intent: No Current Homicidal Plan: No Access to Homicidal Means: No Identified Victim: NA History of harm to others?: No Assessment of Violence: None Noted Violent  Behavior Description: NA Does patient have access to weapons?: No Criminal Charges Pending?: No Does patient have a court date: No  Psychosis Hallucinations: None noted Delusions: Somatic (REPORTING EYES ARE DIALATED)  Mental Status Report Appear/Hygiene: Improved Eye Contact: Good Motor Activity: Freedom of movement;Restlessness Speech: Pressured;Logical/coherent (CONTINUES TO TALK ABOUT HER MEDS AND HER EYES) Level of Consciousness: Alert Mood: Despair;Helpless;Preoccupied;Depressed Affect: Appropriate to circumstance;Depressed;Frightened;Preoccupied Anxiety Level: Minimal Thought Processes: Circumstantial;Flight of Ideas Judgement: Impaired Orientation: Person;Place;Time;Situation Obsessive Compulsive Thoughts/Behaviors: Moderate  Cognitive Functioning Concentration: Decreased Memory: Recent Intact;Remote Intact IQ: Average Insight: Poor Impulse Control: Poor Appetite: Fair Weight Loss: 0 Weight Gain: 0 Sleep: Decreased Total Hours of Sleep: 4 Vegetative Symptoms: None  ADLScreening [facility]) Patient's cognitive ability adequate to safely complete daily activities?: Yes Patient able to express need for assistance with ADLs?: Yes Independently performs ADLs?: Yes (appropriate for developmental age)  Abuse/Neglect [facility]) Physical Abuse: Denies Verbal Abuse: Denies Sexual Abuse: Denies  Prior Inpatient Therapy Prior Inpatient Therapy: Yes Prior Therapy Dates: 2013 Prior Therapy Facilty/Provider(s): [facility] X 2 Reason for Treatment: DELUSIONAL/SUICIDAL  Prior Outpatient Therapy Prior Outpatient Therapy: Yes Prior Therapy Dates: CURRENTLY Prior Therapy Facilty/Provider(s): [facility] Reason for Treatment: DELUSIONAL  ADL Screening (condition at time of admission) Patient's cognitive ability adequate to safely complete daily activities?: Yes Is the patient deaf or have difficulty hearing?: No Does the patient have difficulty seeing, even when  wearing glasses/contacts?: No Does the patient have difficulty concentrating, remembering, or making decisions?: No Patient able to express need for assistance with ADLs?: Yes Does the patient have difficulty dressing or bathing?: No Independently performs ADLs?: Yes (appropriate for developmental age) Does the patient have difficulty walking or climbing stairs?: No Weakness of Legs: None Weakness of Arms/Hands: None  Home Assistive Devices/Equipment Home Assistive Devices/Equipment: None  Therapy Consults (therapy consults require a physician order) PT Evaluation Needed: No OT Evalulation Needed: No SLP Evaluation Needed: No Abuse/Neglect Assessment (Assessment to be complete while patient is alone) Physical Abuse: Denies Verbal Abuse: Denies Sexual Abuse: Denies Exploitation of patient/patient's resources: Denies Self-Neglect: Denies Values / Beliefs Cultural Requests During Hospitalization: None Spiritual Requests During Hospitalization: None Consults Spiritual Care Consult Needed: No Social Work Consult Needed: No Merchant navy officer (For Healthcare) Advance Directive: Patient does not have advance directive;Patient would not like information Pre-existing out of facility DNR order (yellow form or pink MOST form): No    Additional Information 1:1 In Past 12 Months?: No CIRT Risk: No Elopement Risk: No Does patient have medical clearance?: Yes     Disposition: REFERRED TO [facility] Disposition Initial Assessment Completed for this Encounter: Yes Disposition of Patient: Inpatient treatment program Type of inpatient treatment program: Adult (REFERRED TO [facility])  On Site Evaluation by:   Reviewed with Physician:  DR Samuel Jester   Hattie Perch Winford 04/21/2013 10:04 PM

## 2013-04-21 NOTE — ED Notes (Signed)
Pt IVC. Brought in by [facility]. Per papers pt is delusional, tearful and posses a threat to self and others. Pt stopped medication x 3 weeks ago and stated "i don't want to be here anymore". Pt has h/s suicide attempts. Per pt she is here because her eyes have been dialated x 3 days she she wants to know why.

## 2013-04-21 NOTE — ED Notes (Addendum)
Pt alert and cooperative at this time.  Pt to department with IVC papers.  Pt states that her eyes are dilated and she is concerned at to why.  Pt states that she was at [facility] and was expressing concern over her eyes and "jokingly said "I don't want to be here anymore." "   EDP at bedside to evaluate pt.

## 2013-04-21 NOTE — ED Provider Notes (Signed)
History    CSN: [id number] Arrival date & time 04/21/13  1711  First MD Initiated Contact with Patient 04/21/13 1947     Chief Complaint  Patient presents with  . Medical Clearance   HPI Pt was seen at 2015.  Per Police and IVC paperwork, pt c/o gradual onset and persistence of constant perseveration that her "eyes are dilated" for the past 3 days. Pt states she went to [facility] and "was crying" and "that's why they brought me here."  IVC paperwork from [facility] states pt is delusional, stopped taking her psych meds 3 weeks ago and told them she "didn't want to be here anymore." They are requesting inpatient admission for stabilization. Pt has previous hx of SI with SA by OD over a somatic delusion about her eyes.  Denies SA or HI today.    Past Medical History  Diagnosis Date  . Ovarian cyst     cervical polyp  . Anxiety     Panic attacks; somatization  . Palpitations     possible bradycardia  . 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 Somatization disorder    Past Surgical History  Procedure Laterality Date  . None  05-22-12    hx of ovarian cysts   Family History  Problem Relation Age of Onset  . Cancer Father   . Heart failure Other   . Hypertension Mother   . Diabetes Mother    History  Substance Use Topics  . Smoking status: Never Smoker   . Smokeless tobacco: Never Used  . Alcohol Use: No   OB History   Grav Para Term Preterm Abortions TAB SAB Ect Mult Living   3 3 3       3      Review of Systems ROS: Statement: All systems negative except as marked or noted in the HPI; Constitutional: Negative for fever and chills. ; ; Eyes: Negative for eye pain, redness and discharge. ; ; ENMT: Negative for ear pain, hoarseness, nasal congestion, sinus pressure and sore throat. ; ; Cardiovascular: Negative for chest pain, palpitations, diaphoresis, dyspnea and peripheral edema. ; ; Respiratory: Negative for cough, wheezing and stridor. ; ; Gastrointestinal: Negative for nausea,  vomiting, diarrhea, abdominal pain, blood in stool, hematemesis, jaundice and rectal bleeding. . ; ; Genitourinary: Negative for dysuria, flank pain and hematuria. ; ; Musculoskeletal: Negative for back pain and neck pain. Negative for swelling and trauma.; ; Skin: Negative for pruritus, rash, abrasions, blisters, bruising and skin lesion.; ; Neuro: Negative for headache, lightheadedness and neck stiffness. Negative for weakness, altered level of consciousness , altered mental status, extremity weakness, paresthesias, involuntary movement, seizure and syncope.; Psych:  No SI, no SA, no HI,+delusion.      Allergies  Latex  Home Medications   Current Outpatient Rx  Name  Route  Sig  Dispense  Refill  . benztropine (COGENTIN) 1 MG tablet   Oral   Take 2 mg by mouth at bedtime.         Marland Kitchen buPROPion (WELLBUTRIN SR) 150 MG 12 hr tablet   Oral   Take 150 mg by mouth 2 (two) times daily.         . citalopram (CELEXA) 40 MG tablet   Oral   Take 1 tablet (40 mg total) by mouth daily. For depression and anxiety.   30 tablet   0   . clonazePAM (KLONOPIN) 1 MG tablet   Oral   Take 1 mg by mouth 4 (four) times daily as  needed for anxiety.          Marland Kitchen diltiazem (CARDIZEM CD) 180 MG 24 hr capsule   Oral   Take 1 capsule (180 mg total) by mouth daily. For blood pressure.   30 capsule   0   . potassium chloride (K-DUR) 10 MEQ tablet   Oral   Take 20 mEq by mouth daily.          Marland Kitchen RisperiDONE (RISPERDAL PO)   Oral   Take 6 mg by mouth at bedtime.         . traZODone (DESYREL) 100 MG tablet   Oral   Take 200 mg by mouth at bedtime.          BP 116/94  Pulse 135  Temp(Src) 98.4 F (36.9 C) (Oral)  Resp 16  SpO2 98% Physical Exam 2020: Physical examination:  Nursing notes reviewed; Vital signs and O2 SAT reviewed;  Constitutional: Well developed, Well nourished, Well hydrated, In no acute distress; Head:  Normocephalic, atraumatic; Eyes: EOMI, PERRL, No scleral icterus;  ENMT: Mouth and pharynx normal, Mucous membranes moist; Neck: Supple, Full range of motion, No lymphadenopathy; Cardiovascular: Regular rate and rhythm, No gallop; Respiratory: Breath sounds clear & equal bilaterally, No rales, rhonchi, wheezes.  Speaking full sentences with ease, Normal respiratory effort/excursion; Chest: Nontender, Movement normal; Abdomen: Soft, Nontender, Nondistended, Normal bowel sounds; Genitourinary: No CVA tenderness; Extremities: Pulses normal, No tenderness, No edema, No calf edema or asymmetry.; Neuro: AA&Ox3, Major CN grossly intact.  Speech clear. Climbs on and off stretcher easily by herself. Gait steady. No gross focal motor or sensory deficits in extremities.; Skin: Color normal, Warm, Dry.; Psych:  Affect flat.    ED Course  Procedures    MDM  MDM Reviewed: previous chart, nursing note and vitals Interpretation: labs   Results for orders placed during the hospital encounter of 04/21/13  CBC WITH DIFFERENTIAL      Result Value Range   WBC 9.7  4.0 - 10.5 K/uL   RBC 5.03  3.87 - 5.11 MIL/uL   Hemoglobin 14.8  12.0 - 15.0 g/dL   HCT 01.0  27.2 - 53.6 %   MCV 83.7  78.0 - 100.0 fL   MCH 29.4  26.0 - 34.0 pg   MCHC 35.2  30.0 - 36.0 g/dL   RDW 64.4  03.4 - 74.2 %   Platelets 287  150 - 400 K/uL   Neutrophils Relative % 68  43 - 77 %   Neutro Abs 6.5  1.7 - 7.7 K/uL   Lymphocytes Relative 21  12 - 46 %   Lymphs Abs 2.1  0.7 - 4.0 K/uL   Monocytes Relative 11  3 - 12 %   Monocytes Absolute 1.1 (*) 0.1 - 1.0 K/uL   Eosinophils Relative 0  0 - 5 %   Eosinophils Absolute 0.0  0.0 - 0.7 K/uL   Basophils Relative 0  0 - 1 %   Basophils Absolute 0.0  0.0 - 0.1 K/uL  BASIC METABOLIC PANEL      Result Value Range   Sodium 136  135 - 145 mEq/L   Potassium 3.5  3.5 - 5.1 mEq/L   Chloride 99  96 - 112 mEq/L   CO2 21  19 - 32 mEq/L   Glucose, Bld 122 (*) 70 - 99 mg/dL   BUN 12  6 - 23 mg/dL   Creatinine, Ser 5.95  0.50 - 1.10 mg/dL   Calcium 63.8  8.4 -  10.5 mg/dL   GFR calc non Af Amer >90  >90 mL/min   GFR calc Af Amer >90  >90 mL/min  ETHANOL      Result Value Range   Alcohol, Ethyl (B) <11  0 - 11 mg/dL  URINALYSIS, ROUTINE W REFLEX MICROSCOPIC      Result Value Range   Color, Urine YELLOW  YELLOW   APPearance CLEAR  CLEAR   Specific Gravity, Urine 1.020  1.005 - 1.030   pH 7.0  5.0 - 8.0   Glucose, UA NEGATIVE  NEGATIVE mg/dL   Hgb urine dipstick TRACE (*) NEGATIVE   Bilirubin Urine SMALL (*) NEGATIVE   Ketones, ur >80 (*) NEGATIVE mg/dL   Protein, ur TRACE (*) NEGATIVE mg/dL   Urobilinogen, UA 1.0  0.0 - 1.0 mg/dL   Nitrite NEGATIVE  NEGATIVE   Leukocytes, UA LARGE (*) NEGATIVE  PREGNANCY, URINE      Result Value Range   Preg Test, Ur NEGATIVE  NEGATIVE  URINE MICROSCOPIC-ADD ON      Result Value Range   Squamous Epithelial / LPF MANY (*) RARE   WBC, UA 11-20  <3 WBC/hpf   RBC / HPF 3-6  <3 RBC/hpf   Bacteria, UA MANY (*) RARE   Urine-Other MUCOUS PRESENT    URINE RAPID DRUG SCREEN (HOSP PERFORMED)      Result Value Range   Opiates NONE DETECTED  NONE DETECTED   Cocaine NONE DETECTED  NONE DETECTED   Benzodiazepines NONE DETECTED  NONE DETECTED   Amphetamines NONE DETECTED  NONE DETECTED   Tetrahydrocannabinol NONE DETECTED  NONE DETECTED   Barbiturates NONE DETECTED  NONE DETECTED     2200:  [facility] requests Telepsych MD eval. Consult ordered. Holding orders written.      Laray Anger, DO 04/22/13 [phone]

## 2013-04-22 MED ORDER — ONDANSETRON HCL 4 MG PO TABS: 4.0000 mg | ORAL_TABLET | Freq: Three times a day (TID) | ORAL | Status: DC | PRN

## 2013-04-22 MED ORDER — NICOTINE 21 MG/24HR TD PT24: 21.0000 mg | MEDICATED_PATCH | Freq: Every day | TRANSDERMAL | Status: DC | PRN

## 2013-04-22 MED ORDER — ALUM & MAG HYDROXIDE-SIMETH 200-200-20 MG/5ML PO SUSP: 30.0000 mL | ORAL | Status: DC | PRN

## 2013-04-22 MED ORDER — IBUPROFEN 400 MG PO TABS: 400.0000 mg | ORAL_TABLET | Freq: Three times a day (TID) | ORAL | Status: DC | PRN

## 2013-04-22 MED ORDER — ACETAMINOPHEN 325 MG PO TABS: 650.0000 mg | ORAL_TABLET | ORAL | Status: DC | PRN

## 2013-04-22 MED ORDER — LORAZEPAM 1 MG PO TABS: 1.0000 mg | ORAL_TABLET | Freq: Three times a day (TID) | ORAL | Status: DC | PRN

## 2013-04-22 NOTE — ED Notes (Signed)
IVC recinded.  Pt discharged with instructions to follow up at [facility].

## 2013-04-22 NOTE — ED Notes (Signed)
Telepsych consult in progress

## 2013-04-23 LAB — URINE CULTURE

## 2014-08-14 ENCOUNTER — Encounter (HOSPITAL_COMMUNITY): Payer: Self-pay | Admitting: Emergency Medicine

## 2017-02-19 ENCOUNTER — Emergency Department (HOSPITAL_COMMUNITY): Payer: Medicaid Other

## 2017-02-19 ENCOUNTER — Emergency Department (HOSPITAL_COMMUNITY)
Admission: EM | Admit: 2017-02-19 | Discharge: 2017-02-19 | Disposition: A | Discharge status: Home / Self Care | Payer: Medicaid Other | Attending: Emergency Medicine | Admitting: Emergency Medicine

## 2017-02-19 ENCOUNTER — Encounter (HOSPITAL_COMMUNITY): Payer: Self-pay | Admitting: Emergency Medicine

## 2017-02-19 DIAGNOSIS — Y92009 Unspecified place in unspecified non-institutional (private) residence as the place of occurrence of the external cause: Secondary | ICD-10-CM | POA: Diagnosis not present

## 2017-02-19 DIAGNOSIS — Y939 Activity, unspecified: Secondary | ICD-10-CM | POA: Insufficient documentation

## 2017-02-19 DIAGNOSIS — I10 Essential (primary) hypertension: Secondary | ICD-10-CM | POA: Diagnosis not present

## 2017-02-19 DIAGNOSIS — Y999 Unspecified external cause status: Secondary | ICD-10-CM | POA: Diagnosis not present

## 2017-02-19 DIAGNOSIS — S8002XA Contusion of left knee, initial encounter: Secondary | ICD-10-CM | POA: Diagnosis not present

## 2017-02-19 DIAGNOSIS — Z79899 Other long term (current) drug therapy: Secondary | ICD-10-CM | POA: Diagnosis not present

## 2017-02-19 DIAGNOSIS — S8001XA Contusion of right knee, initial encounter: Secondary | ICD-10-CM | POA: Insufficient documentation

## 2017-02-19 DIAGNOSIS — S8992XA Unspecified injury of left lower leg, initial encounter: Secondary | ICD-10-CM | POA: Diagnosis present

## 2017-02-19 DIAGNOSIS — S8000XA Contusion of unspecified knee, initial encounter: Secondary | ICD-10-CM

## 2017-02-19 DIAGNOSIS — W109XXA Fall (on) (from) unspecified stairs and steps, initial encounter: Secondary | ICD-10-CM | POA: Diagnosis not present

## 2017-02-19 HISTORY — DX: Essential (primary) hypertension: I10

## 2017-02-19 NOTE — ED Provider Notes (Signed)
[facility] DEPT Provider Note   CSN: [id number] Arrival date & time: 02/19/17  1034     History   Chief Complaint Chief Complaint  Patient presents with  . Knee Pain    HPI CAMBRI PLOURDE is a 46 y.o. female.  The history is provided by the patient. No language interpreter was used.  Knee Pain   This is a new problem. The problem occurs constantly. The pain is present in the right knee and left knee. The quality of the pain is described as aching. The pain is moderate. She has tried nothing for the symptoms. The treatment provided no relief. There has been no history of extremity trauma.  Pt complains of pain to both knees after falling.    Past Medical History:  Diagnosis Date  . Anxiety    Panic attacks; somatization  . 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 Hypertension   . Ovarian cyst    cervical polyp  . Palpitations    possible bradycardia  . Somatization disorder     Patient Active Problem List   Diagnosis Date Noted  . 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 Hyperglycemia 06/21/2012  . Hyponatremia 06/21/2012  . QT prolongation with abnormal EKG 06/21/2012  . 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) 03/18/2012  . Hematoma 12/11/2011  . Swelling of joint of pelvic region or thigh 12/11/2011  . Laboratory test 12/01/2011  . Ovarian cyst   . Anxiety   . Palpitations     Past Surgical History:  Procedure Laterality Date  . None  05-22-12   hx of ovarian cysts    OB History    Gravida Para Term Preterm AB Living   3 3 3     3    SAB TAB Ectopic Multiple Live Births                   Home Medications    Prior to Admission medications   Medication Sig Start Date End Date Taking? Authorizing Provider  benztropine (COGENTIN) 1 MG tablet Take 2 mg by mouth at bedtime.    [provider]  buPROPion (WELLBUTRIN SR) 150 MG 12 hr tablet Take 150 mg by mouth 2 (two) times daily.    [provider]  clonazePAM (KLONOPIN) 1 MG tablet Take 1 mg by mouth 4 (four) times daily as needed for anxiety.     [provider]  potassium chloride (K-DUR) 10 MEQ tablet Take 20 mEq by mouth daily.     [provider]  RisperiDONE (RISPERDAL PO) Take 6 mg by mouth at bedtime.    [provider]  traZODone (DESYREL) 100 MG tablet Take 200 mg by mouth at bedtime.    [provider]    Family History Family History  Problem Relation Age of Onset  . Cancer Father   . Hypertension Mother   . Diabetes Mother   . Heart failure Other     Social History Social History  Substance Use Topics  . Smoking status: Never Smoker  . Smokeless tobacco: Never Used  . Alcohol use No     Allergies   Latex   Review of Systems Review of Systems  All other systems reviewed and are negative.    Physical Exam Updated Vital Signs BP (!) 132/92 (BP Location: Right Arm)   Pulse 98   Temp 98.6 F (37 C) (Oral)   Resp 20   Ht 5' 4"  (1.626 m)  Wt 86.2 kg   LMP 02/19/2017   SpO2 100%   BMI 32.61 kg/m   Physical Exam  Constitutional: She appears well-developed and well-nourished.  Musculoskeletal: She exhibits tenderness.  Tender bilat knees,  Pain with movement.  amblates with minimal difficulty   Neurological: She is alert.  Skin: Skin is warm.  Psychiatric: She has a normal mood and affect.  Nursing note and vitals reviewed.    ED Treatments / Results  Labs (all labs ordered are listed, but only abnormal results are displayed) Labs Reviewed - No data to display  EKG  EKG Interpretation None       Radiology Dg Knee Complete 4 Views Left  Result Date: 02/19/2017 CLINICAL DATA:  [place] [address] down stairs at home this morning. Left knee pain and swelling. Initial encounter. EXAM: LEFT KNEE - COMPLETE 4+ VIEW COMPARISON:  None. FINDINGS: No evidence of fracture, dislocation, or joint effusion. No evidence of arthropathy or other focal bone  abnormality. Soft tissues are unremarkable. IMPRESSION: Negative. Electronically Signed   By: Earle Gell M.D.   On: 02/19/2017 12:08   Dg Knee Complete 4 Views Right  Result Date: 02/19/2017 CLINICAL DATA:  Fall down steps at home this morning. Right knee pain and swelling. Initial encounter. EXAM: RIGHT KNEE - COMPLETE 4+ VIEW COMPARISON:  None. FINDINGS: No evidence of fracture, dislocation, or joint effusion. No evidence of arthropathy or other focal bone abnormality. Soft tissues are unremarkable. IMPRESSION: Negative. Electronically Signed   By: Earle Gell M.D.   On: 02/19/2017 12:08    Procedures Procedures (including critical care time)  Medications Ordered in ED Medications - No data to display   Initial Impression / Assessment and Plan / ED Course  I have reviewed the triage vital signs and the nursing notes.  Pertinent labs & imaging results that were available during my care of the patient were reviewed by me and considered in my medical decision making (see chart for details).       Final Clinical Impressions(s) / ED Diagnoses   Final diagnoses:  Contusion of knee, unspecified laterality, initial encounter    New Prescriptions New Prescriptions   No medications on file  An After Visit Summary was printed and given to the patient.   Fransico Meadow, PA-C 02/19/17 1243    Isla Pence, MD 02/19/17 [phone]

## 2017-02-19 NOTE — ED Triage Notes (Signed)
Patient states that she fell on both knees this morning. States knee pain and swelling. States her primary care provider told her to be seen in ED. Patient walking in lobby NAD.

## 2017-02-19 NOTE — ED Notes (Signed)
Pt was transported to [facility]

## 2017-02-19 NOTE — Discharge Instructions (Signed)
Return if any problems.  Ibuprofen for pain.

## 2017-04-07 ENCOUNTER — Other Ambulatory Visit (HOSPITAL_COMMUNITY): Payer: Self-pay | Admitting: Pulmonary Disease

## 2017-04-07 DIAGNOSIS — R609 Edema, unspecified: Secondary | ICD-10-CM

## 2017-04-08 ENCOUNTER — Ambulatory Visit (HOSPITAL_COMMUNITY): Payer: Medicaid Other

## 2017-04-13 ENCOUNTER — Ambulatory Visit (HOSPITAL_COMMUNITY)
Admission: RE | Admit: 2017-04-13 | Discharge: 2017-04-13 | Disposition: A | Discharge status: Home / Self Care | Payer: Medicaid Other | Source: Ambulatory Visit | Attending: Pulmonary Disease | Admitting: Pulmonary Disease

## 2017-04-13 DIAGNOSIS — R609 Edema, unspecified: Secondary | ICD-10-CM | POA: Diagnosis not present

## 2017-04-13 DIAGNOSIS — I358 Other nonrheumatic aortic valve disorders: Secondary | ICD-10-CM | POA: Diagnosis not present

## 2017-04-13 NOTE — Progress Notes (Signed)
*  PRELIMINARY RESULTS* Echocardiogram 2D Echocardiogram has been performed.  Leavy Cella 04/13/2017, 9:08 AM

## 2018-12-13 ENCOUNTER — Ambulatory Visit (INDEPENDENT_AMBULATORY_CARE_PROVIDER_SITE_OTHER): Payer: Self-pay | Admitting: Otolaryngology

## 2019-01-25 ENCOUNTER — Other Ambulatory Visit (HOSPITAL_COMMUNITY): Payer: Self-pay | Admitting: Pulmonary Disease

## 2019-01-25 DIAGNOSIS — M7989 Other specified soft tissue disorders: Secondary | ICD-10-CM

## 2019-01-28 ENCOUNTER — Encounter: Payer: Self-pay | Admitting: Adult Health

## 2019-03-10 ENCOUNTER — Ambulatory Visit
Admission: EM | Admit: 2019-03-10 | Discharge: 2019-03-10 | Disposition: A | Discharge status: Home / Self Care | Payer: Medicaid Other

## 2019-03-10 ENCOUNTER — Other Ambulatory Visit: Payer: Self-pay

## 2019-03-10 ENCOUNTER — Telehealth: Payer: Self-pay | Admitting: *Deleted

## 2019-03-10 DIAGNOSIS — R509 Fever, unspecified: Secondary | ICD-10-CM

## 2019-03-10 DIAGNOSIS — J392 Other diseases of pharynx: Secondary | ICD-10-CM | POA: Diagnosis not present

## 2019-03-10 DIAGNOSIS — Z20822 Contact with and (suspected) exposure to covid-19: Secondary | ICD-10-CM

## 2019-03-10 NOTE — Telephone Encounter (Signed)
Pt. Returned call to be scheduled for COVID 19 test.  Appt. Sched. At 9:45 AM, 5/29 @ the [facility]; advised to drive up to site, remain in car, and wear a mask to the appt.  Verb. Understanding.

## 2019-03-10 NOTE — ED Triage Notes (Signed)
Pt denies any symptoms

## 2019-03-10 NOTE — ED Triage Notes (Signed)
Pt was sent by dentist and had fever, dentist suggesting testing before she can be seen

## 2019-03-10 NOTE — ED Provider Notes (Signed)
[facility]   [id number] 03/10/19 Arrival Time: 7989  CC: fever, order for COVID testing  SUBJECTIVE: History from: patient.  Kayla [name] is a 48 y.o. female hx significant anxiety, delusional disorder, HTN, palpitations, and somatization disorder, who was sent over from [facility] [place] testing center for order for COVID order.  Was sent to COVID testing from PCP after she was told she had a "high fever" at her dentist office this morning.  Temperature in office was 99.2.  Denies sick exposure to COVID, flu or strep.  Denies recent travel.  Has NOT taken any antipyretics today.   Complains of throat irritation x 1 month that she attributes to yelling "at this guy that been messing" with her.  Denies fever, chills, nausea, vomiting, rhinorrhea, congestion, cough, chest pain, chest pressure, SOB, body aches, fatigue, abdominal pain, changes in bowel or bladder function.    ROS: As per HPI.  Past Medical History:  Diagnosis Date  . Anxiety    Panic attacks; somatization  . 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 Hypertension   . Ovarian cyst    cervical polyp  . Palpitations    possible bradycardia  . Somatization disorder    Past Surgical History:  Procedure Laterality Date  . None  05-22-12   hx of ovarian cysts   Allergies  Allergen Reactions  . Latex Itching   No current facility-administered medications on file prior to encounter.    Current Outpatient Medications on File Prior to Encounter  Medication Sig Dispense Refill  . potassium chloride (K-DUR) 10 MEQ tablet Take 20 mEq by mouth daily.     . benztropine (COGENTIN) 1 MG tablet Take 2 mg by mouth at bedtime.    Marland Kitchen buPROPion (WELLBUTRIN SR) 150 MG 12 hr tablet Take 150 mg by mouth 2 (two) times daily.    . clonazePAM (KLONOPIN) 1 MG tablet Take 1 mg by mouth 4 (four) times daily as needed for anxiety.     Marland Kitchen PRESCRIPTION MEDICATION Take 1 tablet by mouth daily. Blood pressure medicine    . RisperiDONE (RISPERDAL PO) Take 6  mg by mouth at bedtime.    . traZODone (DESYREL) 100 MG tablet Take 200 mg by mouth at bedtime.     Social History   Socioeconomic History  . Marital status: Divorced    Spouse name: Not on file  . Number of children: Not on file  . Years of education: Not on file  . Highest education level: Not on file  Occupational History  . Occupation: Unemployed  Social Needs  . Financial resource strain: Not on file  . Food insecurity:    Worry: Not on file    Inability: Not on file  . Transportation needs:    Medical: Not on file    Non-medical: Not on file  Tobacco Use  . Smoking status: Never Smoker  . Smokeless tobacco: Never Used  Substance and Sexual Activity  . Alcohol use: No  . Drug use: No  . Sexual activity: Never  Lifestyle  . Physical activity:    Days per week: Not on file    Minutes per session: Not on file  . Stress: Not on file  Relationships  . Social connections:    Talks on phone: Not on file    Gets together: Not on file    Attends religious service: Not on file    Active member of club or organization: Not on file    Attends meetings of clubs or  organizations: Not on file    Relationship status: Not on file  . Intimate partner violence:    Fear of current or ex partner: Not on file    Emotionally abused: Not on file    Physically abused: Not on file    Forced sexual activity: Not on file  Other Topics Concern  . Not on file  Social History Narrative   Divorced.  Lives with her mother and 3 children.     Family History  Problem Relation Age of Onset  . Cancer Father   . Hypertension Mother   . Diabetes Mother   . Heart failure Other     OBJECTIVE:  Vitals:   03/10/19 1258  BP: 139/74  Pulse: (!) 112  Resp: 18  Temp: 99.2 F (37.3 C)  SpO2: 97%    General appearance: alert; well-appearing, nontoxic; unkept appearance HEENT: NCAT; Ears: EACs clear, TMs pearly gray; Eyes: EOM grossly intact. Nose: no rhinorrhea without nasal flaring;  Throat: oropharynx clear, tonsils not enlarged or erythematous, uvula midline Neck: supple without LAD Lungs: CTA bilaterally without adventitious breath sounds; normal respiratory effort, no cough present Heart: tachycardia.  Radial pulses 2+ symmetrical bilaterally Skin: warm and dry; no obvious rashes Psychological: alert and cooperative; anxious mood and affect; pressure speech; suspicious of check-in process, does not understand why registration asked for her birthday three times, when her picture is in her chart.    ASSESSMENT & PLAN:  1. Throat irritation   2. Low grade fever     Oral temperature in office was 99.2 COVID testing ordered.  Outpatient center will contact you regarding your appointment  In the meantime: You should remain isolated in your home for 7 days from symptom onset AND greater than 72 hours after symptoms resolution (absence of fever without the use of fever-reducing medication and improvement in respiratory symptoms), whichever is longer Get plenty of rest and push fluids You may use OTC zyrtec and/or flonase as needed for congestion and/ or runny nose Take OTC tylenol as needed for fever, body aches, and/or chills Call or go to the ED if you have any new or worsening symptoms such as fever, worsening cough, shortness of breath, chest tightness, chest pain, turning blue, changes in mental status, etc...  Testing site: [address] [facility] ED  Reviewed expectations re: course of current medical issues. Questions answered. Outlined signs and symptoms indicating need for more acute intervention. Patient verbalized understanding. After Visit Summary given.          Lestine Box, PA-C 03/10/19 1323

## 2019-03-10 NOTE — Discharge Instructions (Addendum)
Oral temperature in office was 99.2 COVID testing ordered.  Outpatient center will contact you regarding your appointment  In the meantime: You should remain isolated in your home for 7 days from symptom onset AND greater than 72 hours after symptoms resolution (absence of fever without the use of fever-reducing medication and improvement in respiratory symptoms), whichever is longer Get plenty of rest and push fluids You may use OTC zyrtec and/or flonase as needed for congestion and/ or runny nose Take OTC tylenol as needed for fever, body aches, and/or chills Call or go to the ED if you have any new or worsening symptoms such as fever, worsening cough, shortness of breath, chest tightness, chest pain, turning blue, changes in mental status, etc...  Testing site: [address] [facility] ED

## 2019-03-11 ENCOUNTER — Other Ambulatory Visit: Payer: Self-pay

## 2019-03-11 DIAGNOSIS — Z20822 Contact with and (suspected) exposure to covid-19: Secondary | ICD-10-CM

## 2019-03-13 LAB — NOVEL CORONAVIRUS, NAA: SARS-CoV-2, NAA: NOT DETECTED

## 2019-08-22 ENCOUNTER — Encounter: Payer: Self-pay | Admitting: Gastroenterology

## 2019-08-23 ENCOUNTER — Telehealth: Payer: Self-pay | Admitting: Adult Health

## 2019-08-23 NOTE — Telephone Encounter (Signed)
Called patient regarding appointment scheduled in our office and advised to come alone to the visits, however, a support person, over age 47, may accompany her  to appointment if assistance is needed for safety or care concerns. Otherwise, support persons should remain outside until the visit is complete.   We ask if you have had any exposure to anyone suspected or confirmed of having COVID-19 or if you are experiencing any of the following, to call and reschedule your appointment: fever, cough, shortness of breath, muscle pain, diarrhea, rash, vomiting, abdominal pain, red eye, weakness, bruising, bleeding, joint pain, or a severe headache.   Please know we will ask you these questions or similar questions when you arrive for your appointment and again its how we are keeping everyone safe.    Also,to keep you safe, please use the provided hand sanitizer when you enter the office. We are asking everyone in the office to wear a mask to help prevent the spread of germs. If you have a mask of your own, please wear it to your appointment, if not, we are happy to provide one for you.  Thank you for understanding and your cooperation.    [facility] Staff

## 2019-08-24 ENCOUNTER — Encounter: Payer: Self-pay | Admitting: Adult Health

## 2019-08-24 ENCOUNTER — Ambulatory Visit: Payer: Medicaid Other | Admitting: Adult Health

## 2019-08-24 ENCOUNTER — Other Ambulatory Visit: Payer: Self-pay

## 2019-08-24 VITALS — BP 134/82 | HR 92 | Ht 64.0 in | Wt 216.0 lb

## 2019-08-24 DIAGNOSIS — R102 Pelvic and perineal pain unspecified side: Secondary | ICD-10-CM | POA: Insufficient documentation

## 2019-08-24 DIAGNOSIS — M25471 Effusion, right ankle: Secondary | ICD-10-CM | POA: Diagnosis not present

## 2019-08-24 DIAGNOSIS — M25472 Effusion, left ankle: Secondary | ICD-10-CM

## 2019-08-24 DIAGNOSIS — N920 Excessive and frequent menstruation with regular cycle: Secondary | ICD-10-CM | POA: Diagnosis not present

## 2019-08-24 DIAGNOSIS — R140 Abdominal distension (gaseous): Secondary | ICD-10-CM | POA: Insufficient documentation

## 2019-08-24 NOTE — Progress Notes (Addendum)
Patient ID: Kayla [name], female   DOB: [birth year], 48 y.o.   MRN: [id number] History of Present Illness: Kayla [name] is a 48 year old white female, divorced, G3P3, referred by Dr Luan Pulling for pelvic pain, was seen in ER at [facility], and told had gallstones and kidney stones.Has of ovarian cyst.Has not had pap in over 10 years PCP is Dr Luan Pulling    Current Medications, Allergies, Past Medical History, Past Surgical History, Family History and Social History were reviewed in [facility] record.     Review of Systems: +pelvic pain +bloating +heavy periods +abdomianl pain, has gallstones and kidney stones  +Swelling in ankles Not having sex    Physical Exam:BP 134/82 (BP Location: Left Arm, Patient Position: Sitting, Cuff Size: Large)   Pulse 92   Ht 5' 4"  (1.626 m)   Wt 216 lb (98 kg)   LMP 08/18/2019 (Exact Date)   BMI 37.08 kg/m  General:  Well developed, well nourished, no acute distress Skin:  Warm and dry Neck:  Midline trachea, normal thyroid, good ROM, no lymphadenopathy Lungs; Clear to auscultation bilaterally Cardiovascular: Regular rate and rhythm Abdomen:  Soft,+ tender in all 4 quadrants, no hepatosplenomegaly, no waves noted Pelvic:  External genitalia is normal in appearance, no lesions.  The vagina is normal in appearance. Urethra has no lesions or masses. The cervix is bulbous,slightly everted at os.No CMT  Uterus is felt to be normal size, shape, and contour.  No adnexal masses, + tenderness noted.Bladder is non tender, no masses felt. Extremities/musculoskeletal:  No varicosities noted, no clubbing or cyanosis, and non pitting edema in BLE to almost the knee Psych: alert and cooperative,seems happy but is overly chatty Fall risk is low PHQ 2 score is 0. Co exam with Weyman Croon FNP student. Pt got upset said Ariel was looking into her eyes and that made her nervous, she did tell me she was bipolar and things upset her, she has been seen at  [facility] but I don't think she takes meds daily. We did apologize, did not intend for her to be nervous, then she tells me she had dog bite years ago and had white in her mouth and her dad had white in his mouth before he died and she took too may potassium, and was then healed by Verlin Dike.     Impression and Plan: 1. Pelvic pain Will get GYN [place] 11/16 Get pap and physical on 11/19 and go over [place]   2. Bloating Will request records from [facility]  3. Swelling of both ankles  4. Menorrhagia with regular cycle

## 2019-08-26 ENCOUNTER — Telehealth: Payer: Self-pay | Admitting: Obstetrics & Gynecology

## 2019-08-26 NOTE — Telephone Encounter (Signed)
Called patient regarding appointment scheduled in our office and advised to come alone to the visits, however, a support person, over age 47, may accompany her  to appointment if assistance is needed for safety or care concerns. Otherwise, support persons should remain outside until the visit is complete.   We ask if you have had any exposure to anyone suspected or confirmed of having COVID-19 or if you are experiencing any of the following, to call and reschedule your appointment: fever, cough, shortness of breath, muscle pain, diarrhea, rash, vomiting, abdominal pain, red eye, weakness, bruising, bleeding, joint pain, or a severe headache.   Please know we will ask you these questions or similar questions when you arrive for your appointment and again its how we are keeping everyone safe.    Also,to keep you safe, please use the provided hand sanitizer when you enter the office. We are asking everyone in the office to wear a mask to help prevent the spread of germs. If you have a mask of your own, please wear it to your appointment, if not, we are happy to provide one for you.  Thank you for understanding and your cooperation.    [facility] Staff

## 2019-08-29 ENCOUNTER — Other Ambulatory Visit: Payer: Self-pay

## 2019-08-29 ENCOUNTER — Ambulatory Visit: Payer: Medicaid Other

## 2019-08-29 DIAGNOSIS — R140 Abdominal distension (gaseous): Secondary | ICD-10-CM

## 2019-08-29 DIAGNOSIS — R102 Pelvic and perineal pain: Secondary | ICD-10-CM | POA: Diagnosis not present

## 2019-08-29 NOTE — Progress Notes (Signed)
PELVIC US TA/TV: heterogeneous anteverted uterus with mult.fibroids,(#1) posterior subserosal fibroid 2.6 x 3.4 x 1.9 cm,(#2) anterior right intramural fibroid 2.5 x 1.6 x 1.7 cm,mult small simple nabothian cysts,1.9 x .8 x 1.9 cm echogenic mass with a feeder vessel within the endometrium (?polyp),normal ovaries,no free fluid,some left adnexal discomfort during ultrasound

## 2019-08-31 ENCOUNTER — Telehealth: Payer: Self-pay | Admitting: Adult Health

## 2019-08-31 NOTE — Telephone Encounter (Signed)
Called patient regarding appointment scheduled in our office and advised to come alone to the visits, however, a support person, over age 47, may accompany her  to appointment if assistance is needed for safety or care concerns. Otherwise, support persons should remain outside until the visit is complete.   We ask if you have had any exposure to anyone suspected or confirmed of having COVID-19 or if you are experiencing any of the following, to call and reschedule your appointment: fever, cough, shortness of breath, muscle pain, diarrhea, rash, vomiting, abdominal pain, red eye, weakness, bruising, bleeding, joint pain, or a severe headache.   Please know we will ask you these questions or similar questions when you arrive for your appointment and again its how we are keeping everyone safe.    Also,to keep you safe, please use the provided hand sanitizer when you enter the office. We are asking everyone in the office to wear a mask to help prevent the spread of germs. If you have a mask of your own, please wear it to your appointment, if not, we are happy to provide one for you.  Thank you for understanding and your cooperation.    [facility] Staff

## 2019-09-01 ENCOUNTER — Other Ambulatory Visit: Payer: Medicaid Other | Admitting: Adult Health

## 2019-09-01 ENCOUNTER — Encounter: Payer: Self-pay | Admitting: *Deleted

## 2019-09-01 ENCOUNTER — Telehealth: Payer: Self-pay | Admitting: *Deleted

## 2019-09-01 DIAGNOSIS — N840 Polyp of corpus uteri: Secondary | ICD-10-CM | POA: Insufficient documentation

## 2019-09-01 DIAGNOSIS — D219 Benign neoplasm of connective and other soft tissue, unspecified: Secondary | ICD-10-CM | POA: Insufficient documentation

## 2019-09-01 HISTORY — DX: Polyp of corpus uteri: N84.0

## 2019-09-01 HISTORY — DX: Benign neoplasm of connective and other soft tissue, unspecified: D21.9

## 2019-09-01 NOTE — Telephone Encounter (Signed)
Patient called requesting results.

## 2019-09-01 NOTE — Telephone Encounter (Signed)
Pt aware that US showed fibroids and polyp, make appt with Dr Glo Herring to discuss removing polyp

## 2019-09-15 ENCOUNTER — Ambulatory Visit: Payer: Medicaid Other | Admitting: Nurse Practitioner
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
## 2019-09-15 NOTE — Progress Notes (Deleted)
Primary Care Physician:  Sinda Du, MD Primary Gastroenterologist:  Dr.   Rayne Du chief complaint on file.   HPI:   Kayla [name] is a 48 y.o. female who presents on referral from primary care for abdominal pain and bloating.  Reviewed information provided with referral including ***.   Has seen OB/GYN for pelvic pain and at that time noted seen in the ER at [facility] for abdominal pain/pelvic pain and told she has gallstones and kidney stones.  Also has ovarian cyst.  Exam they noted tenderness all four quadrants.  Recommended GYN [place]. This was completed 08/29/2019 and found anteverted uterus of normal size with thickened endometrial stripe with suspected endometrial polyp with a feeder vessel seen in the endometrium.  Recommended removal of endometrial polyp with Dr. Glo Herring.  No history of colonoscopy or endoscopy in our system.  Today she states   Past Medical History:  Diagnosis Date  . Anxiety    Panic attacks; somatization  . 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 Endometrial polyp 09/01/2019   Pt aware, make appt with Dr Glo Herring,   . Fibroids 09/01/2019  . Hypertension   . Ovarian cyst    cervical polyp  . Palpitations    possible bradycardia  . Somatization disorder     Past Surgical History:  Procedure Laterality Date  . None  05-22-12   hx of ovarian cysts    Current Outpatient Medications  Medication Sig Dispense Refill  . benztropine (COGENTIN) 1 MG tablet Take 2 mg by mouth at bedtime.    Marland Kitchen buPROPion (WELLBUTRIN SR) 150 MG 12 hr tablet Take 150 mg by mouth 2 (two) times daily.    . clonazePAM (KLONOPIN) 1 MG tablet Take 1 mg by mouth 4 (four) times daily as needed for anxiety.     . ferrous sulfate 325 (65 FE) MG EC tablet Take 325 mg by mouth 3 (three) times daily with meals.    . potassium chloride (K-DUR) 10 MEQ tablet Take 20 mEq by mouth daily.     Marland Kitchen PRESCRIPTION MEDICATION Take 1 tablet by mouth daily. Blood pressure medicine    . RisperiDONE  (RISPERDAL PO) Take 6 mg by mouth at bedtime.    . traZODone (DESYREL) 100 MG tablet Take 200 mg by mouth at bedtime.     No current facility-administered medications for this visit.     Allergies as of 09/15/2019 - Review Complete 08/24/2019  Allergen Reaction Noted  . Latex Itching 05/19/2012    Family History  Problem Relation Age of Onset  . Cancer Father   . Hypertension Mother   . Diabetes Mother   . Heart failure Other     Social History   Socioeconomic History  . Marital status: Divorced    Spouse name: Not on file  . Number of children: Not on file  . Years of education: Not on file  . Highest education level: Not on file  Occupational History  . Occupation: Unemployed  Social Needs  . Financial resource strain: Not on file  . Food insecurity    Worry: Not on file    Inability: Not on file  . Transportation needs    Medical: Not on file    Non-medical: Not on file  Tobacco Use  . Smoking status: Never Smoker  . Smokeless tobacco: Never Used  Substance and Sexual Activity  . Alcohol use: No  . Drug use: No  . Sexual activity: Not Currently  Lifestyle  . Physical  activity    Days per week: Not on file    Minutes per session: Not on file  . Stress: Not on file  Relationships  . Social Herbalist on phone: Not on file    Gets together: Not on file    Attends religious service: Not on file    Active member of club or organization: Not on file    Attends meetings of clubs or organizations: Not on file    Relationship status: Not on file  . Intimate partner violence    Fear of current or ex partner: Not on file    Emotionally abused: Not on file    Physically abused: Not on file    Forced sexual activity: Not on file  Other Topics Concern  . Not on file  Social History Narrative   Divorced.  Lives with her mother and 3 children.      Review of Systems: General: Negative for anorexia, weight loss, fever, chills, fatigue, weakness. Eyes:  Negative for vision changes.  ENT: Negative for hoarseness, difficulty swallowing , nasal congestion. CV: Negative for chest pain, angina, palpitations, dyspnea on exertion, peripheral edema.  Respiratory: Negative for dyspnea at rest, dyspnea on exertion, cough, sputum, wheezing.  GI: See history of present illness. GU:  Negative for dysuria, hematuria, urinary incontinence, urinary frequency, nocturnal urination.  MS: Negative for joint pain, low back pain.  Derm: Negative for rash or itching.  Neuro: Negative for weakness, abnormal sensation, seizure, frequent headaches, memory loss, confusion.  Psych: Negative for anxiety, depression, suicidal ideation, hallucinations.  Endo: Negative for unusual weight change.  Heme: Negative for bruising or bleeding. Allergy: Negative for rash or hives.    Physical Exam: LMP 08/18/2019 (Exact Date)  General:   Alert and oriented. Pleasant and cooperative. Well-nourished and well-developed.  Head:  Normocephalic and atraumatic. Eyes:  Without icterus, sclera clear and conjunctiva pink.  Ears:  Normal auditory acuity. Mouth:  No deformity or lesions, oral mucosa pink.  Throat/Neck:  Supple, without mass or thyromegaly. Cardiovascular:  S1, S2 present without murmurs appreciated. Normal pulses noted. Extremities without clubbing or edema. Respiratory:  Clear to auscultation bilaterally. No wheezes, rales, or rhonchi. No distress.  Gastrointestinal:  +BS, soft, non-tender and non-distended. No HSM noted. No guarding or rebound. No masses appreciated.  Rectal:  Deferred  Musculoskalatal:  Symmetrical without gross deformities. Normal posture. Skin:  Intact without significant lesions or rashes. Neurologic:  Alert and oriented x4;  grossly normal neurologically. Psych:  Alert and cooperative. Normal mood and affect. Heme/Lymph/Immune: No significant cervical adenopathy. No excessive bruising noted.    09/15/2019 7:40 AM   Disclaimer: This note  was dictated with voice recognition software. Similar sounding words can inadvertently be transcribed and may not be corrected upon review.

## 2019-09-16 ENCOUNTER — Telehealth: Payer: Self-pay

## 2019-09-16 ENCOUNTER — Encounter: Payer: Self-pay | Admitting: Gastroenterology

## 2019-09-16 ENCOUNTER — Other Ambulatory Visit: Payer: Self-pay

## 2019-09-16 ENCOUNTER — Ambulatory Visit: Payer: Medicaid Other | Admitting: Gastroenterology

## 2019-09-16 VITALS — BP 121/79 | HR 81 | Temp 96.8°F | Ht 64.0 in | Wt 215.8 lb

## 2019-09-16 DIAGNOSIS — R109 Unspecified abdominal pain: Secondary | ICD-10-CM | POA: Diagnosis not present

## 2019-09-16 DIAGNOSIS — K219 Gastro-esophageal reflux disease without esophagitis: Secondary | ICD-10-CM | POA: Insufficient documentation

## 2019-09-16 DIAGNOSIS — R1310 Dysphagia, unspecified: Secondary | ICD-10-CM | POA: Insufficient documentation

## 2019-09-16 DIAGNOSIS — R140 Abdominal distension (gaseous): Secondary | ICD-10-CM

## 2019-09-16 MED ORDER — PANTOPRAZOLE SODIUM 40 MG PO TBEC: 40.0000 mg | DELAYED_RELEASE_TABLET | Freq: Every day | ORAL | 5 refills | Status: DC
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
## 2019-09-16 NOTE — Telephone Encounter (Signed)
I called [facility] and spoke to [place]:  Pt is taking:   Furosemide 20 qd  Diltiazem 180 mg qd  She said they are filling these other meds monthly also:   Cogentin 1 mg  Qd  Trazadone 150 mg qhs  Citalopram 40 mg qd  Haloperidol 5 mg qhs

## 2019-09-16 NOTE — Assessment & Plan Note (Signed)
3 episodes of dysphagia in the last few months with feeling like she couldn't get rice or meat to go down. Also with intermittent burning/hot sensation in her throat daily. Worse after meals. I suspect patient likely has GERD which may be causing esophagitis vs development of esophageal web, ring, or stricture. Patient doesn't want EGD at this time. Could pursue BPE but patient would rather start PPI and monitor dysphagia for now.   Start Protonix 40 mg daily.  Discussed GERD diet and handout provided.  Continue to monitor dysphagia symptoms.  Follow-up in 3 months. Advised she call us if she has any worsening of her symptoms in the meantime.

## 2019-09-16 NOTE — Assessment & Plan Note (Addendum)
48 y.o. female with past medical history significant for anxiety, depression, delusional disorder, somatization disorder, HTN, uterine fibroids, and new endometrial polyp who reports abdominal bloating/distension. Denies sensation of gaseous distension. Feels like swelling is in her entire body. On Lasix 20 mg daily. Doesn't think this is helping. She wonders if it is from her being over weight. Recently see at [facility] in October 2020 for abdominal pain.  She reports distention was much worse at that time.  She had CT scan and apparently had gallstones and kidney stones.  Abdominal pain resolved within 24 hours.  Now only with abdominal pain if pressing hard on her abdomen.  No other signs or symptoms of liver disease. On exam, abdomen is protuberant.  Patient was initially guarding and flinching with light palpation; however, once I was able to get her distracted, her abdomen was soft, and she only had tenderness to deep palpation in the right upper quadrant, closer to the epigastric area. No pitting edema in her extremities appreciated although patient reports she feels swollen.   Etiology of subjective abdominal bloating/swelling is not clear and no significant fluid overload appreciated on exam. Doubt cirrhosis etiology with ascites but will need to rule this out. Do not suspect cardiac etiology with ECHO on file from 2018 with EF normal at 55%-60%. Requesting records from [facility] to review CT and labs. Advised she continue working on weight loss. Starting her on Protonix 40 mg daily for suspected GERD as described above.  Follow-up in 3 months.

## 2019-09-16 NOTE — Telephone Encounter (Signed)
Noted. I will update her med list.

## 2019-09-16 NOTE — Patient Instructions (Addendum)
I am requesting records from [facility] to review.  I am sending in a prescription for Protonix 40 mg.  You should take this daily 30 minutes before breakfast.  Please follow a GERD diet.  Avoid fried, fatty, greasy, spicy, and citrus foods.  Avoid soda, carbonated beverages, caffeine, and chocolate.  Do not eat within 3 hours of laying down.  Try propping the head of your bed up on bricks or wood to create a 6 inch incline.  Continue working on weight loss.  For bowel regularity, you may use MiraLAX 1 capful (17g) daily with a goal of a bowel movement every day to every other day.  We will plan to follow-up with you in 3 months.  Should you have any questions or concerns prior or acute change in your symptoms, do not hesitate to call.  Aliene Altes, PA-C [facility]    Food Choices for Gastroesophageal Reflux Disease, Adult When you have gastroesophageal reflux disease (GERD), the foods you eat and your eating habits are very important. Choosing the right foods can help ease your discomfort. Think about working with a nutrition specialist (dietitian) to help you make good choices. What are tips for following this plan?  Meals  Choose healthy foods that are low in fat, such as fruits, vegetables, whole grains, low-fat dairy products, and lean meat, fish, and poultry.  Eat small meals often instead of 3 large meals a day. Eat your meals slowly, and in a place where you are relaxed. Avoid bending over or lying down until 2-3 hours after eating.  Avoid eating meals 2-3 hours before bed.  Avoid drinking a lot of liquid with meals.  Cook foods using methods other than frying. Bake, grill, or broil food instead.  Avoid or limit: ? Chocolate. ? Peppermint or spearmint. ? Alcohol. ? Pepper. ? Black and decaffeinated coffee. ? Black and decaffeinated tea. ? Bubbly (carbonated) soft drinks. ? Caffeinated energy drinks and soft drinks.  Limit high-fat foods such  as: ? Fatty meat or fried foods. ? Whole milk, cream, butter, or ice cream. ? Nuts and nut butters. ? Pastries, donuts, and sweets made with butter or shortening.  Avoid foods that cause symptoms. These foods may be different for everyone. Common foods that cause symptoms include: ? Tomatoes. ? Oranges, lemons, and limes. ? Peppers. ? Spicy food. ? Onions and garlic. ? Vinegar. Lifestyle  Maintain a healthy weight. Ask your doctor what weight is healthy for you. If you need to lose weight, work with your doctor to do so safely.  Exercise for at least 30 minutes for 5 or more days each week, or as told by your doctor.  Wear loose-fitting clothes.  Do not smoke. If you need help quitting, ask your doctor.  Sleep with the head of your bed higher than your feet. Use a wedge under the mattress or blocks under the bed frame to raise the head of the bed. Summary  When you have gastroesophageal reflux disease (GERD), food and lifestyle choices are very important in easing your symptoms.  Eat small meals often instead of 3 large meals a day. Eat your meals slowly, and in a place where you are relaxed.  Limit high-fat foods such as fatty meat or fried foods.  Avoid bending over or lying down until 2-3 hours after eating.  Avoid peppermint and spearmint, caffeine, alcohol, and chocolate. This information is not intended to replace advice given to you by your health care provider. Make sure you  discuss any questions you have with your health care provider. Document Released: 03/30/2012 Document Revised: 01/20/2019 Document Reviewed: 11/04/2016 Elsevier Patient Education  2020 [employer].

## 2019-09-16 NOTE — Assessment & Plan Note (Addendum)
Patient reports intermittent burning/hot sensation in her throat occurring on and off daily for the last few months. Worse after meals. Suspect she has GERD. Also with dysphagia about 3 times over the last few months as described below. Denies NSAIDs. No brbpr or melena. Exam with tenderness to deep palpation in the RUQ area more towards the epigastric area. Query whether she may have gastritis/duodenitis. Recently seen at [facility] for abdominal pain. Apparently CT with gallstones and kidney stones. Current symptoms are not classically biliary. Do not suspect need for cholecystectomy at this time.   Start Protonix 40 mg daily 30 minutes before breakfast.  Follow-up in 3 months. Call if symptoms change or worsen prior.

## 2019-09-19 ENCOUNTER — Telehealth: Payer: Self-pay | Admitting: Gastroenterology

## 2019-09-19 NOTE — Telephone Encounter (Signed)
Received and reviewed records from [facility].    CT abdomen and pelvis with contrast on 08/02/2019 for abdominal pain and distention.  Impression: 1 cholelithiasis.  No gallbladder wall thickening.  2 nonobstructing 2 mm calculus lower pole right kidney.  No hydronephrosis or ureteral calculus on either side.  Urinary bladder wall thickness normal.  3 no bowel obstruction.  No abscess in the abdomen or pelvis.  Appendix appears normal. Reviewed all other findings.  No focal liver lesions identified.  No abscess or ascites in the abdomen or pelvis.  No evidence of bowel wall or mesenteric thickening.   Labs on 08/02/2019 CMP: Glucose 93, creatinine 0.64, calcium 8.7, albumin 3.8, alk phos 97 (H), AST 12, ALT 11, total bilirubin 0.2, sodium 137, potassium 3.6, chloride 101 Lipase 30 CBC: WBC 8.7, hemoglobin 9.5 (L), hematocrit 32.6 (L), MCV 78.0 (L), MCH 22.7 (L), MCHC 29.1 (L), platelet count 393.  Doris: Please let patient know I have received and reviewed her records from [facility].   No obvious explanation for her abdominal bloating.  No ascites (fluid in her abdomen) noted.  For bloating bloating, I recommend she drink enough water to keep her urine pale yellow to clear, avoid items that cause bloating including broccoli, cabbage, cauliflower, and baked beans, carbonated drinks, hard candy, and chewing gum.  Continue to work on weight loss and try to exercise daily. She could also try adding a daily probiotic.  Align, digestive advantage, or Hardin Negus colon health are good choices.  Her hemoglobin was low at 9.5 with microcytic indices. Does she have history of anemia? Iron was listed on her med list at last visit, but patient reported not taking. I would like for her to complete an iron panel with ferritin.  Could you please arrange this?

## 2019-09-20 ENCOUNTER — Other Ambulatory Visit: Payer: Self-pay

## 2019-09-20 DIAGNOSIS — D649 Anemia, unspecified: Secondary | ICD-10-CM

## 2019-09-20 NOTE — Telephone Encounter (Signed)
Noted. Further recommendations to follow results.

## 2019-09-20 NOTE — Telephone Encounter (Signed)
PT is aware of results and plan. She will get a probiotic to start daily. She said she has been told that she has anemia and has been taking OTC iron but has run out. She was taking the flint stone complete supplements with iron.  She is aware to go to the lab for iron studies.

## 2019-09-20 NOTE — Progress Notes (Signed)
fer

## 2019-10-19 ENCOUNTER — Telehealth: Payer: Self-pay | Admitting: *Deleted

## 2019-10-19 LAB — IRON,TIBC AND FERRITIN PANEL
%SAT: 6 % (calc) — ABNORMAL LOW (ref 16–45)
Ferritin: 6 ng/mL — ABNORMAL LOW (ref 16–232)
Iron: 23 ug/dL — ABNORMAL LOW (ref 40–190)
TIBC: 407 mcg/dL (calc) (ref 250–450)

## 2019-10-19 NOTE — Telephone Encounter (Signed)
Pt advised JAG recommends she see Dr. Glo Herring. Pt voiced understanding and appt was scheduled for 1/14 @ 9:10 am. [employer]

## 2019-10-19 NOTE — Telephone Encounter (Signed)
Patient called stating [facility] saw her a month ago and is now wanting to do a colonoscopy and endoscopy on her. She had blood work drawn 10/18/2019 and was told Ferritin was 6 and iron was 23. Also, Anderson Malta told her she had cervical polyps and she needed to schedule surgery for this. Wants to know if this is cancerous. Would like all this information relayed to Dr. Glo Herring or Anderson Malta. Patient then calls back and states period is lasting 6 or 7 days.

## 2019-10-24 ENCOUNTER — Other Ambulatory Visit: Payer: Self-pay

## 2019-10-24 DIAGNOSIS — R1310 Dysphagia, unspecified: Secondary | ICD-10-CM

## 2019-10-24 DIAGNOSIS — D509 Iron deficiency anemia, unspecified: Secondary | ICD-10-CM

## 2019-10-24 MED ORDER — CLENPIQ 10-3.5-12 MG-GM -GM/160ML PO SOLN: 1.0000 | Freq: Once | ORAL | 0 refills | Status: AC

## 2019-10-26 ENCOUNTER — Telehealth: Payer: Self-pay | Admitting: Obstetrics and Gynecology

## 2019-10-26 NOTE — Telephone Encounter (Signed)
Called patient regarding appointment scheduled in our office and advised to come alone to the visit, however, a support person, over age 48, may accompany her to appointment if assistance is needed for safety or care concerns. Otherwise, support persons should remain outside until the visit is complete.   Prescreen questions asked: 1. Any of the following symptoms of COVID such as chills, fever, cough, shortness of breath, muscle pain, diarrhea, rash, vomiting, abdominal pain, red eye, weakness, bruising, bleeding, joint pain, loss of taste or smell, a severe headache, sore throat, fatigue 2. Any exposure to anyone suspected or confirmed of having COVID-19 3. Awaiting test results for COVID-19  Also,to keep you safe, please use the provided hand sanitizer when you enter the office. We are asking everyone in the office to wear a mask to help prevent the spread of germs. If you have a mask of your own, please wear it to your appointment, if not, we are happy to provide one for you.  Thank you for understanding and your cooperation.    [facility] Staff

## 2019-10-27 ENCOUNTER — Other Ambulatory Visit: Payer: Self-pay

## 2019-10-27 ENCOUNTER — Ambulatory Visit: Payer: Medicaid Other | Admitting: Obstetrics and Gynecology

## 2019-10-27 ENCOUNTER — Other Ambulatory Visit (HOSPITAL_COMMUNITY)
Admission: RE | Admit: 2019-10-27 | Discharge: 2019-10-27 | Disposition: A | Discharge status: Home / Self Care | Payer: Medicaid Other | Source: Ambulatory Visit | Attending: Obstetrics and Gynecology | Admitting: Obstetrics and Gynecology

## 2019-10-27 VITALS — BP 130/75 | HR 89 | Ht 64.0 in | Wt 217.6 lb

## 2019-10-27 DIAGNOSIS — Z124 Encounter for screening for malignant neoplasm of cervix: Secondary | ICD-10-CM | POA: Diagnosis not present

## 2019-10-27 DIAGNOSIS — N840 Polyp of corpus uteri: Secondary | ICD-10-CM | POA: Diagnosis not present

## 2019-10-27 MED ORDER — NYSTATIN-TRIAMCINOLONE 100000-0.1 UNIT/GM-% EX OINT: 1.0000 "application " | TOPICAL_OINTMENT | Freq: Two times a day (BID) | CUTANEOUS | 99 refills | Status: DC

## 2019-10-27 NOTE — Progress Notes (Signed)
Patient ID: Kayla [name], female   DOB: [birth year], 49 y.o.   MRN: [id number]    [facility] Visit  @DATE @            Patient name: Kayla [name] MRN [id number]  Date of birth: 15-Dec-1970  CC & HPI:  Kayla [name] is a 49 y.o. female presenting today for endometrial polyps. She had an [place] completed on 08/29/2019 which revealed a "thickened endometrial stripe with suspected endometrial polyp, with feeder vessel seen in endometrium." She is concerned that the polyp is cancerous. She also notes that her labia majora have been swollen and slightly tender with palpation over the past 2 months.   The pt had blood work completed on 10/18/2019 and was told that her ferritin is 6 and her iron is 23. Because of this, her GI wants to do an endoscopy to check for internal bleeding. She has not had a rectal exam. She takes PO iron 10 mg daily because she is nervous about taking a higher dose.  The pt has not been sexually active since her partner cheated on her. The patient denies fever, chills or any other symptoms or complaints at this time.   ROS:  ROS  + swollen external genitalia + endometrial  polyps - fever - chills  All systems are negative except as noted in the HPI and PMH.   Pertinent History Reviewed:   Reviewed: Significant for cervical polyps Medical         Past Medical History:  Diagnosis Date  . Anxiety    Panic attacks; somatization  . Bipolar disorder ([place])   . 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 Endometrial polyp 09/01/2019   Pt aware, make appt with Dr Glo Herring,   . Fibroids 09/01/2019  . Hypertension   . Ovarian cyst    cervical polyp  . Palpitations    possible bradycardia  . Somatization disorder                               Surgical Hx:    Past Surgical History:  Procedure Laterality Date  . None  05-22-12   hx of ovarian cysts   Medications: Reviewed & Updated - see associated section                       Current Outpatient Medications:  .   benztropine (COGENTIN) 1 MG tablet, Take 2 mg by mouth at bedtime., Disp: , Rfl:  .  citalopram (CELEXA) 40 MG tablet, Take 40 mg by mouth daily., Disp: , Rfl:  .  diltiazem (TIAZAC) 180 MG 24 hr capsule, Take 180 mg by mouth daily., Disp: , Rfl:  .  furosemide (LASIX) 20 MG tablet, Take 20 mg by mouth daily., Disp: , Rfl:  .  haloperidol (HALDOL) 5 MG tablet, Take 5 mg by mouth at bedtime., Disp: , Rfl:  .  Multiple Vitamin (MULTIVITAMIN) capsule, Take 1 capsule by mouth daily. Takes a multivitamin with iron daily, Disp: , Rfl:  .  pantoprazole (PROTONIX) 40 MG tablet, Take 1 tablet (40 mg total) by mouth daily before breakfast., Disp: 30 tablet, Rfl: 5 .  traZODone (DESYREL) 100 MG tablet, Take 200 mg by mouth at bedtime., Disp: , Rfl:    Social History: Reviewed -  reports that she has never smoked. She has never used smokeless tobacco.  Objective Findings:  Vitals: Blood pressure 130/75, pulse 89,  height 5' 4"  (1.626 m), weight 217 lb 9.6 oz (98.7 kg).  PHYSICAL EXAMINATION General appearance - alert, well appearing, and in no distress, oriented to person, place, and time and overweight Mental status - alert, oriented to person, place, and time, normal behavior, speech, dress, motor activity, and thought processes, affect appropriate to mood  PELVIC External Genitalia: normal Cervix - slightly increased in size Uterus - anterior, normal size Rectal - normal rectal, no masses, guaiac negative stool obtained  Assessment & Plan:   A:  1. Endometrial polyp 2. Normal external genitalia 3. Mild iron deficiency with normal HGB and negative hemoccult 4. Obesity Body mass index is 37.35 kg/m.  P:  1. GC/CHL completed today 2. Pap smear completed today 3. Continue PO irons daily 4. Offered endometrial Bx for polyp in 2-4 wk  By signing my name below, I, De Burrs, attest that this documentation has been prepared under the direction and in the presence of Jonnie Kind,  MD. Electronically Signed: De Burrs, Medical Scribe. 10/27/19. 10:03 AM.  I personally performed the services described in this documentation, which was SCRIBED in my presence. The recorded information has been reviewed and considered accurate. It has been edited as necessary during review. Jonnie Kind, MD  The provider spent over 25 minutes with the visit , including previsit review, and documentation,with >than 50% spent in counseling and coordination of care.

## 2019-10-27 NOTE — Addendum Note (Signed)
Addended by: Jonnie Kind on: 10/27/2019 02:35 PM   Modules accepted: Orders

## 2019-11-01 LAB — CYTOLOGY - PAP
Chlamydia: NEGATIVE
Comment: NEGATIVE
Comment: NEGATIVE
Comment: NORMAL
Diagnosis: UNDETERMINED — AB
High risk HPV: NEGATIVE
Neisseria Gonorrhea: NEGATIVE

## 2019-11-01 NOTE — Progress Notes (Signed)
ASCUS but negative HPV. Advised to 3 yr followup based on ASCCP guidelines showing 0.4% chance of CIN III within 5 yrs. Will change f/u appt to 3 yr. And notify patient.

## 2019-11-02 ENCOUNTER — Telehealth: Payer: Self-pay | Admitting: *Deleted

## 2019-11-02 NOTE — Telephone Encounter (Signed)
Patient informed pap showed ASCUS but negative HPV. Advised to 3 yr followup based on ASCCP guidelines showing 0.4% chance of CIN III within 5 yrs. Pt with several questions. All answered with patient verbalizing understanding.  Advised she could discuss further with Dr Glo Herring at her next visit.

## 2019-11-02 NOTE — Telephone Encounter (Signed)
Patient requesting pap smear results.

## 2019-11-03 ENCOUNTER — Other Ambulatory Visit: Payer: Self-pay

## 2019-11-03 ENCOUNTER — Ambulatory Visit: Payer: Medicaid Other | Admitting: Family Medicine

## 2019-11-03 ENCOUNTER — Encounter: Payer: Self-pay | Admitting: Family Medicine

## 2019-11-03 VITALS — BP 120/78 | HR 110 | Temp 97.7°F | Ht 64.0 in | Wt 214.4 lb

## 2019-11-03 DIAGNOSIS — I159 Secondary hypertension, unspecified: Secondary | ICD-10-CM

## 2019-11-03 DIAGNOSIS — K219 Gastro-esophageal reflux disease without esophagitis: Secondary | ICD-10-CM | POA: Diagnosis not present

## 2019-11-03 NOTE — Patient Instructions (Signed)
Pt needs to see psy for additional evaluation-keep appointment for evaluation

## 2019-11-03 NOTE — Progress Notes (Signed)
New Patient Office Visit  Subjective:  Patient ID: Kayla [name], female    DOB: April 22, 1971  Age: 49 y.o. MRN: [id number]  CC:  Chief Complaint  Patient presents with  . Establish Care  . Gastroesophageal Reflux  . Anemia    HPI [name] presents for  [facility]-anxiety-Dr. Essie Hart celexa/no haldol/no desyrel HTN-diltiazem daily, prn lasix  Past Medical History:  Diagnosis Date  . Anxiety    Panic attacks; somatization  . Bipolar disorder ([place])   . 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 Endometrial polyp 09/01/2019   Pt aware, make appt with Dr Glo Herring,   . Fibroids 09/01/2019  . Hypertension   . Ovarian cyst    cervical polyp  . Palpitations    possible bradycardia  . Somatization disorder     Past Surgical History:  Procedure Laterality Date  . None  05-22-12   hx of ovarian cysts    Family History  Problem Relation Age of Onset  . Cancer Father   . Hypertension Mother   . Diabetes Mother   . Heart failure Other   . Colon cancer Neg Hx     Social History   Socioeconomic History  . Marital status: Divorced    Spouse name: Not on file  . Number of children: Not on file  . Years of education: Not on file  . Highest education level: Not on file  Occupational History  . Occupation: Unemployed  Tobacco Use  . Smoking status: Never Smoker  . Smokeless tobacco: Never Used  Substance and Sexual Activity  . Alcohol use: No  . Drug use: No  . Sexual activity: Not Currently  Other Topics Concern  . Not on file  Social History Narrative   Divorced.  Lives with her mother and 3 children.     Social Determinants of Health   Financial Resource Strain:   . Difficulty of Paying Living Expenses: Not on file  Food Insecurity:   . Worried About Charity fundraiser in the Last Year: Not on file  . Ran Out of Food in the Last Year: Not on file  Transportation Needs:   . Lack of Transportation (Medical): Not on file  . Lack of Transportation  (Non-Medical): Not on file  Physical Activity:   . Days of Exercise per Week: Not on file  . Minutes of Exercise per Session: Not on file  Stress:   . Feeling of Stress : Not on file  Social Connections:   . Frequency of Communication with Friends and Family: Not on file  . Frequency of Social Gatherings with Friends and Family: Not on file  . Attends Religious Services: Not on file  . Active Member of Clubs or Organizations: Not on file  . Attends Archivist Meetings: Not on file  . Marital Status: Not on file  Intimate Partner Violence:   . Fear of Current or Ex-Partner: Not on file  . Emotionally Abused: Not on file  . Physically Abused: Not on file  . Sexually Abused: Not on file    ROS Review of Systems  Constitutional: Negative.   HENT: Negative.   Eyes: Negative.   Respiratory: Negative.   Cardiovascular: Negative.   Gastrointestinal:       Heme+  Endocrine: Negative.   Genitourinary: Negative.   Musculoskeletal: Negative.   Skin: Negative.   Allergic/Immunologic: Negative.   Neurological: Negative.   Hematological:       Anemia  Psychiatric/Behavioral: Positive for agitation and  behavioral problems. Negative for suicidal ideas. The patient is hyperactive.     Objective:   Today's Vitals: BP 120/78 (BP Location: Left Arm, Patient Position: Sitting, Cuff Size: Normal)   Pulse (!) 110   Temp 97.7 F (36.5 C) (Temporal)   Ht 5' 4"  (1.626 m)   Wt 214 lb 6.4 oz (97.3 kg)   SpO2 96%   BMI 36.80 kg/m   Physical Exam Constitutional:      Appearance: Normal appearance.  HENT:     Head: Normocephalic and atraumatic.  Cardiovascular:     Rate and Rhythm: Normal rate and regular rhythm.     Pulses: Normal pulses.     Heart sounds: Normal heart sounds.  Pulmonary:     Effort: Pulmonary effort is normal.     Breath sounds: Normal breath sounds.  Musculoskeletal:     Cervical back: Normal range of motion and neck supple.  Neurological:     Mental  Status: She is alert and oriented to person, place, and time.     Assessment & Plan:   1. Secondary hypertension Diltiazem-rx Lasix prn for LE edema 2. Gastroesophageal reflux disease, unspecified whether esophagitis present protonix-not currently taking medication Outpatient Encounter Medications as of 11/03/2019  Medication Sig  . diltiazem (TIAZAC) 180 MG 24 hr capsule Take 180 mg by mouth daily.  . furosemide (LASIX) 20 MG tablet Take 20 mg by mouth daily.  . Multiple Vitamin (MULTIVITAMIN) capsule Take 1 capsule by mouth daily. Takes a multivitamin with iron daily  . benztropine (COGENTIN) 1 MG tablet Take 2 mg by mouth at bedtime.  . citalopram (CELEXA) 40 MG tablet Take 40 mg by mouth daily.  . haloperidol (HALDOL) 5 MG tablet Take 5 mg by mouth at bedtime.  Marland Kitchen nystatin-triamcinolone ointment (MYCOLOG) Apply 1 application topically 2 (two) times daily. To affected area. (Patient not taking: Reported on 11/03/2019)  . pantoprazole (PROTONIX) 40 MG tablet Take 1 tablet (40 mg total) by mouth daily before breakfast. (Patient not taking: Reported on 11/03/2019)  . traZODone (DESYREL) 100 MG tablet Take 200 mg by mouth at bedtime.   No facility-administered encounter medications on file as of 11/03/2019.    Follow-up:pt needs to seek psy evaluation-no SI/HI-pt seeing psy in the past-[facility] may come this afternoon-pt having pressed speech   Hannah Beat, MD

## 2019-11-09 ENCOUNTER — Telehealth: Payer: Self-pay

## 2019-11-09 ENCOUNTER — Other Ambulatory Visit: Payer: Medicaid Other | Admitting: Adult Health

## 2019-11-09 NOTE — Telephone Encounter (Signed)
Pt called today and did not understand why Dr Holly Bodily dismissed her.  I let her know that sometimes a patient and a provider are not good fits and Dr Holly Bodily does not want to see this patient.

## 2019-11-21 ENCOUNTER — Telehealth: Payer: Self-pay | Admitting: Obstetrics and Gynecology

## 2019-11-21 NOTE — Telephone Encounter (Signed)
Called patient regarding appointment scheduled in our office and advised to come alone to the visit.   Pre-screen questions asked: 1. Any of the following symptoms of COVID such as chills, fever, cough, shortness of breath, muscle pain, diarrhea, rash, vomiting, abdominal pain, red eye, weakness, bruising, bleeding, joint pain, loss of taste or smell, a severe headache, sore throat, fatigue 2. Any exposure to anyone suspected or confirmed of having COVID-19 3. Awaiting test results for COVID-19  Also,to keep you safe, please use the provided hand sanitizer when you enter the office. We are asking everyone in the office to wear a mask to help prevent the spread of germs. If you have a mask of your own, please wear it to your appointment, if not, we are happy to provide one for you.  Thank you for understanding and your cooperation.    [facility] Staff

## 2019-11-22 ENCOUNTER — Other Ambulatory Visit: Payer: Self-pay

## 2019-11-22 ENCOUNTER — Telehealth: Payer: Self-pay | Admitting: *Deleted

## 2019-11-22 ENCOUNTER — Other Ambulatory Visit: Payer: Self-pay | Admitting: Obstetrics and Gynecology

## 2019-11-22 ENCOUNTER — Encounter: Payer: Self-pay | Admitting: Obstetrics and Gynecology

## 2019-11-22 ENCOUNTER — Ambulatory Visit: Payer: Medicaid Other | Admitting: Obstetrics and Gynecology

## 2019-11-22 VITALS — BP 140/80 | HR 92 | Ht 64.0 in | Wt 221.8 lb

## 2019-11-22 DIAGNOSIS — N841 Polyp of cervix uteri: Secondary | ICD-10-CM | POA: Diagnosis not present

## 2019-11-22 DIAGNOSIS — R9389 Abnormal findings on diagnostic imaging of other specified body structures: Secondary | ICD-10-CM

## 2019-11-22 DIAGNOSIS — Z3202 Encounter for pregnancy test, result negative: Secondary | ICD-10-CM

## 2019-11-22 NOTE — Patient Instructions (Signed)
Endometrial Biopsy, Care After This sheet gives you information about how to care for yourself after your procedure. Your health care provider may also give you more specific instructions. If you have problems or questions, contact your health care provider. What can I expect after the procedure? After the procedure, it is common to have:  Mild cramping.  A small amount of vaginal bleeding for a few days. This is normal. Follow these instructions at home:   Take over-the-counter and prescription medicines only as told by your health care provider.  Do not douche, use tampons, or have sexual intercourse until your health care provider approves.  Return to your normal activities as told by your health care provider. Ask your health care provider what activities are safe for you.  Follow instructions from your health care provider about any activity restrictions, such as restrictions on strenuous exercise or heavy lifting. Contact a health care provider if:  You have heavy bleeding, or bleed for longer than 2 days after the procedure.  You have bad smelling discharge from your vagina.  You have a fever or chills.  You have a burning sensation when urinating or you have difficulty urinating.  You have severe pain in your lower abdomen. Get help right away if:  You have severe cramps in your stomach or back.  You pass large blood clots.  Your bleeding increases.  You become weak or light-headed, or you pass out. Summary  After the procedure, it is common to have mild cramping and a small amount of vaginal bleeding for a few days.  Do not douche, use tampons, or have sexual intercourse until your health care provider approves.  Return to your normal activities as told by your health care provider. Ask your health care provider what activities are safe for you. This information is not intended to replace advice given to you by your health care provider. Make sure you discuss any  questions you have with your health care provider. Document Revised: 09/11/2017 Document Reviewed: 10/15/2016 Elsevier Patient Education  [place].

## 2019-11-22 NOTE — Progress Notes (Signed)
Patient ID: Kayla [name], female   DOB: Jun 24, 1971, 49 y.o.   MRN: [id number]  Patient given informed consent, signed copy in the chart, time out was performed. Appropriate time out taken. The patient was placed in the lithotomy position and the cervix brought into view with sterile speculum.  Portio of cervix cleansed x 2 with betadine swabs. Endocervical polyp visualized and clipped.  A tenaculum was placed in the anterior lip of the cervix.  The uterus was sounded for depth of 11 cm. A pipelle was introduced to into the uterus, suction created,  and an endometrial sample was obtained. All equipment was removed and accounted for.  The patient tolerated the procedure well.   Patient given post procedure instructions. The patient will return in 2 weeks for results.   By signing my name below, I, Samul Dada, attest that this documentation has been prepared under the direction and in the presence of Jonnie Kind, MD. Electronically Signed: [place]. 11/22/19. 11:43 AM.  I personally performed the services described in this documentation, which was SCRIBED in my presence. The recorded information has been reviewed and considered accurate. It has been edited as necessary during review. Jonnie Kind, MD

## 2019-11-22 NOTE — Telephone Encounter (Signed)
Patient called stating had biopsy on polyp today. Is now bleeding and having little blood clots. States went to restroom and pad was bloody.

## 2019-11-22 NOTE — Telephone Encounter (Signed)
Sent to clinical pool

## 2019-11-22 NOTE — Telephone Encounter (Signed)
Telephoned patient at home. Patient stated only had a few small clots when going to the restroom but was only one time. Did have some bleeding. Advised patient it is normal to have some bleeding after the procedure. Advised patient if bleeding gets heavy or blood clots are worse to call back. Patient voiced understanding.

## 2019-11-22 NOTE — Addendum Note (Signed)
Addended by: Linton Rump on: 11/22/2019 12:37 PM   Modules accepted: Orders

## 2019-11-23 NOTE — Progress Notes (Signed)
Benign endocervical polyp

## 2019-11-24 ENCOUNTER — Telehealth: Payer: Self-pay | Admitting: *Deleted

## 2019-11-24 NOTE — Telephone Encounter (Signed)
Telephoned patient at home advised biopsy results were benign. Patient voiced understanding.

## 2019-11-25 ENCOUNTER — Telehealth: Payer: Self-pay | Admitting: *Deleted

## 2019-11-25 NOTE — Telephone Encounter (Signed)
Pt states that she needs to speak with Dr. Glo Herring. She is afraid that she is entering menopause. She would like to get a medicine to prevent this from happening to her because she is "too young".

## 2019-12-05 ENCOUNTER — Telehealth: Payer: Self-pay | Admitting: Obstetrics and Gynecology

## 2019-12-05 NOTE — Telephone Encounter (Signed)
Tried to reach the patient by phone to remind her of her appointment/restrictions multiple times.  Her line was constantly busy.

## 2019-12-06 ENCOUNTER — Encounter: Payer: Self-pay | Admitting: Gastroenterology

## 2019-12-06 ENCOUNTER — Telehealth: Payer: Medicaid Other | Admitting: Obstetrics and Gynecology

## 2019-12-08 ENCOUNTER — Other Ambulatory Visit: Payer: Self-pay

## 2019-12-08 ENCOUNTER — Ambulatory Visit
Admission: EM | Admit: 2019-12-08 | Discharge: 2019-12-08 | Disposition: A | Discharge status: Home / Self Care | Payer: Medicaid Other | Attending: Emergency Medicine | Admitting: Emergency Medicine

## 2019-12-08 DIAGNOSIS — R109 Unspecified abdominal pain: Secondary | ICD-10-CM | POA: Insufficient documentation

## 2019-12-08 DIAGNOSIS — R300 Dysuria: Secondary | ICD-10-CM

## 2019-12-08 LAB — POCT URINALYSIS DIP (MANUAL ENTRY)
Bilirubin, UA: NEGATIVE
Glucose, UA: NEGATIVE mg/dL
Nitrite, UA: NEGATIVE
Protein Ur, POC: NEGATIVE mg/dL
Spec Grav, UA: >=1.03 — AB (ref 1.010–1.025)
Urobilinogen, UA: 0.2 E.U./dL
pH, UA: 6 (ref 5.0–8.0)

## 2019-12-08 LAB — POCT URINE PREGNANCY: Preg Test, Ur: NEGATIVE

## 2019-12-08 MED ORDER — NITROFURANTOIN MONOHYD MACRO 100 MG PO CAPS: 100.0000 mg | ORAL_CAPSULE | Freq: Two times a day (BID) | ORAL | 0 refills | Status: DC

## 2019-12-08 MED ORDER — PHENAZOPYRIDINE HCL 200 MG PO TABS: 200.0000 mg | ORAL_TABLET | Freq: Three times a day (TID) | ORAL | 0 refills | Status: DC

## 2019-12-08 NOTE — Discharge Instructions (Signed)
Urine did show signs of infection.   Urine culture sent.  We will call you with the results.   Push fluids and get plenty of rest.   Take antibiotic as directed and to completion Take pyridium as prescribed and as needed for symptomatic relief Follow up with PCP if symptoms persists Return here or go to ER if you have any new or worsening symptoms such as fever, worsening abdominal pain, nausea/vomiting, flank pain, etc..Marland Kitchen

## 2019-12-08 NOTE — ED Provider Notes (Signed)
[place]   CC: Multiple complaints  SUBJECTIVE: Patient is a poor historian   Kayla [name] is a 49 y.o. female who complains of decreased urine output, and intermittent flank pain x 2 day.  Patient denies a precipitating event, or trauma. Concern for "kidney infection." Localizes the pain to the flank.  Pain is intermittent.  Denies aggravating or alleviating factors. Denies fever, chills, nausea, vomiting, CP, SOB.    Also mentions spitting of yellow stuff and legs swollen.  On lasix, but states it is not working.    LMP: No LMP recorded (lmp unknown).  ROS: As in HPI.  All other pertinent ROS negative.     Past Medical History:  Diagnosis Date  . Anxiety    Panic attacks; somatization  . Bipolar disorder ([place])   . 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 Endometrial polyp 09/01/2019   Pt aware, make appt with Dr Glo Herring,   . Fibroids 09/01/2019  . Hypertension   . Ovarian cyst    cervical polyp  . Palpitations    possible bradycardia  . Somatization disorder    Past Surgical History:  Procedure Laterality Date  . None  05-22-12   hx of ovarian cysts   Allergies  Allergen Reactions  . Latex Itching   No current facility-administered medications on file prior to encounter.   Current Outpatient Medications on File Prior to Encounter  Medication Sig Dispense Refill  . diltiazem (TIAZAC) 180 MG 24 hr capsule Take 180 mg by mouth daily.    . Ferrous Sulfate (IRON PO) Take by mouth daily.    . furosemide (LASIX) 20 MG tablet Take 20 mg by mouth daily.     Social History   Socioeconomic History  . Marital status: Divorced    Spouse name: Not on file  . Number of children: Not on file  . Years of education: Not on file  . Highest education level: Not on file  Occupational History  . Occupation: Unemployed  Tobacco Use  . Smoking status: Never Smoker  . Smokeless tobacco: Never Used  Substance and Sexual Activity  . Alcohol use: No  . Drug use: No  .  Sexual activity: Not Currently  Other Topics Concern  . Not on file  Social History Narrative   Divorced.  Lives with her mother and 3 children.     Social Determinants of Health   Financial Resource Strain:   . Difficulty of Paying Living Expenses: Not on file  Food Insecurity:   . Worried About Charity fundraiser in the Last Year: Not on file  . Ran Out of Food in the Last Year: Not on file  Transportation Needs:   . Lack of Transportation (Medical): Not on file  . Lack of Transportation (Non-Medical): Not on file  Physical Activity:   . Days of Exercise per Week: Not on file  . Minutes of Exercise per Session: Not on file  Stress:   . Feeling of Stress : Not on file  Social Connections:   . Frequency of Communication with Friends and Family: Not on file  . Frequency of Social Gatherings with Friends and Family: Not on file  . Attends Religious Services: Not on file  . Active Member of Clubs or Organizations: Not on file  . Attends Archivist Meetings: Not on file  . Marital Status: Not on file  Intimate Partner Violence:   . Fear of Current or Ex-Partner: Not on file  . Emotionally Abused:  Not on file  . Physically Abused: Not on file  . Sexually Abused: Not on file   Family History  Problem Relation Age of Onset  . Cancer Father   . Hypertension Mother   . Diabetes Mother   . Heart failure Other   . Colon cancer Neg Hx     OBJECTIVE:  Vitals:   12/08/19 1924  BP: 136/74  Pulse: 96  Resp: 18  Temp: 98.3 F (36.8 C)  SpO2: 96%   Exam limited due to patient talking excessively  General appearance: Alert in no acute distress; unkept, disheveled appearance HEENT: NCAT.  Oropharynx clear.  Lungs: clear to auscultation bilaterally without adventitious breath sounds Heart: regular rate and rhythm.   Abdomen: soft; non-distended; no tenderness; bowel sounds present; no guarding Back: no CVA tenderness Extremities: no edema; symmetrical with no gross  deformities Skin: warm and dry Neurologic: Ambulates from chair to exam table without difficulty Psychological: alert and cooperative; abnormal mood and affect; manic  Labs Reviewed  POCT URINALYSIS DIP (MANUAL ENTRY) - Abnormal; Notable for the following components:      Result Value   Ketones, POC UA small (15) (*)    Spec Grav, UA >=1.030 (*)    Blood, UA trace-intact (*)    Leukocytes, UA Trace (*)    All other components within normal limits  URINE CULTURE  POCT URINE PREGNANCY    ASSESSMENT & PLAN:  1. Flank pain     Meds ordered this encounter  Medications  . DISCONTD: nitrofurantoin, macrocrystal-monohydrate, (MACROBID) 100 MG capsule    Sig: Take 1 capsule (100 mg total) by mouth 2 (two) times daily.    Dispense:  10 capsule    Refill:  0    Order Specific Question:   Supervising Provider    Answer:   Raylene Everts [[id number]]  . DISCONTD: phenazopyridine (PYRIDIUM) 200 MG tablet    Sig: Take 1 tablet (200 mg total) by mouth 3 (three) times daily.    Dispense:  6 tablet    Refill:  0    Order Specific Question:   Supervising Provider    Answer:   Raylene Everts [[id number]]  . nitrofurantoin, macrocrystal-monohydrate, (MACROBID) 100 MG capsule    Sig: Take 1 capsule (100 mg total) by mouth 2 (two) times daily.    Dispense:  10 capsule    Refill:  0    Order Specific Question:   Supervising Provider    Answer:   Raylene Everts [[id number]]   Offered patient further evaluation and management in the ED for multiple complaints including spitting up yellow stuff, and lower extremity swelling.  Patient declines at this time.  Will go to the ED tomorrow for further evaluation and management.  We will treat for UTI today.    Urine did show signs of infection.   Urine culture sent.  We will call you with the results.   Push fluids and get plenty of rest.   Take antibiotic as directed and to completion Follow up with PCP if symptoms persists Return here or go to  ER if you have any new or worsening symptoms such as fever, worsening abdominal pain, nausea/vomiting, flank pain, etc...  Outlined signs and symptoms indicating need for more acute intervention. Patient verbalized understanding. After Visit Summary given.     Lestine Box, PA-C 12/08/19 1958

## 2019-12-08 NOTE — ED Triage Notes (Signed)
Pt presents with c/o lower back pain and some dysuria , pt also has multiple other complaints with leg edema

## 2019-12-10 LAB — URINE CULTURE: Culture: NO GROWTH

## 2019-12-12 ENCOUNTER — Encounter: Payer: Self-pay | Admitting: Gastroenterology

## 2019-12-13 ENCOUNTER — Telehealth: Payer: Self-pay | Admitting: Gastroenterology

## 2019-12-13 ENCOUNTER — Telehealth: Payer: Self-pay | Admitting: *Deleted

## 2019-12-13 NOTE — Telephone Encounter (Signed)
Called pt back.  Pt said that she went to [facility] recently and had labs drawn.  She said that her WBC and RBC count were abnormal.  She said that she also had recent pap done at [facility] that came back abnormal.  She has a future TCS with Propofol scheduled for 01/24/2020.  She would like [place] to review labs from [facility].  Requested labs from [facility] and will place in your office for review once received.

## 2019-12-13 NOTE — Telephone Encounter (Signed)
Received and reviewed labs completed at [facility] on 12/12/19. CMP without significant abnormalities. Electrolytes, kidney function, and liver function tests within normal limits. CBC with WBC 8.5 (normal), hemoglobin 11.3 (L), MCH 25.7 (L), MCHC 31.1 (L), platelets normal at 273.   Lab results suggest iron deficiency anemia which is why we are pursuing EGD/TCS. Overall, her hemoglobin is improving (up from 9.5 in October 2020) which I suspect is secondary to her iron supplements. She should continue taking her iron and proceed with procedures as scheduled. Be sure to hold iron 7 days prior to procedure.   We will have lab results scanned into her chart.

## 2019-12-13 NOTE — Telephone Encounter (Signed)
Pt called wanting Dr. Glo Herring to know she was seen at Urgent Care on 2/25 and [facility] on 3/1. Wants Dr. Glo Herring to review the notes. Please call pt. Thanks!! [place]

## 2019-12-13 NOTE — Telephone Encounter (Signed)
Noted  

## 2019-12-13 NOTE — Telephone Encounter (Signed)
Called pt and went over [facility] overall lab review and recommendations with pt.  Pt was advised to continue taking her iron and proceed with procedures as scheduled.  Pt was informed not to take iron 7 days prior to procedure.  Pt voiced understanding.

## 2019-12-13 NOTE — Telephone Encounter (Signed)
[place] LABS SHE HAD DONE YESTERDAY AT [facility]

## 2019-12-14 NOTE — Telephone Encounter (Signed)
Followup telephone call to patient with multiple anxieties., and health care concerns. Pt is searching on internet and getting worried.  Chart reiewed. --Recent Pap showed ASCUS with NEG HPV.  Recommendation is for repeat pap in 3 yrs. Pt is worried enough that annual pap is likely a better plan.Pt comfortable with pap in a year.  -- Pt had u/a with trace leukocytes and negative protein, trace RBC at recent urgent care visit, which is being treated for "UTI"  Out of an abundance of caution. Pt seen at [facility], 3/1 is in a walking cast for concerns over ankle discomfort, has orthopedic f/u scheduled.  - Pt has apprehension over x-rays being taken, as she believes leg swelling she notices is due to xrays taken of her leg years ago at [facility]. Pt advised to be less anxious over care.  PT to make appt in a year for next pap, or as desired for gyn concerns, or followup regarding Urinary tract concerns.

## 2019-12-15 ENCOUNTER — Ambulatory Visit: Payer: Medicaid Other | Admitting: Gastroenterology

## 2019-12-19 ENCOUNTER — Ambulatory Visit: Payer: Medicaid Other | Admitting: Gastroenterology

## 2020-01-16 ENCOUNTER — Telehealth: Payer: Self-pay

## 2020-01-16 NOTE — Telephone Encounter (Signed)
Noted. Will follow-up as scheduled.

## 2020-01-16 NOTE — Telephone Encounter (Signed)
Pt called office 01/13/20 and LMOVM to cancel TCS/EGD scheduled for 01/24/20. Stated she got a call from the hospital to verify her medications for the procedure. She doesn't want to be put to sleep. Has a fear of being put to sleep. Hospital told her to call Dr. Oneida Alar, she doesn't know who Dr. Oneida Alar is. No one can force her to go through with it and she doesn't have to do anything she doesn't want to.  Called pt and informed her I received her message and procedure will be caneled.  LMOVM for endo scheduler to cancel procedure.

## 2020-01-20 ENCOUNTER — Other Ambulatory Visit (HOSPITAL_COMMUNITY): Payer: Medicaid Other

## 2020-01-20 ENCOUNTER — Encounter (HOSPITAL_COMMUNITY): Admission: RE | Admit: 2020-01-20 | Payer: Medicaid Other | Source: Ambulatory Visit

## 2020-01-24 ENCOUNTER — Encounter (HOSPITAL_COMMUNITY): Payer: Self-pay

## 2020-01-24 ENCOUNTER — Ambulatory Visit (HOSPITAL_COMMUNITY): Admit: 2020-01-24 | Payer: Medicaid Other | Admitting: Gastroenterology

## 2020-01-24 SURGERY — COLONOSCOPY WITH PROPOFOL
Anesthesia: Monitor Anesthesia Care

## 2020-03-01 ENCOUNTER — Telehealth: Payer: Self-pay | Admitting: Gastroenterology

## 2020-03-01 ENCOUNTER — Encounter: Payer: Self-pay | Admitting: Gastroenterology

## 2020-03-01 ENCOUNTER — Ambulatory Visit: Payer: Medicaid Other | Admitting: Gastroenterology

## 2020-03-01 NOTE — Telephone Encounter (Signed)
Noted  

## 2020-03-01 NOTE — Telephone Encounter (Signed)
PATIENT WAS A NO SHOW AND LETTER SENT  °

## 2020-03-15 ENCOUNTER — Other Ambulatory Visit: Payer: Self-pay | Admitting: Internal Medicine

## 2020-03-15 DIAGNOSIS — R600 Localized edema: Secondary | ICD-10-CM

## 2020-03-21 ENCOUNTER — Ambulatory Visit
Admission: RE | Admit: 2020-03-21 | Discharge: 2020-03-21 | Disposition: A | Discharge status: Home / Self Care | Payer: Medicaid Other | Source: Ambulatory Visit | Attending: Internal Medicine | Admitting: Internal Medicine

## 2020-03-21 DIAGNOSIS — R600 Localized edema: Secondary | ICD-10-CM

## 2020-05-23 DIAGNOSIS — M7989 Other specified soft tissue disorders: Secondary | ICD-10-CM | POA: Insufficient documentation

## 2020-05-23 DIAGNOSIS — I872 Venous insufficiency (chronic) (peripheral): Secondary | ICD-10-CM | POA: Insufficient documentation

## 2020-05-25 ENCOUNTER — Other Ambulatory Visit: Payer: Self-pay

## 2020-05-25 ENCOUNTER — Encounter: Payer: Self-pay | Admitting: Emergency Medicine

## 2020-05-25 ENCOUNTER — Ambulatory Visit
Admission: EM | Admit: 2020-05-25 | Discharge: 2020-05-25 | Disposition: A | Discharge status: Home / Self Care | Payer: Medicaid Other | Attending: Emergency Medicine | Admitting: Emergency Medicine

## 2020-05-25 DIAGNOSIS — R300 Dysuria: Secondary | ICD-10-CM | POA: Diagnosis not present

## 2020-05-25 DIAGNOSIS — R350 Frequency of micturition: Secondary | ICD-10-CM | POA: Diagnosis not present

## 2020-05-25 LAB — POCT URINALYSIS DIP (MANUAL ENTRY)
Bilirubin, UA: NEGATIVE
Blood, UA: NEGATIVE
Glucose, UA: NEGATIVE mg/dL
Ketones, POC UA: NEGATIVE mg/dL
Leukocytes, UA: NEGATIVE
Nitrite, UA: NEGATIVE
Protein Ur, POC: NEGATIVE mg/dL
Spec Grav, UA: 1.025 (ref 1.010–1.025)
Urobilinogen, UA: 0.2 E.U./dL
pH, UA: 5.5 (ref 5.0–8.0)

## 2020-05-25 MED ORDER — PHENAZOPYRIDINE HCL 100 MG PO TABS: 100.0000 mg | ORAL_TABLET | Freq: Three times a day (TID) | ORAL | 0 refills | Status: DC | PRN

## 2020-05-25 NOTE — ED Provider Notes (Addendum)
[facility]   CC: Burning with urination  SUBJECTIVE:  Kayla [name] is a 49 y.o. female who presents to urgent care with a complaint of dysuria and urinary frequency and low back pain for the past few days.  Patient denies a precipitating event, recent sexual encounter, excessive caffeine intake.  Localizes the pain to the lower back.  Pain is intermittent and describes it as aching.  Has tried OTC medications without relief.  Symptoms are made worse with urination.  Admits to similar symptoms in the past.  Denies fever, chills, nausea, vomiting, abdominal pain, flank pain, abnormal vaginal discharge or bleeding, hematuria.    LMP: Patient's last menstrual period was 05/20/2020.  ROS: As in HPI.  All other pertinent ROS negative.     Past Medical History:  Diagnosis Date  . Anxiety    Panic attacks; somatization  . Bipolar disorder ([place])   . 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 Endometrial polyp 09/01/2019   Pt aware, make appt with Dr Glo Herring,   . Fibroids 09/01/2019  . Hypertension   . Ovarian cyst    cervical polyp  . Palpitations    possible bradycardia  . Somatization disorder    Past Surgical History:  Procedure Laterality Date  . None  05-22-12   hx of ovarian cysts   Allergies  Allergen Reactions  . Latex Itching   No current facility-administered medications on file prior to encounter.   Current Outpatient Medications on File Prior to Encounter  Medication Sig Dispense Refill  . diltiazem (TIAZAC) 180 MG 24 hr capsule Take 180 mg by mouth daily.    . Ferrous Sulfate (IRON PO) Take by mouth daily.    . furosemide (LASIX) 20 MG tablet Take 20 mg by mouth daily.    . nitrofurantoin, macrocrystal-monohydrate, (MACROBID) 100 MG capsule Take 1 capsule (100 mg total) by mouth 2 (two) times daily. 10 capsule 0   Social History   Socioeconomic History  . Marital status: Divorced    Spouse name: Not on file  . Number of children: Not on file  . Years of  education: Not on file  . Highest education level: Not on file  Occupational History  . Occupation: Unemployed  Tobacco Use  . Smoking status: Never Smoker  . Smokeless tobacco: Never Used  Vaping Use  . Vaping Use: Never used  Substance and Sexual Activity  . Alcohol use: No  . Drug use: No  . Sexual activity: Not Currently  Other Topics Concern  . Not on file  Social History Narrative   Divorced.  Lives with her mother and 3 children.     Social Determinants of Health   Financial Resource Strain:   . Difficulty of Paying Living Expenses:   Food Insecurity:   . Worried About Charity fundraiser in the Last Year:   . Arboriculturist in the Last Year:   Transportation Needs:   . Film/video editor (Medical):   Marland Kitchen Lack of Transportation (Non-Medical):   Physical Activity:   . Days of Exercise per Week:   . Minutes of Exercise per Session:   Stress:   . Feeling of Stress :   Social Connections:   . Frequency of Communication with Friends and Family:   . Frequency of Social Gatherings with Friends and Family:   . Attends Religious Services:   . Active Member of Clubs or Organizations:   . Attends Archivist Meetings:   Marland Kitchen Marital Status:  Intimate Partner Violence:   . Fear of Current or Ex-Partner:   . Emotionally Abused:   Marland Kitchen Physically Abused:   . Sexually Abused:    Family History  Problem Relation Age of Onset  . Cancer Father   . Hypertension Mother   . Diabetes Mother   . Heart failure Other   . Colon cancer Neg Hx     OBJECTIVE:  Vitals:   05/25/20 1509 05/25/20 1512  BP:  124/78  Pulse:  79  Resp:  17  Temp:  98.6 F (37 C)  TempSrc:  Oral  SpO2:  94%  Weight: 215 lb (97.5 kg)   Height: 5' 5"  (1.651 m)    General appearance: AOx3 in no acute distress HEENT: NCAT.  Oropharynx clear.  Lungs: clear to auscultation bilaterally without adventitious breath sounds Heart: regular rate and rhythm.  Radial pulses 2+ symmetrical  bilaterally Abdomen: soft; non-distended; no tenderness; bowel sounds present; no guarding or rebound tenderness Back: no CVA tenderness Extremities: no edema; symmetrical with no gross deformities Skin: warm and dry Neurologic: Ambulates from chair to exam table without difficulty Psychological: alert and cooperative; normal mood and affect  Labs Reviewed  URINE CULTURE  POCT URINALYSIS DIP (MANUAL ENTRY)  CERVICOVAGINAL ANCILLARY ONLY    ASSESSMENT & PLAN:  1. Dysuria   2. Urine frequency     Meds ordered this encounter  Medications  . phenazopyridine (PYRIDIUM) 100 MG tablet    Sig: Take 1 tablet (100 mg total) by mouth 3 (three) times daily as needed for pain.    Dispense:  10 tablet    Refill:  0   Discharge Instructions Urine culture and cervical ancillary was sent.  We will call you with abnormal results.   Push fluids and get plenty of rest.   Take pyridium as prescribed and as needed for symptomatic relief Follow up with PCP if symptoms persists Return here or go to ER if you have any new or worsening symptoms such as fever, worsening abdominal pain, nausea/vomiting, flank pain, etc...  Outlined signs and symptoms indicating need for more acute intervention. Patient verbalized understanding. After Visit Summary given.    Note: This document was prepared using Dragon voice recognition software and may include unintentional dictation errors.    Emerson Monte, FNP 05/25/20 1557    Emerson Monte, FNP 05/25/20 1600

## 2020-05-25 NOTE — Discharge Instructions (Addendum)
Urine culture and cervical ancillary was sent.  We will call you with abnormal results.   Push fluids and get plenty of rest.   Take pyridium as prescribed and as needed for symptomatic relief Follow up with PCP if symptoms persists Return here or go to ER if you have any new or worsening symptoms such as fever, worsening abdominal pain, nausea/vomiting, flank pain, etc..Marland Kitchen

## 2020-05-25 NOTE — ED Triage Notes (Signed)
Burning and urinary frequency, also reports lower back pain.

## 2020-05-27 LAB — URINE CULTURE
Culture: <10000 — AB
Special Requests: NORMAL

## 2020-05-28 LAB — CERVICOVAGINAL ANCILLARY ONLY
Bacterial Vaginitis (gardnerella): NEGATIVE
Candida Glabrata: NEGATIVE
Candida Vaginitis: NEGATIVE
Chlamydia: NEGATIVE
Comment: NEGATIVE
Comment: NEGATIVE
Comment: NEGATIVE
Comment: NEGATIVE
Comment: NEGATIVE
Comment: NORMAL
Neisseria Gonorrhea: NEGATIVE
Trichomonas: NEGATIVE

## 2020-05-29 ENCOUNTER — Ambulatory Visit: Payer: Medicaid Other | Admitting: Obstetrics and Gynecology

## 2020-05-29 ENCOUNTER — Encounter: Payer: Self-pay | Admitting: Obstetrics and Gynecology

## 2020-05-29 ENCOUNTER — Other Ambulatory Visit: Payer: Self-pay

## 2020-05-29 VITALS — BP 159/98 | HR 114 | Ht 65.0 in | Wt 221.8 lb

## 2020-05-29 DIAGNOSIS — R739 Hyperglycemia, unspecified: Secondary | ICD-10-CM | POA: Diagnosis not present

## 2020-05-29 DIAGNOSIS — R102 Pelvic and perineal pain unspecified side: Secondary | ICD-10-CM

## 2020-05-29 DIAGNOSIS — M25471 Effusion, right ankle: Secondary | ICD-10-CM

## 2020-05-29 DIAGNOSIS — F419 Anxiety disorder, unspecified: Secondary | ICD-10-CM | POA: Diagnosis not present

## 2020-05-29 DIAGNOSIS — M25472 Effusion, left ankle: Secondary | ICD-10-CM

## 2020-05-29 NOTE — Progress Notes (Addendum)
PATIENT ID: Kayla [name], female     DOB: 01-Jun-1971, 49 y.o.     MRN: [id number]   [place] [facility] Visit  05/29/20     PATIENT NAME: Kayla [name]     MRN [id number]     DOB: Sep 16, 1971  CC & HPI:  No chief complaint on file.  [name] is a 49 y.o. female presenting today for an evaluation of a knot on her vagina.   She was recently seen in the ED on 05/25/2020 for dysuria and increased urinary frequency with low back pain. She had a recent sexual encounter. Urine culture revealed <10,000 colonies/mL Uryalysis revealed small amount of ketones, trace amount of blood, and a trace amount of leukocytes. She was prescribed phenazopyridine 156m TID for pain.  She has a long rambling history of anxieties, having a Dr. Case who described her left leg as having edema, lymphedema..  Additionally she reports that her mother medical specialist, "CGerald Stabs who recommends left knee x-rays but she is afraid to get obtain these.  She has significant concerns of" radiation effect" on her leg  From a simple xray , which we tried to reassure GYN history LMP August, 8-11 Then 1 day of bleeding acute discomfort 1 day later   ROS:  Review of Systems   Pertinent History Reviewed:  Medical         Past Medical History:  Diagnosis Date  . Anxiety    Panic attacks; somatization  . Bipolar disorder ([place]   . 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 Endometrial polyp 09/01/2019   Pt aware, make appt with Dr FGlo Herring   . Fibroids 09/01/2019  . Hypertension   . Ovarian cyst    cervical polyp  . Palpitations    possible bradycardia  . Somatization disorder                               Surgical Hx:    Past Surgical History:  Procedure Laterality Date  . None  05-22-12   hx of ovarian cysts   Medications: Reviewed & Updated - see associated section                       Current Outpatient Medications:  .  diltiazem (TIAZAC) 180 MG 24 hr capsule, Take 180 mg by mouth daily., Disp: , Rfl:  .   Ferrous Sulfate (IRON PO), Take by mouth daily., Disp: , Rfl:  .  furosemide (LASIX) 20 MG tablet, Take 20 mg by mouth daily., Disp: , Rfl:  .  nitrofurantoin, macrocrystal-monohydrate, (MACROBID) 100 MG capsule, Take 1 capsule (100 mg total) by mouth 2 (two) times daily., Disp: 10 capsule, Rfl: 0 .  phenazopyridine (PYRIDIUM) 100 MG tablet, Take 1 tablet (100 mg total) by mouth 3 (three) times daily as needed for pain., Disp: 10 tablet, Rfl: 0   Social History: Reviewed -  reports that she has never smoked. She has never used smokeless tobacco.  Objective Findings:  Vitals: Last menstrual period 05/20/2020.  PHYSICAL EXAMINATION General appearance - alert, well appearing, and in no distress and overweight Mental status - alert, oriented to person, place, and time hypertalkative, pressured speech, interrupts continuously Chest -  Heart -  Abdomen - soft, nontender, nondistended, no masses or organomegaly Breasts -  Skin - normal coloration and turgor, no rashes, no suspicious skin lesions noted  PELVIC External genitalia - normal  Vulva -  not checked  Vagina - not checked  Cervix -   Uterus -   Adnexa -  [place]et [place] - Rectal - normal rectal, no masses, rectal exam not indicated  Assessment & Plan:   A:  1. Probable hypermanic phase, resulting in multiple anxieties and rambling thoughts 2. Well-healed folliculitis on her buttock 3. Irregular bleeding after last normal menstrual period patient reassured and asked to simply monitor her periods 4.   P:  1. Attempted to reassure the patient that her folliculitis has healed 2. Monitor menstrual periods and keep a record 3. Please choose a single physician, primary physician possibly to coordinate care and reduce her anxieties   By signing my name below, I, [employer], attest that this documentation has been prepared under the direction and in the presence of Jonnie Kind, MD. Electronically Signed: [place]. 05/29/20. 1:31 PM.  I personally performed the services described in this documentation, which was SCRIBED in my presence. The recorded information has been reviewed and considered accurate. It has been edited as necessary during review. Jonnie Kind, MD

## 2020-05-29 NOTE — Patient Instructions (Signed)
Please have your primary care physician send you to a dietitian Please obtain Pap smears annually, that is once a year

## 2020-06-12 ENCOUNTER — Other Ambulatory Visit (HOSPITAL_COMMUNITY): Payer: Self-pay | Admitting: Family Medicine

## 2020-06-12 ENCOUNTER — Other Ambulatory Visit: Payer: Self-pay | Admitting: Family Medicine

## 2020-06-12 DIAGNOSIS — M25562 Pain in left knee: Secondary | ICD-10-CM

## 2020-06-12 DIAGNOSIS — R600 Localized edema: Secondary | ICD-10-CM

## 2020-06-21 ENCOUNTER — Ambulatory Visit (HOSPITAL_COMMUNITY)
Admission: RE | Admit: 2020-06-21 | Discharge: 2020-06-21 | Disposition: A | Discharge status: Home / Self Care | Payer: Medicaid Other | Source: Ambulatory Visit | Attending: Family Medicine | Admitting: Family Medicine

## 2020-06-21 ENCOUNTER — Other Ambulatory Visit: Payer: Self-pay

## 2020-06-21 DIAGNOSIS — R600 Localized edema: Secondary | ICD-10-CM | POA: Diagnosis present

## 2020-06-21 DIAGNOSIS — M25562 Pain in left knee: Secondary | ICD-10-CM | POA: Insufficient documentation

## 2020-07-24 ENCOUNTER — Encounter: Payer: Self-pay | Admitting: Orthopedic Surgery

## 2020-07-25 ENCOUNTER — Other Ambulatory Visit: Payer: Self-pay

## 2020-07-25 ENCOUNTER — Ambulatory Visit
Admission: EM | Admit: 2020-07-25 | Discharge: 2020-07-25 | Disposition: A | Discharge status: Home / Self Care | Payer: Medicaid Other | Attending: Emergency Medicine | Admitting: Emergency Medicine

## 2020-07-25 ENCOUNTER — Encounter: Payer: Self-pay | Admitting: Emergency Medicine

## 2020-07-25 DIAGNOSIS — J069 Acute upper respiratory infection, unspecified: Secondary | ICD-10-CM

## 2020-07-25 DIAGNOSIS — Z1152 Encounter for screening for COVID-19: Secondary | ICD-10-CM | POA: Diagnosis not present

## 2020-07-25 MED ORDER — BENZONATATE 100 MG PO CAPS: 100.0000 mg | ORAL_CAPSULE | Freq: Three times a day (TID) | ORAL | 0 refills | Status: DC

## 2020-07-25 MED ORDER — CETIRIZINE HCL 10 MG PO TABS: 10.0000 mg | ORAL_TABLET | Freq: Every day | ORAL | 0 refills | Status: DC

## 2020-07-25 NOTE — Discharge Instructions (Addendum)
COVID testing ordered.  It will take between 2-7 days for test results.  Someone will contact you regarding abnormal results.    In the meantime: You should remain isolated in your home for 10 days from symptomonset AND greater than 24 hours after symptoms resolution (absence of fever without the use of fever-reducing medication and improvement in respiratory symptoms), whichever is longer Get plenty of rest and push fluids Tessalon Perles prescribed for cough Zyrtec for nasal congestion, runny nose, and/or sore throat Use OTC Robitussin for chest congestion Use medications daily for symptom relief Use OTC medications like ibuprofen or tylenol as needed fever or pain Call or go to the ED if you have any new or worsening symptoms such as fever, worsening cough, shortness of breath, chest tightness, chest pain, turning blue, changes in mental status, etc..Marland Kitchen

## 2020-07-25 NOTE — ED Triage Notes (Signed)
Patient states that she has some cough and congestion with production x 1-2 weeks. son recently tested negative for COVID.

## 2020-07-25 NOTE — ED Provider Notes (Signed)
[facility]   [id number] 07/25/20 Arrival Time: 4132   CC: COVID symptoms  SUBJECTIVE: History from: patient.  Kayla [name] is a 49 y.o. female who presents to the urgent care with a complaint of nasal congestion, cough with yellowish mucus for the past 1 to 2 weeks.  Reports some still have the same symptom.  Denies recent travel.  Has tried OTC medication without relief.  Denies aggravating factors.  Denies previous symptoms in the past.   Denies fever, chills, fatigue, sinus pain, rhinorrhea, sore throat, SOB, wheezing, chest pain, nausea, changes in bowel or bladder habits.     ROS: As per HPI.  All other pertinent ROS negative.      Past Medical History:  Diagnosis Date  . Anxiety    Panic attacks; somatization  . Bipolar disorder ([place])   . 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 Endometrial polyp 09/01/2019   Pt aware, make appt with Dr Glo Herring,   . Fibroids 09/01/2019  . Hypertension   . Ovarian cyst    cervical polyp  . Palpitations    possible bradycardia  . Somatization disorder    Past Surgical History:  Procedure Laterality Date  . None  05-22-12   hx of ovarian cysts   Allergies  Allergen Reactions  . Latex Itching   No current facility-administered medications on file prior to encounter.   Current Outpatient Medications on File Prior to Encounter  Medication Sig Dispense Refill  . diltiazem (TIAZAC) 180 MG 24 hr capsule Take 180 mg by mouth.     . Ferrous Sulfate (IRON PO) Take by mouth daily.    . furosemide (LASIX) 20 MG tablet Take 20 mg by mouth daily.    . phenazopyridine (PYRIDIUM) 100 MG tablet Take 1 tablet (100 mg total) by mouth 3 (three) times daily as needed for pain. (Patient not taking: Reported on 05/29/2020) 10 tablet 0   Social History   Socioeconomic History  . Marital status: Divorced    Spouse name: Not on file  . Number of children: Not on file  . Years of education: Not on file  . Highest education level: Not on file    Occupational History  . Occupation: Unemployed  Tobacco Use  . Smoking status: Never Smoker  . Smokeless tobacco: Never Used  Vaping Use  . Vaping Use: Never used  Substance and Sexual Activity  . Alcohol use: No  . Drug use: No  . Sexual activity: Not Currently  Other Topics Concern  . Not on file  Social History Narrative   Divorced.  Lives with her mother and 3 children.     Social Determinants of Health   Financial Resource Strain:   . Difficulty of Paying Living Expenses: Not on file  Food Insecurity:   . Worried About Charity fundraiser in the Last Year: Not on file  . Ran Out of Food in the Last Year: Not on file  Transportation Needs:   . Lack of Transportation (Medical): Not on file  . Lack of Transportation (Non-Medical): Not on file  Physical Activity:   . Days of Exercise per Week: Not on file  . Minutes of Exercise per Session: Not on file  Stress:   . Feeling of Stress : Not on file  Social Connections:   . Frequency of Communication with Friends and Family: Not on file  . Frequency of Social Gatherings with Friends and Family: Not on file  . Attends Religious Services: Not on file  .  Active Member of Clubs or Organizations: Not on file  . Attends Archivist Meetings: Not on file  . Marital Status: Not on file  Intimate Partner Violence:   . Fear of Current or Ex-Partner: Not on file  . Emotionally Abused: Not on file  . Physically Abused: Not on file  . Sexually Abused: Not on file   Family History  Problem Relation Age of Onset  . Cancer Father   . Hypertension Mother   . Diabetes Mother   . Heart failure Other   . Colon cancer Neg Hx     OBJECTIVE:  Vitals:   07/25/20 1040  BP: (!) 150/107  Pulse: (!) 103  Resp: 16  Temp: 98.7 F (37.1 C)  SpO2: 98%     General appearance: alert; appears fatigued, but nontoxic; speaking in full sentences and tolerating own secretions HEENT: NCAT; Ears: EACs clear, TMs pearly gray; Eyes:  PERRL.  EOM grossly intact. Sinuses: nontender; Nose: nares patent without rhinorrhea, Throat: oropharynx clear, tonsils non erythematous or enlarged, uvula midline  Neck: supple without LAD Lungs: unlabored respirations, symmetrical air entry; cough: moderate; no respiratory distress; CTAB Heart: regular rate and rhythm.  Radial pulses 2+ symmetrical bilaterally Skin: warm and dry Psychological: alert and cooperative; normal mood and affect  LABS:  No results found for this or any previous visit (from the past 24 hour(s)).   ASSESSMENT & PLAN:  1. URI with cough and congestion   2. Encounter for screening for COVID-19     Meds ordered this encounter  Medications  . benzonatate (TESSALON) 100 MG capsule    Sig: Take 1 capsule (100 mg total) by mouth every 8 (eight) hours.    Dispense:  30 capsule    Refill:  0  . cetirizine (ZYRTEC ALLERGY) 10 MG tablet    Sig: Take 1 tablet (10 mg total) by mouth daily.    Dispense:  30 tablet    Refill:  0    COVID testing ordered.  It will take between 2-7 days for test results.  Someone will contact you regarding abnormal results.    In the meantime: You should remain isolated in your home for 10 days from symptomonset AND greater than 24 hours after symptoms resolution (absence of fever without the use of fever-reducing medication and improvement in respiratory symptoms), whichever is longer Get plenty of rest and push fluids Tessalon Perles prescribed for cough Zyrtec for nasal congestion, runny nose, and/or sore throat Use OTC Robitussin for chest congestion Use medications daily for symptom relief Use OTC medications like ibuprofen or tylenol as needed fever or pain Call or go to the ED if you have any new or worsening symptoms such as fever, worsening cough, shortness of breath, chest tightness, chest pain, turning blue, changes in mental status, etc...   Reviewed expectations re: course of current medical issues. Questions  answered. Outlined signs and symptoms indicating need for more acute intervention. Patient verbalized understanding. After Visit Summary given.         Emerson Monte, FNP 07/25/20 1106

## 2020-07-26 LAB — SARS-COV-2, NAA 2 DAY TAT

## 2020-07-26 LAB — NOVEL CORONAVIRUS, NAA: SARS-CoV-2, NAA: NOT DETECTED

## 2020-08-14 ENCOUNTER — Ambulatory Visit: Payer: Medicaid Other | Admitting: Orthopedic Surgery

## 2020-08-21 ENCOUNTER — Ambulatory Visit (INDEPENDENT_AMBULATORY_CARE_PROVIDER_SITE_OTHER): Payer: Medicaid Other | Admitting: Orthopedic Surgery

## 2020-08-21 ENCOUNTER — Other Ambulatory Visit: Payer: Self-pay

## 2020-08-21 ENCOUNTER — Encounter: Payer: Self-pay | Admitting: Orthopedic Surgery

## 2020-08-21 DIAGNOSIS — G8929 Other chronic pain: Secondary | ICD-10-CM | POA: Diagnosis not present

## 2020-08-21 DIAGNOSIS — M25561 Pain in right knee: Secondary | ICD-10-CM

## 2020-08-21 DIAGNOSIS — M25562 Pain in left knee: Secondary | ICD-10-CM

## 2020-08-21 NOTE — Progress Notes (Signed)
New Patient Visit  Assessment: Kayla [name] is a 49 y.o. female with the following: Chronic left knee pain and swelling   Plan: Kayla [name] has a complicated medical history, including complaints associated with her left knee and leg.  I reviewed the MRI of her left knee, and although there are some small findings, I do not think that her meniscus is causing her discomfort or the swelling in her leg which is her biggest complaint.  On physical exam, I am not concerned about her range of motion or stability in the knee.  Once again, her biggest complaint is the swelling throughout the left leg, which I do not believe is coming from her left knee.  I have encouraged her to take medications as needed for her left knee, including Tylenol or over-the-counter NSAIDs, and I have urged her to remain as active as possible.  Combined, this should improve any discomfort she has in her left knee.  No further follow-up is needed.   Follow-up: Return if symptoms worsen or fail to improve.  Subjective:  Chief Complaint  Patient presents with  . Knee Pain    left knee worse than right. no injury,     History of Present Illness: Kayla [name] is a 49 y.o. female who has been referred to clinic today by Denyce Robert, FNP for evaluation of left knee pain.  It is unclear when that the pain in her knee started.  However, her biggest complaint is the swelling she has in her left leg.  She did fall directly onto her left knee earlier this year, but due to family issues, she did not present for evaluation at that time.  Prior to this, she reports a history of a puppy bite, which subsequently resulted in her having significant swelling throughout the leg.  At this point in time, she is not having much pain in her left knee.  She has refused x-rays in the recent past, and subsequently underwent an MRI of the left knee.  She nothing medications for her knee.  She states that another provider recommended she work  with a physical therapist, but she has not done this.  She notes increased swelling throughout the left leg when she is on her feet for extended periods of time.   Chief Complaint  Patient presents with  . Knee Pain    left knee worse than right. no injury,     Review of Systems: No fevers or chills No numbness or tingling No chest pain No shortness of breath No bowel or bladder dysfunction No GI distress No headaches   Medical History:  Past Medical History:  Diagnosis Date  . Anxiety    Panic attacks; somatization  . Bipolar disorder ([place])   . 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 Endometrial polyp 09/01/2019   Pt aware, make appt with Dr Glo Herring,   . Fibroids 09/01/2019  . Hypertension   . Ovarian cyst    cervical polyp  . Palpitations    possible bradycardia  . Somatization disorder     Past Surgical History:  Procedure Laterality Date  . None  05-22-12   hx of ovarian cysts    Family History  Problem Relation Age of Onset  . Cancer Father   . Hypertension Mother   . Diabetes Mother   . Heart failure Other   . Colon cancer Neg Hx    Social History   Tobacco Use  . Smoking status: Never Smoker  . Smokeless  tobacco: Never Used  Vaping Use  . Vaping Use: Never used  Substance Use Topics  . Alcohol use: No  . Drug use: No    Allergies  Allergen Reactions  . Latex Itching    Current Meds  Medication Sig  . diltiazem (TIAZAC) 180 MG 24 hr capsule Take 180 mg by mouth.   . Ferrous Sulfate (IRON PO) Take by mouth daily.  . furosemide (LASIX) 20 MG tablet Take 20 mg by mouth daily.    Objective: There were no vitals taken for this visit.  Physical Exam:  General: Alert and oriented, no acute distress.  Speaks very loud, rambling thoughts. Gait: Normal  Evaluation of the left lower extremity demonstrates some mild edema from her left knee distal to her ankle.  There are no obvious skin lesions.  She has full and painless range of motion of the  left knee.  Mild tenderness palpation along the medial joint line.  Negative Lachman.  No effusion is appreciated.    IMAGING: I personally reviewed images previously obtained in clinic   MRI of the left knee was reviewed in clinic today and demonstrates a small, nondisplaced tear of the medial meniscus.  There is also increased signal within the MCL.   New Medications:  No orders of the defined types were placed in this encounter.     Mordecai Rasmussen, MD  08/21/2020 12:13 PM

## 2020-09-20 ENCOUNTER — Encounter: Payer: Self-pay | Admitting: Internal Medicine

## 2020-10-11 ENCOUNTER — Ambulatory Visit: Payer: Medicaid Other | Admitting: Orthopaedic Surgery

## 2020-10-11 ENCOUNTER — Other Ambulatory Visit: Payer: Self-pay

## 2020-10-30 ENCOUNTER — Ambulatory Visit: Payer: Medicaid Other | Admitting: Gastroenterology

## 2020-10-31 ENCOUNTER — Encounter: Payer: Self-pay | Admitting: Internal Medicine

## 2020-11-06 ENCOUNTER — Other Ambulatory Visit: Payer: Medicaid Other | Admitting: Women's Health

## 2020-11-14 ENCOUNTER — Other Ambulatory Visit: Payer: Medicaid Other | Admitting: Adult Health

## 2020-11-15 ENCOUNTER — Other Ambulatory Visit: Payer: Medicaid Other

## 2020-11-15 DIAGNOSIS — Z20822 Contact with and (suspected) exposure to covid-19: Secondary | ICD-10-CM

## 2020-11-16 LAB — SARS-COV-2, NAA 2 DAY TAT

## 2020-11-16 LAB — SPECIMEN STATUS REPORT

## 2020-11-16 LAB — NOVEL CORONAVIRUS, NAA: SARS-CoV-2, NAA: NOT DETECTED

## 2020-11-19 ENCOUNTER — Telehealth: Payer: Self-pay | Admitting: *Deleted

## 2020-11-19 NOTE — Telephone Encounter (Signed)
Pt notified of negative COVID-19 results. Understanding verbalized.

## 2020-12-05 ENCOUNTER — Ambulatory Visit: Payer: Medicaid Other | Admitting: Gastroenterology

## 2020-12-11 ENCOUNTER — Ambulatory Visit: Payer: Medicaid Other | Admitting: Gastroenterology

## 2020-12-25 ENCOUNTER — Ambulatory Visit (INDEPENDENT_AMBULATORY_CARE_PROVIDER_SITE_OTHER): Payer: Medicaid Other | Admitting: Women's Health

## 2020-12-25 ENCOUNTER — Other Ambulatory Visit: Payer: Self-pay

## 2020-12-25 ENCOUNTER — Other Ambulatory Visit (HOSPITAL_COMMUNITY)
Admission: RE | Admit: 2020-12-25 | Discharge: 2020-12-25 | Disposition: A | Discharge status: Home / Self Care | Payer: Medicaid Other | Source: Ambulatory Visit | Attending: Adult Health | Admitting: Adult Health

## 2020-12-25 ENCOUNTER — Encounter: Payer: Self-pay | Admitting: Women's Health

## 2020-12-25 VITALS — BP 143/88 | HR 92 | Ht 65.0 in | Wt 198.0 lb

## 2020-12-25 DIAGNOSIS — Z01419 Encounter for gynecological examination (general) (routine) without abnormal findings: Secondary | ICD-10-CM | POA: Insufficient documentation

## 2020-12-25 NOTE — Patient Instructions (Signed)
Call the [facility] doctor to schedule an appointment as soon as possible Call the [facility] in [place] to schedule a mammogram

## 2020-12-25 NOTE — Progress Notes (Signed)
WELL-WOMAN EXAMINATION Patient name: Kayla [name] MRN [id number]  Date of birth: [birth year] Chief Complaint:   Gynecologic Exam (Pap smear 12-14-19 ASCUS -HPV)  History of Present Illness:   Kayla [name] is a 50 y.o. [id number] Caucasian female being seen today for a routine well-woman exam.  Current complaints: blood in stools, was talking to satanic man and he said a satanic prayer over her, and she thinks it has caused the blood in stools and migraines. Was supposed to have EGD and TCS in past, but backed out d/t being scared of sedation, etc. Has established GI MD.   Depression screen [facility] 2/9 12/25/2020 11/03/2019 11/03/2019 08/24/2019  Decreased Interest 0 0 0 0  Down, Depressed, Hopeless 0 0 0 0  PHQ - 2 Score 0 0 0 0  Altered sleeping 0 0 - -  Tired, decreased energy 1 0 - -  Change in appetite 1 0 - -  Feeling bad or failure about yourself  0 1 - -  Trouble concentrating 0 0 - -  Moving slowly or fidgety/restless 0 3 - -  Suicidal thoughts 0 0 - -  PHQ-9 Score 2 4 - -  Difficult doing work/chores Not difficult at all Not difficult at all - -     PCP: [facility]      Patient's last menstrual period was 12/11/2020. The current method of family planning is abstinence.  Last pap 10/27/19. Results were: ASCUS w/ HRHPV negative. H/O abnormal pap: yes Last mammogram: said she had one years ago at [facility], but got upset w/ mammographer b/c she was squishing breasts to hard. Results were: unsure- nothing in EPIC. Family h/o breast cancer: yes 1st cousin on mom's side Last colonoscopy: never. Results were: N/A. Family h/o colorectal cancer: yes cousin on mom's side Review of Systems:   Pertinent items are noted in HPI Denies any headaches, blurred vision, fatigue, shortness of breath, chest pain, abdominal pain, abnormal vaginal discharge/itching/odor/irritation, problems with periods, bowel movements, urination, or intercourse unless otherwise stated above. Pertinent History Reviewed:   Reviewed past medical,surgical, social and family history.  Reviewed problem list, medications and allergies. Physical Assessment:   Vitals:   12/25/20 1106  BP: (!) 143/88  Pulse: 92  Weight: 198 lb (89.8 kg)  Height: 5' 5"  (1.651 m)  Body mass index is 32.95 kg/m.        Physical Examination:   General appearance - no distress  Mental status - alert, oriented to person, place, and time  Psych:  Speech is loud, forced and rapid. Multiple thought processes, skips from one to the other  Skin - warm and dry, normal color, no suspicious lesions noted  Chest - effort normal, all lung fields clear to auscultation bilaterally  Heart - normal rate and regular rhythm  Neck:  midline trachea, no thyromegaly or nodules  Breasts - breasts appear normal, no suspicious masses, no skin or nipple changes or  axillary nodes  Abdomen - soft, nontender, nondistended, no masses or organomegaly  Pelvic - VULVA: normal appearing vulva with no masses, tenderness or lesions  VAGINA: normal appearing vagina with normal color and discharge, no lesions  CERVIX: normal appearing cervix without discharge or lesions, no CMT  Thin prep pap is done w/ HR HPV cotesting  UTERUS: uterus is felt to be normal size, shape, consistency and nontender   ADNEXA: No adnexal masses or tenderness noted.  Extremities:  No swelling or varicosities noted  Chaperone: Celene Squibb  No results found for this or any previous visit (from the past 24 hour(s)).  Assessment & Plan:  1) Well-Woman Exam  2) H/O abnormal pap  3) Reported rectal bleeding> call her GI MD today to get appt ASAP, needs TCS (49yo)  Labs/procedures today: pap  Mammogram: call the [facility] in [place] today to schedule screening mammo Colonoscopy: call GI MD today to schedule  No orders of the defined types were placed in this encounter.   Meds: No orders of the defined types were placed in this encounter.   Follow-up: Return in about 1  year (around 12/25/2021) for Physical.  [place], [facility] 12/25/2020 11:58 AM

## 2020-12-28 LAB — CYTOLOGY - PAP
Chlamydia: NEGATIVE
Comment: NEGATIVE
Comment: NEGATIVE
Comment: NORMAL
Diagnosis: NEGATIVE
Diagnosis: REACTIVE
High risk HPV: NEGATIVE
Neisseria Gonorrhea: NEGATIVE

## 2021-01-04 ENCOUNTER — Telehealth: Payer: Self-pay | Admitting: Adult Health

## 2021-01-04 NOTE — Telephone Encounter (Signed)
Pt called because she's not sure how we will notify her of her PAP results Please advise & call pt

## 2021-01-04 NOTE — Telephone Encounter (Signed)
Returned pt's call, no answer, left msg.

## 2021-01-20 IMAGING — MR MR KNEE*L* W/O CM
6 series · 39 of 40 positions shown · non-contrast
Comparison: X-ray 02/19/2017

CLINICAL DATA: Left knee pain.  No known injury.

EXAM:
MRI OF THE LEFT KNEE WITHOUT CONTRAST
TECHNIQUE: Multiplanar, multisequence MR imaging of the knee was performed. No
intravenous contrast was administered.

[Series 5: T2 fat-sat · axial · left · 4.0mm · 0.53mm/px · z∈[-102,+47]mm · 8 of 35 slices shown (1 of 3)]
[im 1/35]
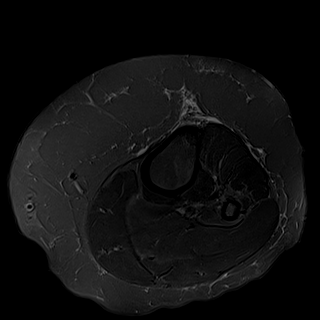
[im 5/35]
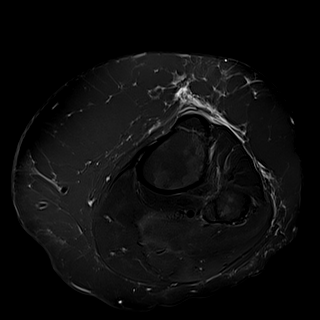
[im 10/35]
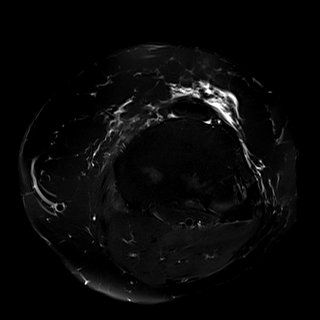
[im 15/35]
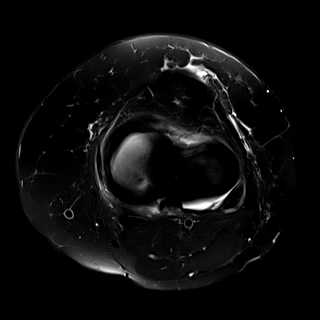
[im 20/35]
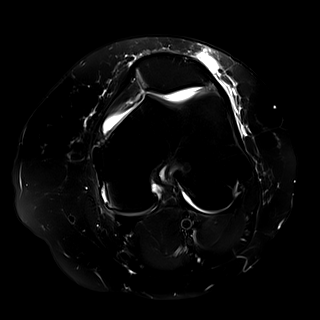
[im 25/35]
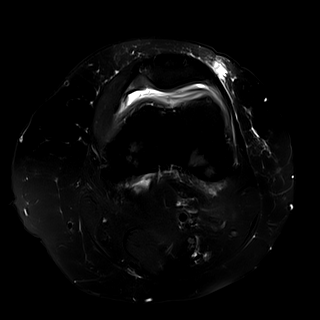
[im 30/35]
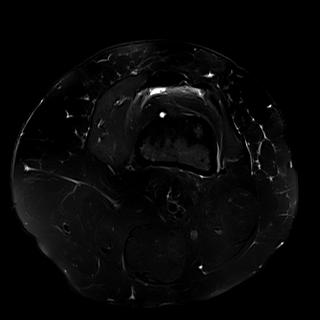
[im 35/35]
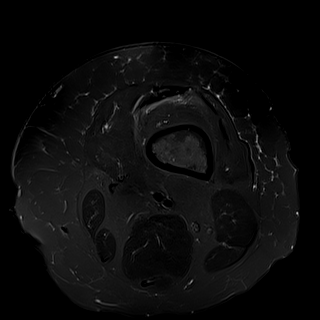

[Series 6: T1 · coronal · left · 4.0mm · 0.31mm/px · 5 of 27 slices shown]
[im 1/27]
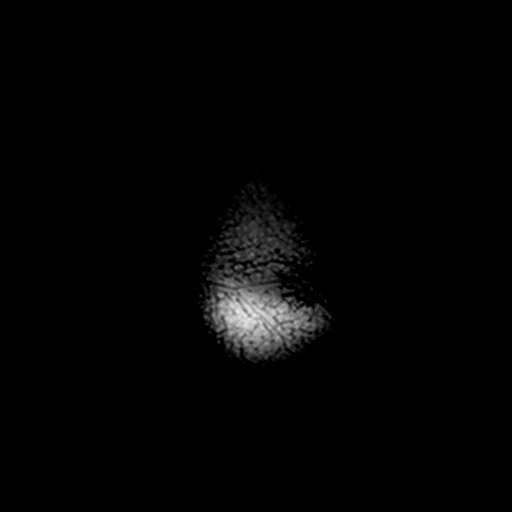
[im 6/27]
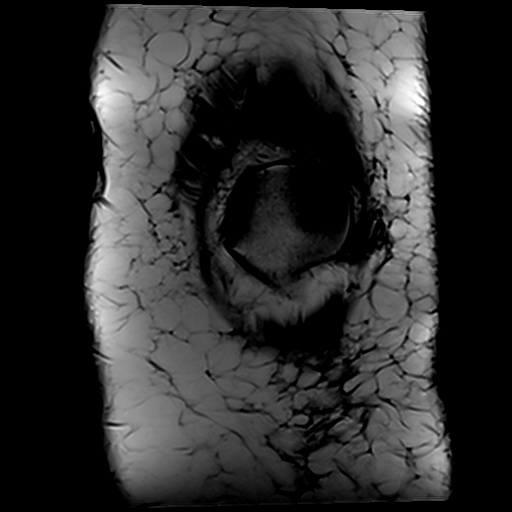
[im 11/27]
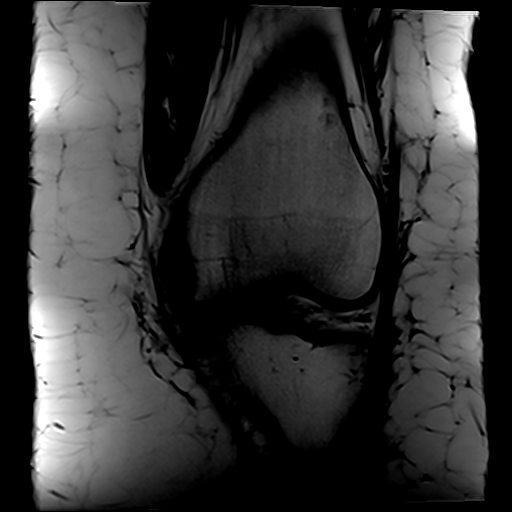
[im 16/27]
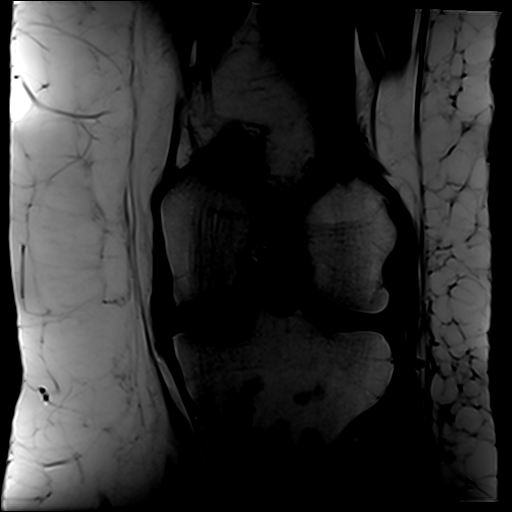
[im 21/27]
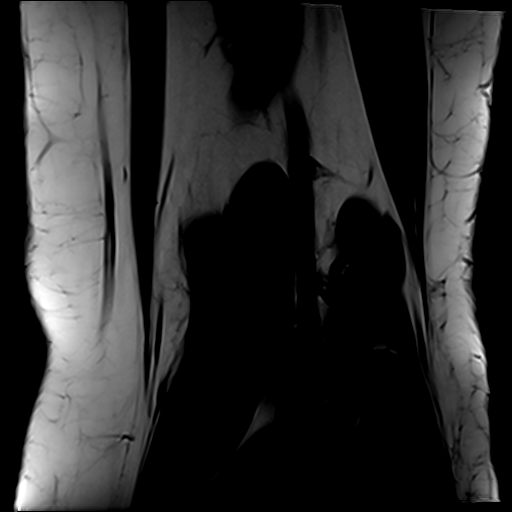

[Series 7: T2 fat-sat · coronal · left · 4.0mm · 0.62mm/px · 6 of 27 slices shown (2 of 3)]
[im 1/27]
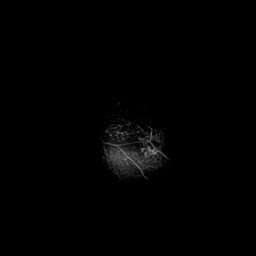
[im 6/27]
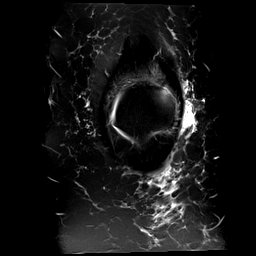
[im 11/27]
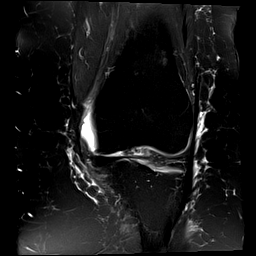
[im 16/27]
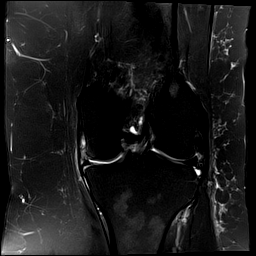
[im 21/27]
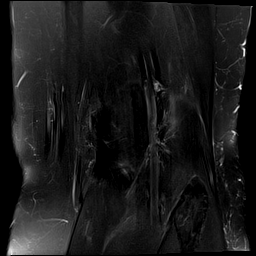
[im 27/27]
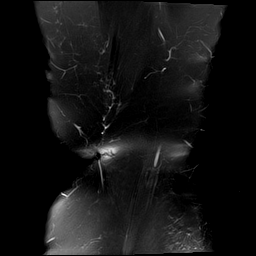

[Series 8: PD fat-sat · coronal · left · 3.0mm · 0.50mm/px · 8 of 37 slices shown (1 of 2)]
[im 1/37]
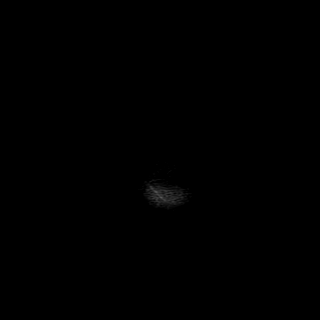
[im 6/37]
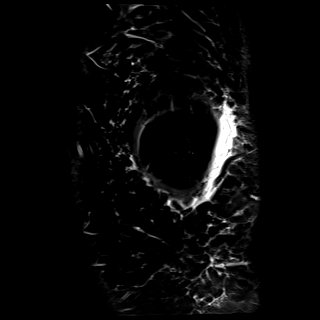
[im 11/37]
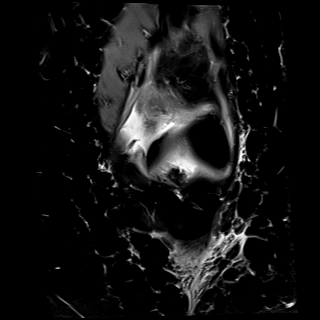
[im 16/37]
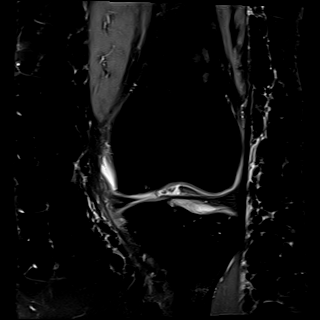
[im 21/37]
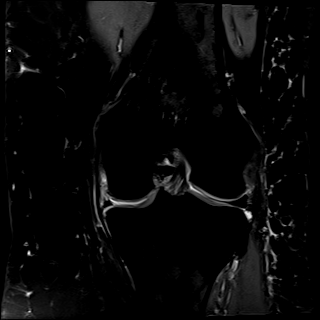
[im 26/37]
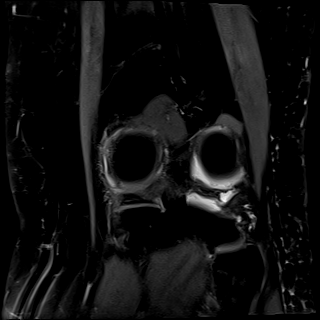
[im 31/37]
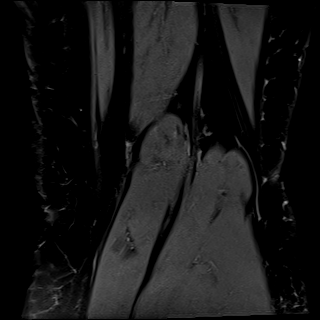
[im 37/37]
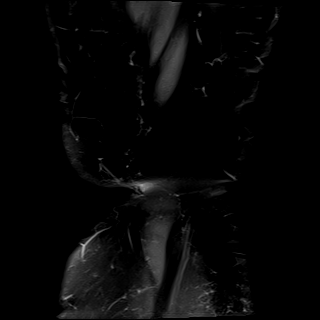

[Series 9: PD fat-sat · sagittal · left · 4.0mm · 0.50mm/px · 6 of 27 slices shown (2 of 2)]
[im 1/27]
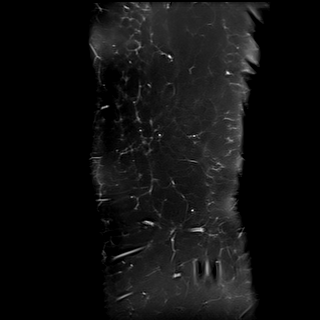
[im 6/27]
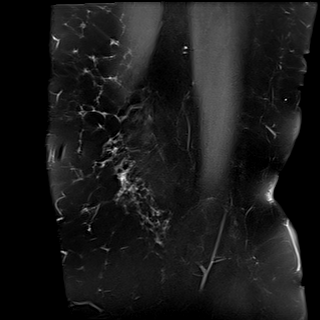
[im 11/27]
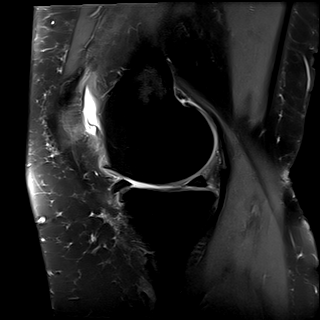
[im 16/27]
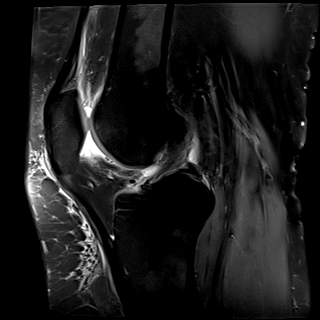
[im 21/27]
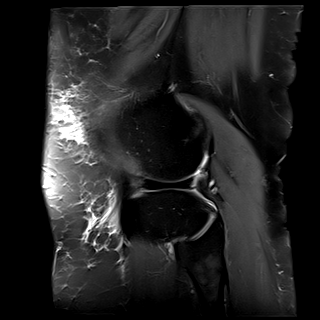
[im 27/27]
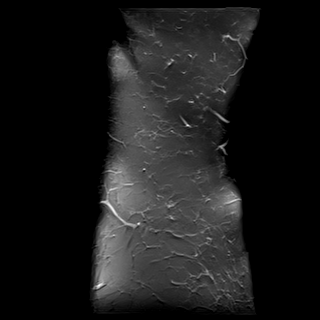

[Series 10: T2 fat-sat · sagittal · left · 4.0mm · 0.50mm/px · 6 of 27 slices shown (3 of 3)]
[im 1/27]
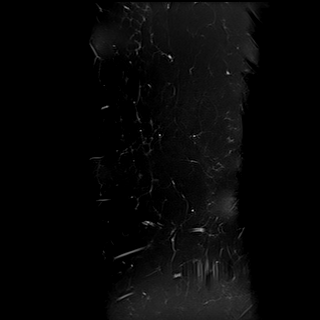
[im 6/27]
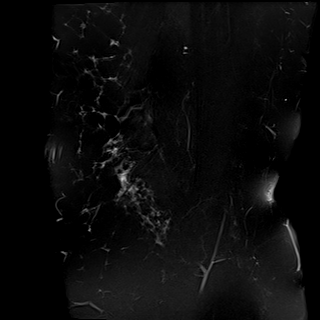
[im 11/27]
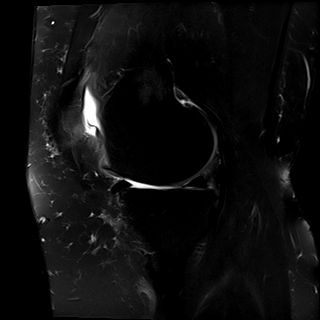
[im 16/27]
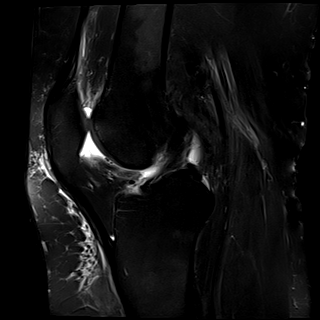
[im 21/27]
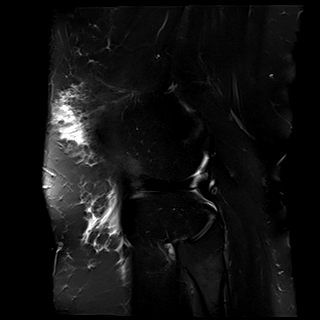
[im 27/27]
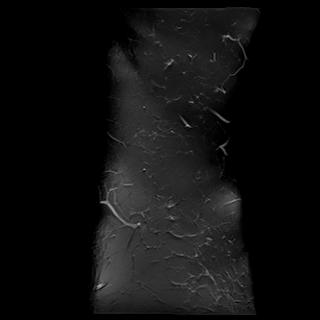

[39 of 40 positions shown; findings below may reference images not displayed]

FINDINGS: MENISCI

Medial meniscus: Intrasubstance degeneration with mild fraying of
the free edge of the midbody. Medial meniscus otherwise intact.

Lateral meniscus:  Intact.

LIGAMENTS

Cruciates:  Intact ACL and PCL.

Collaterals: Intact MCL with mild periligamentous edema. Lateral
collateral ligament complex intact.

CARTILAGE

Patellofemoral:  No chondral defect.

Medial: Mild partial-thickness cartilage loss of the medial femoral
condyle.

Lateral:  No chondral defect.

Joint:  Small joint effusion.  Mild edema within Hoffa's fat.

Popliteal Fossa:  No Baker cyst. Intact popliteus tendon.

Extensor Mechanism: Intact quadriceps tendon and patellar tendon.
Mild distal patellar tendinosis.

Bones: No focal marrow signal abnormality. No fracture or
dislocation.

Other: Nonspecific prepatellar subcutaneous edema.
IMPRESSION: 1. Intrasubstance degeneration with mild fraying of the free edge of
the midbody of the medial meniscus.
2. Intact MCL with mild periligamentous edema which may be reactive
or reflect a grade 1 MCL sprain.
3. Small joint effusion.
4. Mild distal patellar tendinosis.

## 2021-09-09 ENCOUNTER — Ambulatory Visit
Admission: EM | Admit: 2021-09-09 | Discharge: 2021-09-09 | Disposition: A | Discharge status: Home / Self Care | Payer: Medicaid Other | Attending: Family Medicine | Admitting: Family Medicine

## 2021-09-09 ENCOUNTER — Other Ambulatory Visit: Payer: Self-pay

## 2021-09-09 ENCOUNTER — Encounter: Payer: Self-pay | Admitting: Emergency Medicine

## 2021-09-09 DIAGNOSIS — Z20828 Contact with and (suspected) exposure to other viral communicable diseases: Secondary | ICD-10-CM

## 2021-09-09 DIAGNOSIS — R52 Pain, unspecified: Secondary | ICD-10-CM | POA: Diagnosis not present

## 2021-09-09 DIAGNOSIS — J069 Acute upper respiratory infection, unspecified: Secondary | ICD-10-CM | POA: Diagnosis not present

## 2021-09-09 MED ORDER — GUAIFENESIN ER 600 MG PO TB12: 600.0000 mg | ORAL_TABLET | Freq: Two times a day (BID) | ORAL | 0 refills | Status: DC | PRN

## 2021-09-09 MED ORDER — OSELTAMIVIR PHOSPHATE 75 MG PO CAPS: 75.0000 mg | ORAL_CAPSULE | Freq: Two times a day (BID) | ORAL | 0 refills | Status: DC

## 2021-09-09 MED ORDER — FLUTICASONE PROPIONATE 50 MCG/ACT NA SUSP: 1.0000 | Freq: Two times a day (BID) | NASAL | 2 refills | Status: DC

## 2021-09-09 NOTE — ED Triage Notes (Signed)
Pt presents with body aches, cough, sore throat Xs 2 days. States was exposed to the flu.

## 2021-09-09 NOTE — ED Provider Notes (Signed)
[place]    CSN: [id number] Arrival date & time: 09/09/21  1729      History   Chief Complaint Chief Complaint  Patient presents with   Generalized Body Aches   Cough   Sore Throat   Chills   Otalgia    left    HPI Kayla [name] is a 50 y.o. female.   Presenting today with 2-day history of body aches, cough, sore throat, fatigue, congestion.  Denies chest pain, shortness of breath, abdominal pain, nausea vomiting or diarrhea.  So far taking over-the-counter cold and congestion medications with minimal relief.  Multiple sick contacts recently with influenza.  Denies any known chronic pulmonary disease.   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/03/2019   Dysphagia 09/16/2019   Gastroesophageal reflux disease 09/16/2019   Abdominal pain 09/16/2019   Fibroids 09/01/2019   Endometrial polyp 09/01/2019   Swelling of both ankles 08/24/2019   Abdominal bloating 08/24/2019   Pelvic pain 08/24/2019   Menorrhagia with regular cycle 08/24/2019   Overdose of antipsychotic with dilated pupils and possible dystonia affecting eye muscles 06/21/2012   Hyperglycemia 06/21/2012   Hyponatremia 06/21/2012   QT prolongation with abnormal EKG 06/21/2012   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) 03/18/2012   Hematoma 12/11/2011   Swelling of joint of pelvic region or thigh 12/11/2011   Laboratory test 12/01/2011   Ovarian cyst    Anxiety    Palpitations     Past Surgical History:  Procedure Laterality Date   None  05-22-12   hx of ovarian cysts    OB History     Gravida  3   Para   3   Term  3   Preterm      AB      Living  3      SAB      IAB      Ectopic      Multiple      Live Births               Home Medications    Prior to Admission medications   Medication Sig Start Date End Date Taking? Authorizing Provider  fluticasone (FLONASE) 50 MCG/ACT nasal spray Place 1 spray into both nostrils 2 (two) times daily. 09/09/21  Yes Volney American, PA-C  guaiFENesin (MUCINEX) 600 MG 12 hr tablet Take 1 tablet (600 mg total) by mouth 2 (two) times daily as needed. 09/09/21  Yes Volney American, PA-C  oseltamivir (TAMIFLU) 75 MG capsule Take 1 capsule (75 mg total) by mouth every 12 (twelve) hours. 09/09/21  Yes Volney American, PA-C  cholecalciferol (VITAMIN D3) 25 MCG (1000 UNIT) tablet Take 1,000 Units by mouth daily.    [provider]  diltiazem (TIAZAC) 180 MG 24 hr capsule Take 180 mg by mouth.  Patient not taking: Reported on 12/25/2020    [provider]  Ferrous Sulfate (IRON PO) Take by mouth daily.    [provider]  furosemide (LASIX) 20 MG tablet Take 20  mg by mouth daily.    [provider]    Family History Family History  Problem Relation Age of Onset   Cancer Father    Hypertension Mother    Diabetes Mother    Heart failure Other    Colon cancer Neg Hx     Social History Social History   Tobacco Use   Smoking status: Never   Smokeless tobacco: Never  Vaping Use   Vaping Use: Never used  Substance Use Topics   Alcohol use: No   Drug use: No     Allergies   Latex   Review of Systems Review of Systems Per HPI  Physical Exam Triage Vital Signs ED Triage Vitals  Enc Vitals Group     BP 09/09/21 1936 122/80     Pulse Rate 09/09/21 1936 (!) 107     Resp 09/09/21 1936 17     Temp 09/09/21 1936 98.6 F (37 C)     Temp Source 09/09/21 1936 Oral     SpO2 09/09/21 1936 96 %     Weight --      Height --      Head Circumference --      Peak Flow --       Pain Score 09/09/21 1934 7     Pain Loc --      Pain Edu? --      Excl. in [place]? --    No data found.  Updated Vital Signs BP 122/80 (BP Location: Right Arm)   Pulse (!) 107   Temp 98.6 F (37 C) (Oral)   Resp 17   LMP 08/19/2021   SpO2 96%   Visual Acuity Right Eye Distance:   Left Eye Distance:   Bilateral Distance:    Right Eye Near:   Left Eye Near:    Bilateral Near:     Physical Exam Vitals and nursing note reviewed.  Constitutional:      Appearance: Normal appearance. She is not ill-appearing.  HENT:     Head: Atraumatic.     Right Ear: Tympanic membrane normal.     Left Ear: Tympanic membrane normal.     Nose: Rhinorrhea present.     Mouth/Throat:     Mouth: Mucous membranes are moist.     Pharynx: Posterior oropharyngeal erythema present. No oropharyngeal exudate.  Eyes:     Extraocular Movements: Extraocular movements intact.     Conjunctiva/sclera: Conjunctivae normal.  Cardiovascular:     Rate and Rhythm: Normal rate and regular rhythm.     Heart sounds: Normal heart sounds.  Pulmonary:     Effort: Pulmonary effort is normal. No respiratory distress.     Breath sounds: Normal breath sounds. No wheezing or rales.  Abdominal:     General: Bowel sounds are normal. There is no distension.     Palpations: Abdomen is soft.     Tenderness: There is no abdominal tenderness. There is no guarding.  Musculoskeletal:        General: Normal range of motion.     Cervical back: Normal range of motion and neck supple.  Skin:    General: Skin is warm and dry.  Neurological:     Mental Status: She is alert and oriented to person, place, and time.     Motor: No weakness.     Gait: Gait normal.  Psychiatric:        Mood and Affect: Mood normal.        Thought Content:  Thought content normal.        Judgment: Judgment normal.     UC Treatments / Results  Labs (all labs ordered are listed, but only abnormal results are displayed) Labs Reviewed  COVID-19, FLU  A+B NAA    EKG   Radiology No results found.  Procedures Procedures (including critical care time)  Medications Ordered in UC Medications - No data to display  Initial Impression / Assessment and Plan / UC Course  I have reviewed the triage vital signs and the nursing notes.  Pertinent labs & imaging results that were available during my care of the patient were reviewed by me and considered in my medical decision making (see chart for details).     Mildly tachycardic in triage, otherwise vital signs benign and reassuring.  Strong suspicion for influenza based on exposures and symptoms.  We will start Tamiflu, Mucinex, Flonase and discussed supportive over-the-counter medications and home care.  COVID and flu testing pending, return for worsening symptoms.  Final Clinical Impressions(s) / UC Diagnoses   Final diagnoses:  Generalized body aches  Viral URI with cough  Exposure to influenza   Discharge Instructions   None    ED Prescriptions     Medication Sig Dispense Auth. Provider   oseltamivir (TAMIFLU) 75 MG capsule Take 1 capsule (75 mg total) by mouth every 12 (twelve) hours. 10 capsule Volney American, PA-C   guaiFENesin (MUCINEX) 600 MG 12 hr tablet Take 1 tablet (600 mg total) by mouth 2 (two) times daily as needed. 30 tablet Volney American, PA-C   fluticasone [facility]) 50 MCG/ACT nasal spray Place 1 spray into both nostrils 2 (two) times daily. 16 g Volney American, [place]      PDMP not reviewed this encounter.   Merrie Roof Pel[place], [place] 09/10/21 [phone]

## 2021-09-10 LAB — COVID-19, FLU A+B NAA
Influenza A, NAA: NOT DETECTED
Influenza B, NAA: NOT DETECTED
SARS-CoV-2, NAA: NOT DETECTED

## 2021-11-25 ENCOUNTER — Ambulatory Visit: Payer: Medicaid Other | Admitting: Adult Health

## 2022-01-11 ENCOUNTER — Ambulatory Visit
Admission: EM | Admit: 2022-01-11 | Discharge: 2022-01-11 | Disposition: A | Discharge status: Home / Self Care | Payer: Medicaid Other

## 2022-01-11 DIAGNOSIS — R52 Pain, unspecified: Secondary | ICD-10-CM

## 2022-01-11 DIAGNOSIS — M2550 Pain in unspecified joint: Secondary | ICD-10-CM | POA: Diagnosis not present

## 2022-01-11 DIAGNOSIS — J309 Allergic rhinitis, unspecified: Secondary | ICD-10-CM | POA: Diagnosis not present

## 2022-01-11 DIAGNOSIS — W57XXXA Bitten or stung by nonvenomous insect and other nonvenomous arthropods, initial encounter: Secondary | ICD-10-CM

## 2022-01-11 DIAGNOSIS — J019 Acute sinusitis, unspecified: Secondary | ICD-10-CM | POA: Diagnosis not present

## 2022-01-11 DIAGNOSIS — R197 Diarrhea, unspecified: Secondary | ICD-10-CM

## 2022-01-11 DIAGNOSIS — R0982 Postnasal drip: Secondary | ICD-10-CM

## 2022-01-11 DIAGNOSIS — R052 Subacute cough: Secondary | ICD-10-CM

## 2022-01-11 LAB — POCT URINALYSIS DIP (MANUAL ENTRY)
Bilirubin, UA: NEGATIVE
Blood, UA: NEGATIVE
Glucose, UA: NEGATIVE mg/dL
Ketones, POC UA: NEGATIVE mg/dL
Leukocytes, UA: NEGATIVE
Nitrite, UA: NEGATIVE
Protein Ur, POC: NEGATIVE mg/dL
Spec Grav, UA: >=1.03 — AB (ref 1.010–1.025)
Urobilinogen, UA: 0.2 E.U./dL
pH, UA: 5 (ref 5.0–8.0)

## 2022-01-11 MED ORDER — LEVOCETIRIZINE DIHYDROCHLORIDE 5 MG PO TABS: 5.0000 mg | ORAL_TABLET | Freq: Every evening | ORAL | 0 refills | Status: DC

## 2022-01-11 MED ORDER — BENZONATATE 100 MG PO CAPS
100.0000 mg | ORAL_CAPSULE | Freq: Three times a day (TID) | ORAL | 0 refills | Status: DC | PRN
Start: 2022-01-11 — End: 2022-01-29

## 2022-01-11 MED ORDER — DOXYCYCLINE HYCLATE 100 MG PO CAPS: 100.0000 mg | ORAL_CAPSULE | Freq: Two times a day (BID) | ORAL | 0 refills | Status: DC

## 2022-01-11 NOTE — ED Provider Notes (Signed)
?[place] ? ? ?MRN: [id number] DOB: 05/17/1971 ? ?Subjective:  ? ?Kayla [name] is a 51 y.o. female presenting for several month history of persistent coughing, drainage, congestion, joint pains.  When she was seen in November and had negative testing for COVID and flu.  She has multiple other concerns including 2 tick bites that have been this year which were followed by more joint pains, aching of her major muscle groups, radiating pain from her left hand to the neck, diarrhea.  She does not have a significant cardiac history but is followed closely by her cardiologist.  No active chest pain.  Patient does not have any history of GI disorders. No shob, wheezing.  No history of asthma.  Patient is not a smoker.  No drug use, no alcohol use. ? ?No current facility-administered medications for this encounter. ? ?Current Outpatient Medications:  ?  aspirin 81 MG chewable tablet, Chew by mouth daily., Disp: , Rfl:  ?  cholecalciferol (VITAMIN D3) 25 MCG (1000 UNIT) tablet, Take 1,000 Units by mouth daily., Disp: , Rfl:  ?  diltiazem (TIAZAC) 180 MG 24 hr capsule, Take 180 mg by mouth.  (Patient not taking: Reported on 12/25/2020), Disp: , Rfl:  ?  Ferrous Sulfate (IRON PO), Take by mouth daily., Disp: , Rfl:  ?  fluticasone (FLONASE) 50 MCG/ACT nasal spray, Place 1 spray into both nostrils 2 (two) times daily., Disp: 16 g, Rfl: 2 ?  furosemide (LASIX) 20 MG tablet, Take 20 mg by mouth daily., Disp: , Rfl:  ?  guaiFENesin (MUCINEX) 600 MG 12 hr tablet, Take 1 tablet (600 mg total) by mouth 2 (two) times daily as needed., Disp: 30 tablet, Rfl: 0 ?  oseltamivir (TAMIFLU) 75 MG capsule, Take 1 capsule (75 mg total) by mouth every 12 (twelve) hours., Disp: 10 capsule, Rfl: 0  ? ?Allergies  ?Allergen Reactions  ? Latex Itching and Rash  ? ? ?Past Medical History:  ?Diagnosis Date  ? Anxiety   ? Panic attacks; somatization  ? Bipolar disorder ([place])   ? 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)   ? Endometrial polyp  09/01/2019  ? Pt aware, make appt with Dr Glo Herring,   ? Fibroids 09/01/2019  ? Hypertension   ? Ovarian cyst   ? cervical polyp  ? Palpitations   ? possible bradycardia  ? Somatization disorder   ?  ? ?Past Surgical History:  ?Procedure Laterality Date  ? None  05-22-12  ? hx of ovarian cysts  ? ? ?Family History  ?Problem Relation Age of Onset  ? Cancer Father   ? Hypertension Mother   ? Diabetes Mother   ? Heart failure Other   ? Colon cancer Neg Hx   ? ? ?Social History  ? ?Tobacco Use  ? Smoking status: Never  ? Smokeless tobacco: Never  ?Vaping Use  ? Vaping Use: Never used  ?Substance Use Topics  ? Alcohol use: No  ? Drug use: No  ? ? ?ROS ? ? ?Objective:  ? ?Vitals: ?BP (!) 141/71 (BP Location: Right Arm)   Pulse 92   Temp 98.3 ?F (36.8 ?C) (Oral)   Resp 18   LMP  (Within Weeks) Comment: 3 1/2 week  SpO2 94%  ? ?Physical Exam ?Constitutional:   ?   General: She is not in acute distress. ?   Appearance: Normal appearance. She is well-developed and normal weight. She is not ill-appearing, toxic-appearing or diaphoretic.  ?HENT:  ?   Head: Normocephalic and atraumatic.  ?  Right Ear: Tympanic membrane, ear canal and external ear normal. No drainage or tenderness. No middle ear effusion. There is no impacted cerumen. Tympanic membrane is not erythematous.  ?   Left Ear: Tympanic membrane, ear canal and external ear normal. No drainage or tenderness.  No middle ear effusion. There is no impacted cerumen. Tympanic membrane is not erythematous.  ?   Nose: Nose normal. No congestion or rhinorrhea.  ?   Mouth/Throat:  ?   Mouth: Mucous membranes are moist. No oral lesions.  ?   Pharynx: Posterior oropharyngeal erythema (with associated post-nasal drainage) present. No pharyngeal swelling, oropharyngeal exudate or uvula swelling.  ?   Tonsils: No tonsillar exudate or tonsillar abscesses.  ?Eyes:  ?   General: No scleral icterus.    ?   Right eye: No discharge.     ?   Left eye: No discharge.  ?   Extraocular  Movements: Extraocular movements intact.  ?   Right eye: Normal extraocular motion.  ?   Left eye: Normal extraocular motion.  ?   Conjunctiva/sclera: Conjunctivae normal.  ?Cardiovascular:  ?   Rate and Rhythm: Normal rate.  ?   Heart sounds: No murmur heard. ?  No friction rub. No gallop.  ?Pulmonary:  ?   Effort: Pulmonary effort is normal. No respiratory distress.  ?   Breath sounds: No stridor. No wheezing, rhonchi or rales.  ?Chest:  ?   Chest wall: No tenderness.  ?Musculoskeletal:     ?   General: Normal range of motion.  ?   Cervical back: Normal range of motion and neck supple.  ?Lymphadenopathy:  ?   Cervical: No cervical adenopathy.  ?Skin: ?   General: Skin is warm and dry.  ?Neurological:  ?   General: No focal deficit present.  ?   Mental Status: She is alert and oriented to person, place, and time.  ?Psychiatric:     ?   Mood and Affect: Mood is anxious. Mood is not depressed or elated. Affect is not labile, blunt, flat, angry, tearful or inappropriate.     ?   Speech: She is communicative. Speech is rapid and pressured. Speech is not delayed, slurred or tangential.     ?   Behavior: Behavior is not agitated, slowed, aggressive, withdrawn, hyperactive or combative.  ? ? ?Results for orders placed or performed during the hospital encounter of 01/11/22 (from the past 24 hour(s))  ?POCT urinalysis dipstick     Status: Abnormal  ? Collection Time: 01/11/22 10:48 AM  ?Result Value Ref Range  ? Color, UA yellow yellow  ? Clarity, UA cloudy (A) clear  ? Glucose, UA negative negative mg/dL  ? Bilirubin, UA negative negative  ? Ketones, POC UA negative negative mg/dL  ? Spec Grav, UA >=1.030 (A) 1.010 - 1.025  ? Blood, UA negative negative  ? pH, UA 5.0 5.0 - 8.0  ? Protein Ur, POC negative negative mg/dL  ? Urobilinogen, UA 0.2 0.2 or 1.0 E.U./dL  ? Nitrite, UA Negative Negative  ? Leukocytes, UA Negative Negative  ? ? ?Assessment and Plan :  ? ?PDMP not reviewed this encounter. ? ?1. Acute non-recurrent  sinusitis, unspecified location   ?2. Subacute cough   ?3. Body aches   ?4. Arthralgia, unspecified joint   ?5. Tick bite, unspecified site, initial encounter   ?6. Diarrhea, unspecified type   ?7. Post-nasal drainage   ?8. Allergic rhinitis, unspecified seasonality, unspecified trigger   ? ?Will start empiric  treatment for sinusitis with doxycycline.  Recommended supportive care otherwise including the use of oral antihistamine, decongestant. This can also address her concerns for tick borne illness.  Recommended that she follow-up very closely with her PCP as she has multiple concerns and would like to have extensive work-up.  Unfortunately I am not able to do this through the urgent care setting.  She verbalizes understanding and will follow-up with her PCP, cardiologist as soon as possible.  Counseled patient on potential for adverse effects with medications prescribed/recommended today, ER and return-to-clinic precautions discussed, patient verbalized understanding. ? ?  ?Jaynee Eagles, PA-C ?01/11/22 1219 ? ?

## 2022-01-11 NOTE — ED Triage Notes (Addendum)
Pt reports joint pain, cough and mucus since November 2022; diarrhea and tick bite in genitalia x 3-4 days.  ? ?Pt reports burning when urinating for a "while"  ?

## 2022-01-29 ENCOUNTER — Ambulatory Visit (INDEPENDENT_AMBULATORY_CARE_PROVIDER_SITE_OTHER): Payer: Medicaid Other | Admitting: Obstetrics & Gynecology

## 2022-01-29 ENCOUNTER — Other Ambulatory Visit (HOSPITAL_COMMUNITY)
Admission: RE | Admit: 2022-01-29 | Discharge: 2022-01-29 | Disposition: A | Discharge status: Home / Self Care | Payer: Medicaid Other | Source: Ambulatory Visit | Attending: Obstetrics & Gynecology | Admitting: Obstetrics & Gynecology

## 2022-01-29 ENCOUNTER — Encounter: Payer: Self-pay | Admitting: Obstetrics & Gynecology

## 2022-01-29 VITALS — BP 127/75 | HR 103 | Ht 64.5 in | Wt 196.0 lb

## 2022-01-29 DIAGNOSIS — N898 Other specified noninflammatory disorders of vagina: Secondary | ICD-10-CM | POA: Diagnosis present

## 2022-01-29 DIAGNOSIS — N951 Menopausal and female climacteric states: Secondary | ICD-10-CM
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
## 2022-01-30 LAB — CERVICOVAGINAL ANCILLARY ONLY
Bacterial Vaginitis (gardnerella): NEGATIVE
Candida Glabrata: NEGATIVE
Candida Vaginitis: NEGATIVE
Comment: NEGATIVE
Comment: NEGATIVE
Comment: NEGATIVE

## 2022-01-31 ENCOUNTER — Telehealth: Payer: Self-pay | Admitting: *Deleted

## 2022-01-31 NOTE — Telephone Encounter (Signed)
-----   Message from Janyth Pupa, DO sent at 01/31/2022  5:02 AM EDT ----- ?Vaginitis panel negative, no evidence of infection ?

## 2022-01-31 NOTE — Telephone Encounter (Signed)
Left message @ 1:32 pm letting her swab was negative, no evidence of infection. JSY ?

## 2022-02-03 ENCOUNTER — Ambulatory Visit (INDEPENDENT_AMBULATORY_CARE_PROVIDER_SITE_OTHER): Payer: Medicaid Other | Admitting: Gastroenterology

## 2022-02-03 ENCOUNTER — Encounter: Payer: Self-pay | Admitting: Gastroenterology

## 2022-02-03 ENCOUNTER — Telehealth: Payer: Self-pay | Admitting: Gastroenterology

## 2022-02-03 VITALS — BP 120/72 | HR 84 | Temp 98.1°F | Ht 64.0 in | Wt 200.0 lb

## 2022-02-03 DIAGNOSIS — Z1211 Encounter for screening for malignant neoplasm of colon: Secondary | ICD-10-CM | POA: Diagnosis not present

## 2022-02-03 DIAGNOSIS — R197 Diarrhea, unspecified: Secondary | ICD-10-CM | POA: Diagnosis not present

## 2022-02-03 DIAGNOSIS — K760 Fatty (change of) liver, not elsewhere classified: Secondary | ICD-10-CM | POA: Diagnosis not present

## 2022-02-03 DIAGNOSIS — R1310 Dysphagia, unspecified: Secondary | ICD-10-CM

## 2022-02-03 MED ORDER — OMEPRAZOLE 20 MG PO CPDR: 20.0000 mg | DELAYED_RELEASE_CAPSULE | Freq: Every day | ORAL | 3 refills | Status: DC

## 2022-02-03 NOTE — Patient Instructions (Signed)
I am ordering labs for you to have completed at [facility]. I am also ordering an ultrasound of your liver.  We will call you with the date and time for your imaging. ? ?I am sending in omeprazole 20 mg for you to take daily 30 minutes prior to breakfast or your first meal of the day. ? ?As discussed the strongly recommend completing EGD/colonoscopy due to your strong family history of colon cancer.  We will proceed with Cologuard as requested.  If positive this does warrant evaluation with a regular colonoscopy. ? ?I am attaching some handouts for you regarding dietary changes and information about fatty liver disease.  Advise a diet lower in fat content, this will help with your chronic diarrhea.  We can discuss further evaluation of your diarrhea at your next follow-up.  ? ?We will have you follow-up in about 3 months. ? ?It was a pleasure to see you today. I want to create trusting relationships with patients. If you receive a survey regarding your visit,  I greatly appreciate you taking time to fill this out on paper or through your MyChart. I value your feedback. ? ?Venetia Night, MSN, FNP-BC, AGACNP-BC ?[facility] ? ? ?

## 2022-02-03 NOTE — Telephone Encounter (Signed)
Patient called back to let you know she forgot to tell you that she has felt tired the last two years  ?
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
## 2022-02-04 LAB — TSH: TSH: 2.78 u[IU]/mL (ref 0.450–4.500)

## 2022-02-06 ENCOUNTER — Telehealth: Payer: Self-pay | Admitting: Internal Medicine

## 2022-02-06 LAB — MITOCHONDRIAL/SMOOTH MUSCLE AB PNL
Mitochondrial Ab: <20 Units (ref 0.0–20.0)
Smooth Muscle Ab: 4 Units (ref 0–19)

## 2022-02-06 LAB — CBC
Hematocrit: 36.4 % (ref 34.0–46.6)
Hemoglobin: 11.4 g/dL (ref 11.1–15.9)
MCH: 25.7 pg — ABNORMAL LOW (ref 26.6–33.0)
MCHC: 31.3 g/dL — ABNORMAL LOW (ref 31.5–35.7)
MCV: 82 fL (ref 79–97)
Platelets: 265 10*3/uL (ref 150–450)
RBC: 4.43 x10E6/uL (ref 3.77–5.28)
RDW: 15.9 % — ABNORMAL HIGH (ref 11.7–15.4)
WBC: 7.3 10*3/uL (ref 3.4–10.8)

## 2022-02-06 LAB — ANA: ANA Titer 1: NEGATIVE

## 2022-02-06 LAB — COMPREHENSIVE METABOLIC PANEL
ALT: 14 IU/L (ref 0–32)
AST: 16 IU/L (ref 0–40)
Albumin/Globulin Ratio: 1.4 (ref 1.2–2.2)
Albumin: 4.1 g/dL (ref 3.8–4.8)
Alkaline Phosphatase: 100 IU/L (ref 44–121)
BUN/Creatinine Ratio: 19 (ref 9–23)
BUN: 12 mg/dL (ref 6–24)
Bilirubin Total: 0.3 mg/dL (ref 0.0–1.2)
CO2: 25 mmol/L (ref 20–29)
Calcium: 8.9 mg/dL (ref 8.7–10.2)
Chloride: 104 mmol/L (ref 96–106)
Creatinine, Ser: 0.64 mg/dL (ref 0.57–1.00)
Globulin, Total: 2.9 g/dL (ref 1.5–4.5)
Glucose: 89 mg/dL (ref 70–99)
Potassium: 4.3 mmol/L (ref 3.5–5.2)
Sodium: 141 mmol/L (ref 134–144)
Total Protein: 7 g/dL (ref 6.0–8.5)
eGFR: 108 mL/min/{1.73_m2} (ref >59)

## 2022-02-06 LAB — ACUTE VIRAL HEPATITIS (HAV, HBV, HCV)
HCV Ab: NONREACTIVE
Hep A IgM: NEGATIVE
Hep B C IgM: NEGATIVE
Hepatitis B Surface Ag: NEGATIVE

## 2022-02-06 LAB — IGG, IGA, IGM
IgA/Immunoglobulin A, Serum: 209 mg/dL (ref 87–352)
IgG (Immunoglobin G), Serum: 1146 mg/dL (ref 586–1602)
IgM (Immunoglobulin M), Srm: 73 mg/dL (ref 26–217)

## 2022-02-06 LAB — AFP TUMOR MARKER: AFP, Serum, Tumor Marker: 3.6 ng/mL (ref 0.0–6.4)

## 2022-02-06 LAB — HCV INTERPRETATION

## 2022-02-06 NOTE — Telephone Encounter (Signed)
Pt calling for her lab results. [phone] ?

## 2022-02-10 ENCOUNTER — Telehealth: Payer: Self-pay | Admitting: Gastroenterology

## 2022-02-10 NOTE — Telephone Encounter (Signed)
Pt was seen by Kayla Night, NP on 02/03/2022 and I was requested to get u/s and labs from [facility] in [place]. I faxed request and Dawn from [facility] called to say patient hasn't been seen since 2021 and do we still need U/S and labs. Please advise.  ?

## 2022-02-10 NOTE — Telephone Encounter (Signed)
Pt was made aware and verbalized understanding, labs results were mailed out to the patient on 02/07/2022. ?

## 2022-02-10 NOTE — Telephone Encounter (Signed)
Kayla [name] made pt aware of lab results and a copy of results were mailed to her.  ?

## 2022-02-12 ENCOUNTER — Ambulatory Visit (HOSPITAL_COMMUNITY)
Admission: RE | Admit: 2022-02-12 | Discharge: 2022-02-12 | Disposition: A | Discharge status: Home / Self Care | Payer: Medicaid Other | Source: Ambulatory Visit | Attending: Gastroenterology | Admitting: Gastroenterology

## 2022-02-12 DIAGNOSIS — K760 Fatty (change of) liver, not elsewhere classified: Secondary | ICD-10-CM | POA: Insufficient documentation

## 2022-02-18 ENCOUNTER — Encounter: Payer: Self-pay | Admitting: *Deleted

## 2022-02-26 LAB — COLOGUARD: COLOGUARD: NEGATIVE

## 2022-02-27 ENCOUNTER — Telehealth: Payer: Self-pay | Admitting: Internal Medicine

## 2022-02-27 NOTE — Telephone Encounter (Signed)
Pt is wanting to know the results to her recent cologuard. Results are in [place].

## 2022-02-27 NOTE — Telephone Encounter (Signed)
Pt calling for her lab/stool results. Please call [phone]

## 2022-04-12 ENCOUNTER — Ambulatory Visit
Admission: EM | Admit: 2022-04-12 | Discharge: 2022-04-12 | Disposition: A | Discharge status: Home / Self Care | Payer: Medicaid Other

## 2022-04-12 DIAGNOSIS — S61411D Laceration without foreign body of right hand, subsequent encounter: Secondary | ICD-10-CM

## 2022-04-12 DIAGNOSIS — S61411A Laceration without foreign body of right hand, initial encounter: Secondary | ICD-10-CM

## 2022-04-12 NOTE — ED Triage Notes (Signed)
Pt reports she has a gash in the palm of right hand x 1 week. States she slipped over a rock. States she is concern for blood clot as she feels a knot in the hand.

## 2022-04-12 NOTE — ED Provider Notes (Signed)
[facility]    CSN: [id number] Arrival date & time: 04/12/22  1232      History   Chief Complaint Chief Complaint  Patient presents with   Hand Problem    HPI Kayla [name] is a 51 y.o. female.   Presenting today with a laceration to the palm of her right hand that occurred 1 week ago when she slipped in the rain and fell onto a sharp rock.  She states she has been taking care of the wound at home and overall it has been healing without difficulty but she felt a hard knot underneath the laceration and became concerned about a blood clot.  She denies numbness, tingling, discoloration, drainage, bleeding, radiation of pain up into the arm.    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   Dysphagia 09/16/2019   Gastroesophageal reflux disease 09/16/2019   Abdominal pain 09/16/2019   Fibroids 09/01/2019   Endometrial polyp 09/01/2019   Swelling of both ankles 08/24/2019   Abdominal bloating 08/24/2019   Pelvic pain 08/24/2019   Menorrhagia with regular cycle 08/24/2019   Overdose of antipsychotic with dilated pupils and possible dystonia affecting eye muscles 06/21/2012   Hyperglycemia 06/21/2012   Hyponatremia 06/21/2012   QT prolongation with abnormal EKG 06/21/2012   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 Authorizing Provider  aspirin 81 MG chewable tablet Chew by mouth daily.    [provider]  cholecalciferol (VITAMIN D3) 25 MCG (1000 UNIT) tablet Take 1,000 Units by mouth daily.    [provider]  diltiazem (CARDIZEM CD) 120 MG 24 hr capsule Take 120 mg by mouth daily. 11/11/21   [provider]  Ferrous Sulfate (IRON PO) Take by mouth daily.    [provider]  meloxicam (MOBIC) 7.5 MG tablet Take 7.5 mg by mouth daily. 01/28/22   [provider]  metoprolol succinate (TOPROL-XL) 50 MG 24 hr tablet Take 50 mg by mouth daily. 01/28/22   [provider]  omeprazole (PRILOSEC) 20 MG capsule Take 1 capsule (20 mg total) by mouth daily. 02/03/22   Sherron Monday, NP  Family History Family History  Problem Relation Age of Onset   Hypertension Mother    Diabetes Mother    Cancer Father    Colon cancer Brother    Heart failure Other     Social History Social History   Tobacco Use   Smoking status: Never   Smokeless tobacco: Never  Vaping Use   Vaping Use: Never used  Substance Use Topics   Alcohol use: No   Drug use: No     Allergies   Latex   Review of Systems Review of Systems Per HPI  Physical Exam Triage Vital Signs ED Triage Vitals  Enc Vitals Group     BP 04/12/22 1331 (!) 128/44     Pulse Rate 04/12/22 1331 (!) 107     Resp 04/12/22 1331 18     Temp 04/12/22 1331 98.2 F (36.8 C)     Temp Source 04/12/22 1331 Oral     SpO2 04/12/22 1331 98 %     Weight --      Height --      Head Circumference --      Peak Flow --      Pain Score 04/12/22 1334 5     Pain Loc --      Pain Edu? --      Excl. in [place]? --    No data  found.  Updated Vital Signs BP (!) 128/44 (BP Location: Right Arm)   Pulse (!) 107   Temp 98.2 F (36.8 C) (Oral)   Resp 18   LMP  (Within Weeks) Comment: 1 week  SpO2 98%   Visual Acuity Right Eye Distance:   Left Eye Distance:   Bilateral Distance:    Right Eye Near:   Left Eye Near:    Bilateral Near:     Physical Exam Vitals and nursing note reviewed.  Constitutional:      Appearance: Normal appearance. She is not ill-appearing.  HENT:     Head: Atraumatic.  Eyes:     Extraocular Movements: Extraocular movements intact.     Conjunctiva/sclera: Conjunctivae normal.  Cardiovascular:     Rate and Rhythm: Normal rate and regular rhythm.     Heart sounds: Normal heart sounds.  Pulmonary:     Effort: Pulmonary effort is normal.     Breath sounds: Normal breath sounds.  Musculoskeletal:        General: Normal range of motion.     Cervical back: Normal range of motion and neck supple.  Skin:    General: Skin is warm and dry.     Comments: Well-healing laceration to the palm of right hand with slight nodular inflammatory tissue below laceration.  No erythema, fluctuance, induration, drainage or bleeding  Neurological:     Mental Status: She is alert and oriented to person, place, and time.     Comments: Right hand neurovascularly intact  Psychiatric:        Mood and Affect: Mood normal.        Thought Content: Thought content normal.        Judgment: Judgment normal.      UC Treatments / Results  Labs (all labs ordered are listed, but only abnormal results are displayed) Labs Reviewed - No data to display  EKG   Radiology No results found.  Procedures Procedures (including critical care time)  Medications Ordered in UC Medications - No data to display  Initial Impression / Assessment and Plan / UC Course  I  have reviewed the triage vital signs and the nursing notes.  Pertinent labs & imaging results that were available during my care of the patient  were reviewed by me and considered in my medical decision making (see chart for details).     Possibly small hematoma versus inflammatory nodule underneath healing laceration.  No evidence of concerning blood clots in the upper extremity, infection of the laceration or complications with wound healing.  Reassurance given, questions answered.  Final Clinical Impressions(s) / UC Diagnoses   Final diagnoses:  Laceration of right hand without foreign body, initial encounter   Discharge Instructions   None    ED Prescriptions   None    PDMP not reviewed this encounter.   Volney American, [place] 04/12/22 1416

## 2022-05-21 ENCOUNTER — Encounter: Payer: Self-pay | Admitting: Emergency Medicine

## 2022-05-21 ENCOUNTER — Ambulatory Visit (INDEPENDENT_AMBULATORY_CARE_PROVIDER_SITE_OTHER): Payer: Medicaid Other

## 2022-05-21 ENCOUNTER — Other Ambulatory Visit: Payer: Self-pay

## 2022-05-21 ENCOUNTER — Ambulatory Visit
Admission: EM | Admit: 2022-05-21 | Discharge: 2022-05-21 | Disposition: A | Discharge status: Home / Self Care | Payer: Medicaid Other | Attending: Family Medicine | Admitting: Family Medicine

## 2022-05-21 DIAGNOSIS — Z20822 Contact with and (suspected) exposure to covid-19: Secondary | ICD-10-CM | POA: Diagnosis not present

## 2022-05-21 DIAGNOSIS — R059 Cough, unspecified: Secondary | ICD-10-CM

## 2022-05-21 DIAGNOSIS — R062 Wheezing: Secondary | ICD-10-CM | POA: Insufficient documentation

## 2022-05-21 DIAGNOSIS — R0602 Shortness of breath: Secondary | ICD-10-CM | POA: Diagnosis not present

## 2022-05-21 DIAGNOSIS — R051 Acute cough: Secondary | ICD-10-CM | POA: Diagnosis not present

## 2022-05-21 MED ORDER — BENZONATATE 100 MG PO CAPS: ORAL_CAPSULE | ORAL | 0 refills | Status: DC

## 2022-05-21 MED ORDER — PREDNISONE 20 MG PO TABS: 40.0000 mg | ORAL_TABLET | Freq: Every day | ORAL | 0 refills | Status: DC

## 2022-05-21 NOTE — Discharge Instructions (Signed)
You have been tested for COVID-19 today. °If your test returns positive, you will receive a phone call from [place] regarding your results. °Negative test results are not called. °Both positive and negative results area always visible on MyChart. °If you do not have a MyChart account, sign up instructions are provided in your discharge papers. °Please do not hesitate to contact us should you have questions or concerns. ° °

## 2022-05-21 NOTE — ED Triage Notes (Signed)
Pt reports productive cough, sore throat, fever, diarrhea x9 days. Pt reports was exposed to covid around symptoms onset.

## 2022-05-23 LAB — SARS CORONAVIRUS 2 (TAT 6-24 HRS): SARS Coronavirus 2: NEGATIVE

## 2022-05-24 NOTE — ED Provider Notes (Signed)
[place]   [id number] 05/21/22 Arrival Time: [place]:  1. Acute cough   2. Wheezing    No resp distress. Likely resolving viral illness.  I have personally viewed the imaging studies ordered this visit. No signs of PNA on CXR.  COVID negative. OTC symptom care as needed.  Discharge Medication List as of 05/21/2022  7:57 PM     START taking these medications   Details  benzonatate (TESSALON) 100 MG capsule Take 1 capsule by mouth every 8 (eight) hours for cough., Normal    predniSONE (DELTASONE) 20 MG tablet Take 2 tablets (40 mg total) by mouth daily., Starting Wed 05/21/2022, Normal         Follow-up Information     Denyce Robert, FNP.   Specialty: Family Medicine Why: As needed. Contact information: [place] [address] [phone]                 Reviewed expectations re: course of current medical issues. Questions answered. Outlined signs and symptoms indicating need for more acute intervention. Understanding verbalized. After Visit Summary given.   SUBJECTIVE: History from: Patient. Kayla [name] is a 51 y.o. female. Reports: prod cough, ST, subj fever, loose stools; grad onset approx 1-1.5 w ago; overall better. Still coughing with tightness in chest; ques wheezing. Denies: difficulty breathing. Normal PO intake without n/v/d.  OBJECTIVE:  Vitals:   05/21/22 1930  BP: 122/80  Pulse: (!) 101  Resp: 17  Temp: (!) 97.5 F (36.4 C)  TempSrc: Oral  SpO2: 97%    General appearance: alert; no distress Eyes: PERRLA; EOMI; conjunctiva normal HENT: [place]; AT; with nasal congestion Neck: supple  Lungs: speaks full sentences without difficulty; unlabored; mild bilat wheezes Extremities: no edema Skin: warm and dry Neurologic: normal gait Psychological: alert and cooperative; normal mood and affect  Labs: Results for orders placed or performed during the hospital encounter of 05/21/22   SARS CORONAVIRUS 2 (TAT 6-24 HRS) Anterior Nasal Swab   Specimen: Anterior Nasal Swab  Result Value Ref Range   SARS Coronavirus 2 NEGATIVE NEGATIVE   Labs Reviewed  SARS CORONAVIRUS 2 (TAT 6-24 HRS)    Imaging: DG Chest 2 View  Result Date: 05/21/2022 CLINICAL DATA:  Cough, short of breath EXAM: CHEST - 2 VIEW COMPARISON:  12/27/2011 FINDINGS: The heart size and mediastinal contours are within normal limits. Both lungs are clear. The visualized skeletal structures are unremarkable. IMPRESSION: No active cardiopulmonary disease. Electronically Signed   By: Randa Ngo M.D.   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)    Endometrial polyp 09/01/2019   Pt aware, make appt with Dr Glo Herring,    Fibroids 09/01/2019   Hypertension    Ovarian cyst    cervical polyp   Palpitations    possible bradycardia   Somatization disorder    Social History   Socioeconomic History   Marital status: Divorced    Spouse name: Not on file   Number of children: Not on file   Years of education: Not on file   Highest education level: Not on file  Occupational History   Occupation: Unemployed  Tobacco Use   Smoking status: Never   Smokeless tobacco: Never  Vaping Use  Vaping Use: Never used  Substance and Sexual Activity   Alcohol use: No   Drug use: No   Sexual activity: Not Currently    Birth control/protection: None  Other Topics Concern   Not on file  Social History Narrative   Divorced.  Lives with her mother and 3 children.     Social Determinants of Health   Financial Resource Strain: Low Risk  (12/25/2020)   Overall Financial Resource Strain (CARDIA)    Difficulty of Paying Living Expenses: Not very hard  Food Insecurity: Food Insecurity Present (12/25/2020)   Hunger Vital Sign    Worried About  Running Out of Food in the Last Year: Sometimes true    Ran Out of Food in the Last Year: Sometimes true  Transportation Needs: No Transportation Needs (12/25/2020)   PRAPARE - Hydrologist (Medical): No    Lack of Transportation (Non-Medical): No  Physical Activity: Sufficiently Active (12/25/2020)   Exercise Vital Sign    Days of Exercise per Week: 7 days    Minutes of Exercise per Session: 30 min  Stress: Stress Concern Present (12/25/2020)   [place]    Feeling of Stress : To some extent  Social Connections: Socially Isolated (12/25/2020)   Social Connection and Isolation Panel [NHANES]    Frequency of Communication with Friends and Family: More than three times a week    Frequency of Social Gatherings with Friends and Family: Twice a week    Attends Religious Services: Never    Marine scientist or Organizations: No    Attends Archivist Meetings: Never    Marital Status: Divorced  Human resources officer Violence: Not At Risk (12/25/2020)   Humiliation, Afraid, Rape, and Kick questionnaire    Fear of Current or Ex-Partner: No    Emotionally Abused: No    Physically Abused: No    Sexually Abused: No   Family History  Problem Relation Age of Onset   Hypertension Mother    Diabetes Mother    Cancer Father    Colon cancer Brother    Heart failure Other    Past Surgical History:  Procedure Laterality Date   None  05-22-12   hx of ovarian cysts     Vanessa Kick, MD 05/24/22 [phone]

## 2022-07-16 ENCOUNTER — Ambulatory Visit
Admission: EM | Admit: 2022-07-16 | Discharge: 2022-07-16 | Disposition: A | Discharge status: Home / Self Care | Payer: Medicaid Other | Attending: Family Medicine | Admitting: Family Medicine

## 2022-07-16 DIAGNOSIS — R8290 Unspecified abnormal findings in urine: Secondary | ICD-10-CM

## 2022-07-16 DIAGNOSIS — U071 COVID-19: Secondary | ICD-10-CM | POA: Diagnosis not present

## 2022-07-16 DIAGNOSIS — J069 Acute upper respiratory infection, unspecified: Secondary | ICD-10-CM

## 2022-07-16 LAB — POCT URINALYSIS DIP (MANUAL ENTRY)
Bilirubin, UA: NEGATIVE
Blood, UA: NEGATIVE
Glucose, UA: NEGATIVE mg/dL
Ketones, POC UA: NEGATIVE mg/dL
Leukocytes, UA: NEGATIVE
Nitrite, UA: NEGATIVE
Protein Ur, POC: NEGATIVE mg/dL
Spec Grav, UA: >=1.03 — AB (ref 1.010–1.025)
Urobilinogen, UA: 0.2 E.U./dL
pH, UA: 5.5 (ref 5.0–8.0)

## 2022-07-16 LAB — RESP PANEL BY RT-PCR (FLU A&B, COVID) ARPGX2
Influenza A by PCR: NEGATIVE
Influenza B by PCR: NEGATIVE
SARS Coronavirus 2 by RT PCR: POSITIVE — AB

## 2022-07-16 MED ORDER — BENZONATATE 100 MG PO CAPS: 100.0000 mg | ORAL_CAPSULE | Freq: Three times a day (TID) | ORAL | 0 refills | Status: DC

## 2022-07-16 MED ORDER — GUAIFENESIN ER 600 MG PO TB12
600.0000 mg | ORAL_TABLET | Freq: Two times a day (BID) | ORAL | 0 refills | Status: DC | PRN
Start: 2022-07-16 — End: 2022-09-26

## 2022-07-16 NOTE — ED Triage Notes (Signed)
Pt reports cough, fever, chills, diarrhea, body aches x 3 days. Pt reports her son was exposed to [place] and she may have COVID or Flu. Pt has not taken any meds for complaints.   Pt requested COVID and Flu test.   Pt wants her urine to be check as smells like ammonia x 2 months.

## 2022-07-16 NOTE — ED Provider Notes (Signed)
[facility]    CSN: [id number] Arrival date & time: 07/16/22  1827      History   Chief Complaint Chief Complaint  Patient presents with   Cough   Generalized Body Aches        Diarrhea    HPI Kayla [name] is a 51 y.o. female.   Patient presenting today with 3-day history of fever, chills, body aches, diarrhea, cough, congestion, sore throat.  Denies chest pain, shortness of breath, abdominal pain, nausea vomiting or diarrhea.  Recent exposures to COVID-19 and requesting testing.  So far not trying anything over-the-counter for symptoms.  No known history of chronic pulmonary disease per patient.  She is also wanting to check her urine as it has smelled like ammonia off-and-on for the past few months and she read online that could mean there is a an infection.  Denies any vaginal symptoms, abdominal pain, flank pain.    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/03/2019   Dysphagia 09/16/2019   Gastroesophageal reflux disease 09/16/2019   Abdominal pain 09/16/2019   Fibroids 09/01/2019   Endometrial polyp 09/01/2019   Swelling of both ankles 08/24/2019   Abdominal bloating 08/24/2019   Pelvic pain 08/24/2019   Menorrhagia with regular cycle 08/24/2019   Overdose of antipsychotic with dilated pupils and possible dystonia affecting eye muscles 06/21/2012   Hyperglycemia 06/21/2012   Hyponatremia 06/21/2012   QT prolongation with abnormal EKG 06/21/2012   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 Authorizing Provider  benzonatate (TESSALON) 100 MG capsule Take 1 capsule (100 mg total) by mouth every 8 (eight) hours. 07/16/22  Yes Volney American, PA-C  guaiFENesin (MUCINEX) 600 MG 12 hr tablet Take 1 tablet (600 mg total) by mouth 2 (two) times daily as needed. 07/16/22  Yes Volney American, PA-C  aspirin 81 MG chewable tablet Chew by mouth daily.    [provider]  benzonatate (TESSALON) 100 MG capsule Take 1 capsule by mouth every 8 (eight) hours for cough. 05/21/22   Vanessa Kick, MD  cholecalciferol (VITAMIN D3) 25 MCG (1000 UNIT) tablet Take 1,000 Units by mouth  daily.    [provider]  diltiazem (CARDIZEM CD) 120 MG 24 hr capsule Take 120 mg by mouth daily. 11/11/21   [provider]  Ferrous Sulfate (IRON PO) Take by mouth daily.    [provider]  meloxicam (MOBIC) 7.5 MG tablet Take 7.5 mg by mouth daily. 01/28/22   [provider]  metoprolol succinate (TOPROL-XL) 50 MG 24 hr tablet Take 50 mg by mouth daily. 01/28/22   [provider]  omeprazole (PRILOSEC) 20 MG capsule Take 1 capsule (20 mg total) by mouth daily. 02/03/22   Sherron Monday, NP  predniSONE (DELTASONE) 20 MG tablet Take 2 tablets (40 mg total) by mouth daily. 05/21/22   Vanessa Kick, MD    Family History Family History  Problem Relation Age of Onset   Hypertension Mother    Diabetes Mother    Cancer Father    Colon cancer Brother    Heart failure Other     Social History Social  History   Tobacco Use   Smoking status: Never   Smokeless tobacco: Never  Vaping Use   Vaping Use: Never used  Substance Use Topics   Alcohol use: No   Drug use: No     Allergies   Latex   Review of Systems Review of Systems Per HPI  Physical Exam Triage Vital Signs ED Triage Vitals  Enc Vitals Group     BP 07/16/22 1837 108/73     Pulse Rate 07/16/22 1837 (!) 106     Resp 07/16/22 1837 18     Temp 07/16/22 1837 98 F (36.7 C)     Temp Source 07/16/22 1837 Oral     SpO2 07/16/22 1837 96 %     Weight --      Height --      Head Circumference --      Peak Flow --      Pain Score 07/16/22 1835 0     Pain Loc --      Pain Edu? --      Excl. in [place]? --    No data found.  Updated Vital Signs BP 108/73 (BP Location: Right Arm)   Pulse (!) 106   Temp 98 F (36.7 C) (Oral)   Resp 18   LMP 07/10/2022 (Exact Date)   SpO2 96%   Visual Acuity Right Eye Distance:   Left Eye Distance:   Bilateral Distance:    Right Eye Near:   Left Eye Near:    Bilateral Near:     Physical Exam Vitals and nursing note reviewed.  Constitutional:      Appearance: Normal appearance. She is not ill-appearing.  HENT:     Head: Atraumatic.     Right Ear: Tympanic membrane and external ear normal.     Left Ear: Tympanic membrane and external ear normal.     Nose: Rhinorrhea present.     Mouth/Throat:     Mouth: Mucous membranes are moist.     Pharynx: Posterior oropharyngeal erythema present.  Eyes:     Extraocular Movements: Extraocular movements intact.     Conjunctiva/sclera: Conjunctivae normal.  Cardiovascular:     Rate and Rhythm: Normal rate and regular rhythm.     Heart sounds: Normal heart sounds.  Pulmonary:     Effort: Pulmonary effort is normal.     Breath sounds: Normal breath sounds. No wheezing or rales.  Abdominal:     General: Bowel sounds are normal. There is no  distension.     Palpations: Abdomen is soft.     Tenderness: There is no abdominal  tenderness. There is no right CVA tenderness, left CVA tenderness or guarding.  Musculoskeletal:        General: Normal range of motion.     Cervical back: Normal range of motion and neck supple.  Skin:    General: Skin is warm and dry.  Neurological:     Mental Status: She is alert and oriented to person, place, and time.  Psychiatric:        Mood and Affect: Mood normal.        Thought Content: Thought content normal.        Judgment: Judgment normal.      UC Treatments / Results  Labs (all labs ordered are listed, but only abnormal results are displayed) Labs Reviewed  POCT URINALYSIS DIP (MANUAL ENTRY) - Abnormal; Notable for the following components:      Result Value   Clarity, UA hazy (*)    Spec Grav, UA >=1.030 (*)    All other components within normal limits  RESP PANEL BY RT-PCR (FLU A&B, COVID) [id number]    EKG   Radiology No results found.  Procedures Procedures (including critical care time)  Medications Ordered in UC Medications - No data to display  Initial Impression / Assessment and Plan / UC Course  I have reviewed the triage vital signs and the nursing notes.  Pertinent labs & imaging results that were available during my care of the patient were reviewed by me and considered in my medical decision making (see chart for details).     Minimally tachycardic in triage, otherwise vital signs benign and reassuring.  Suspect viral upper respiratory infection, respiratory panel pending, treat with Tessalon, Mucinex, other supportive medications and home care.  We will consider antiviral therapy if positive for COVID or flu.  Regarding her urinary symptoms, reassurance given as no evidence of urinary tract infection today.  Discussed good hydration and continue to monitor.  Final Clinical Impressions(s) / UC Diagnoses   Final diagnoses:  Viral URI with cough  Abnormal urine odor   Discharge Instructions   None    ED Prescriptions     Medication  Sig Dispense Auth. Provider   benzonatate (TESSALON) 100 MG capsule Take 1 capsule (100 mg total) by mouth every 8 (eight) hours. 21 capsule Volney American, [place]   guaiFENesin (MUCINEX) 600 MG 12 hr tablet Take 1 tablet (600 mg total) by mouth 2 (two) times daily as needed. 20 tablet Volney American, [place]      PDMP not reviewed this encounter.   Volney American, [place] 07/16/22 1947

## 2022-07-17 ENCOUNTER — Telehealth (HOSPITAL_COMMUNITY): Payer: Self-pay | Admitting: Emergency Medicine

## 2022-07-17 MED ORDER — NIRMATRELVIR/RITONAVIR (PAXLOVID)TABLET: 3.0000 | ORAL_TABLET | Freq: Two times a day (BID) | ORAL | 0 refills | Status: AC

## 2022-09-26 ENCOUNTER — Encounter: Payer: Self-pay | Admitting: Obstetrics & Gynecology

## 2022-09-26 ENCOUNTER — Ambulatory Visit (INDEPENDENT_AMBULATORY_CARE_PROVIDER_SITE_OTHER): Payer: Medicaid Other | Admitting: Obstetrics & Gynecology

## 2022-09-26 VITALS — BP 131/79 | HR 95 | Ht 64.0 in | Wt 195.0 lb

## 2022-09-26 DIAGNOSIS — N939 Abnormal uterine and vaginal bleeding, unspecified: Secondary | ICD-10-CM | POA: Diagnosis not present

## 2022-09-26 DIAGNOSIS — N951 Menopausal and female climacteric states: Secondary | ICD-10-CM

## 2022-09-26 MED ORDER — MEGESTROL ACETATE 40 MG PO TABS: ORAL_TABLET | ORAL | 0 refills | Status: DC

## 2022-09-26 NOTE — Progress Notes (Signed)
Chief Complaint  Patient presents with   having irregular periods    Bruising on stomach      51 y.o. [id number] Patient's last menstrual period was 09/19/2022. The current method of family planning is abstinence.  Outpatient Encounter Medications as of 09/26/2022  Medication Sig   aspirin 81 MG chewable tablet Chew by mouth daily.   diltiazem (CARDIZEM CD) 120 MG 24 hr capsule Take 120 mg by mouth daily.   Ferrous Sulfate (IRON PO) Take by mouth.   megestrol (MEGACE) 40 MG tablet 3 tablets a day for 5 days, 2 tablets a day for 5 days then 1 tablet daily   [DISCONTINUED] benzonatate (TESSALON) 100 MG capsule Take 1 capsule by mouth every 8 (eight) hours for cough.   [DISCONTINUED] benzonatate (TESSALON) 100 MG capsule Take 1 capsule (100 mg total) by mouth every 8 (eight) hours.   [DISCONTINUED] cholecalciferol (VITAMIN D3) 25 MCG (1000 UNIT) tablet Take 1,000 Units by mouth daily.   [DISCONTINUED] guaiFENesin (MUCINEX) 600 MG 12 hr tablet Take 1 tablet (600 mg total) by mouth 2 (two) times daily as needed.   [DISCONTINUED] meloxicam (MOBIC) 7.5 MG tablet Take 7.5 mg by mouth daily.   [DISCONTINUED] metoprolol succinate (TOPROL-XL) 50 MG 24 hr tablet Take 50 mg by mouth daily.   [DISCONTINUED] omeprazole (PRILOSEC) 20 MG capsule Take 1 capsule (20 mg total) by mouth daily.   [DISCONTINUED] predniSONE (DELTASONE) 20 MG tablet Take 2 tablets (40 mg total) by mouth daily.   No facility-administered encounter medications on file as of 09/26/2022.    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)    Endometrial polyp 09/01/2019   Pt aware, make appt  with Dr Glo Herring,    Fibroids 09/01/2019   Hypertension    Liver disease    Ovarian cyst    cervical polyp   Palpitations    possible bradycardia   Somatization disorder     Past Surgical History:  Procedure Laterality Date   None  05-22-12   hx of ovarian cysts    OB History     Gravida  3   Para  3   Term  3   Preterm      AB      Living  3      SAB      IAB      Ectopic      Multiple      Live Births              Allergies  Allergen Reactions   Latex Itching and Rash    Social History   Socioeconomic History   Marital status: Divorced    Spouse name: Not on file   Number of children: Not on file   Years of education: Not on file   Highest education level: Not on file  Occupational History   Occupation: Unemployed  Tobacco Use   Smoking status: Never   Smokeless tobacco: Never  Vaping Use   Vaping Use: Never used  Substance and Sexual Activity   Alcohol use: No   Drug use: No   Sexual activity: Not  Currently    Birth control/protection: None  Other Topics Concern   Not on file  Social History Narrative   Divorced.  Lives with her mother and 3 children.     Social Determinants of Health   Financial Resource Strain: Low Risk  (12/25/2020)   Overall Financial Resource Strain (CARDIA)    Difficulty of Paying Living Expenses: Not very hard  Food Insecurity: Food Insecurity Present (12/25/2020)   Hunger Vital Sign    Worried About Running Out of Food in the Last Year: Sometimes true    Ran Out of Food in the Last Year: Sometimes true  Transportation Needs: No Transportation Needs (12/25/2020)   PRAPARE - Hydrologist (Medical): No    Lack of Transportation (Non-Medical): No  Physical Activity: Sufficiently Active (12/25/2020)   Exercise Vital Sign    Days of Exercise per Week: 7 days    Minutes of Exercise per Session: 30 min  Stress: Stress Concern Present (12/25/2020)   [place]    Feeling of Stress : To some extent  Social Connections: Socially Isolated (12/25/2020)   Social Connection and Isolation Panel [NHANES]    Frequency of Communication with Friends and Family: More than three times a week    Frequency of Social Gatherings with Friends and Family: Twice a week    Attends Religious Services: Never    Marine scientist or Organizations: No    Attends Music therapist: Never    Marital Status: Divorced    Family History  Problem Relation Age of Onset   Hypertension Mother    Diabetes Mother    Cancer Father    Colon cancer Brother    Heart failure Other     Medications:       Current Outpatient Medications:    aspirin 81 MG chewable tablet, Chew by mouth daily., Disp: , Rfl:    diltiazem (CARDIZEM CD) 120 MG 24 hr capsule, Take 120 mg by mouth daily., Disp: , Rfl:    Ferrous Sulfate (IRON PO), Take by mouth., Disp: , Rfl:    megestrol (MEGACE) 40 MG tablet, 3 tablets a day for 5 days, 2 tablets a day for 5 days then 1 tablet daily, Disp: 45 tablet, Rfl: 0  Objective Blood pressure 131/79, pulse 95, height '5\' 4"'$  (1.626 m), weight 195 lb (88.5 kg), last menstrual period 09/19/2022.  General WDWN female NAD Vulva:  normal appearing vulva with no masses, tenderness or lesions Vagina:  normal mucosa, no discharge Cervix:  Normal no lesions no polyps noted Uterus:  normal size, contour, position, consistency, mobility, non-tender Adnexa: ovaries:present,  normal adnexa in size, nontender and no masses    Pertinent ROS ,lherosm   Labs or studies Reviewed, recent Ca 125 is normal    Impression + Management Plan: Diagnoses this Encounter::   ICD-10-CM   1. Abnormal uterine bleeding (AUB)  N93.9     2. Perimenopausal  N95.1         Medications prescribed during  this encounter: Meds ordered this encounter  Medications   megestrol (MEGACE) 40 MG tablet    Sig: 3  tablets a day for 5 days, 2 tablets a day for 5 days then 1 tablet daily    Dispense:  45 tablet    Refill:  0    Labs or Scans Ordered during this encounter: No orders of the defined types were placed in this  encounter.     Follow up Return in about 8 weeks (around 11/21/2022) for Follow up, with Dr Elonda Husky.

## 2022-09-30 ENCOUNTER — Telehealth: Payer: Self-pay

## 2022-09-30 NOTE — Telephone Encounter (Signed)
Patient called and stated that she was in last week and wants to know her results

## 2022-09-30 NOTE — Telephone Encounter (Signed)
Called patient and phone just rings. Called alternate number and it was her son.

## 2022-11-20 ENCOUNTER — Telehealth: Payer: Self-pay | Admitting: Gastroenterology

## 2022-11-20 ENCOUNTER — Encounter: Payer: Self-pay | Admitting: Obstetrics & Gynecology

## 2022-11-20 ENCOUNTER — Ambulatory Visit (INDEPENDENT_AMBULATORY_CARE_PROVIDER_SITE_OTHER): Payer: Medicaid Other | Admitting: Adult Health

## 2022-11-20 ENCOUNTER — Ambulatory Visit: Payer: Medicaid Other | Admitting: Obstetrics & Gynecology

## 2022-11-20 VITALS — BP 138/82 | HR 84 | Ht 64.0 in | Wt 192.0 lb

## 2022-11-20 DIAGNOSIS — D239 Other benign neoplasm of skin, unspecified: Secondary | ICD-10-CM

## 2022-11-20 DIAGNOSIS — D229 Melanocytic nevi, unspecified: Secondary | ICD-10-CM | POA: Insufficient documentation

## 2022-11-20 DIAGNOSIS — Z1231 Encounter for screening mammogram for malignant neoplasm of breast: Secondary | ICD-10-CM | POA: Insufficient documentation

## 2022-11-20 NOTE — Telephone Encounter (Signed)
Patient presented to the office today, upset regarding concerns about recent imaging.  She provided photos screenshots of recent imaging impressions from MRI and ultrasound that she had performed on 10/30/2022 and 11/17/2022.  Also with screenshots of recent lab work from November 2023.  It appears imaging was ordered by her primary care.  Hard to determine which impressions recommend images based off of pictures provided therefore I reviewed care everywhere and have found reports from MRI and ultrasound.  See listed impressions and findings below as well as lab work.  CA125 normal at 15.  Cologuard negative.  Labs in April 2023 with normal immunoglobulins, AFP.  ANA, ASMA, and AMA negative.  Also workup negative for viral hepatitis.  Iron panel 09/08/2022: Iron 30 TIBC 364, Iron saturation 8%  Labs 09/29/2022: Hemoglobin 10.6, MCV 85, platelets 304, normal renal and liver function  Complete abdominal ultrasound 10/30/2022: -Gallbladder with wall echo shadow sign with extensive shadowing suggesting multiple gallstones -CBD measuring 0.3 cm -Hyperechoic lesion Rehman lobe measuring 2.4 x 1.7 x 2.1 cm.  Normal parenchymal echogenicity.  Patent Doppler (recommended liver MRI for definitive characterization) -Visualized portion of pancreas unremarkable -Small benign left renal cyst   MRI abdomen without contrast 11/17/22: -Right hepatic lobe lesion measuring 2.7 x 2.2 x 2.6 cm prior to markedly T2 hyperintense.  No central prior iron in the liver.  Spleen and liver contour -Mention prior CT imaging with slight increase in lesion from 2.3 to 2.7 cm now but with features of benign hemangioma. 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  Lesions in the liver and pancreas appear to be benign and are small in size and stable as of right now.  Follow-up in 1 year with MRI/MRCP is recommended.  She does have evidence of iron deficiency.  If not on oral iron therapy she should begin a daily oral iron supplement.  As recommended at her office visit in April 2023 given her anemia and her family history of colon cancer,  colonoscopy is recommended.  Now with anemia she should also undergo EGD for further evaluation.  Had previous complaints of dysphagia at her office visit therefore she would benefit from EGD with possible dilation regarding this as well.  Venetia Night, MSN, APRN, FNP-BC, AGACNP-BC [facility]

## 2022-11-20 NOTE — Progress Notes (Signed)
  Subjective:     Patient ID: Kayla [name], female   DOB: [birth year], 52 y.o.   MRN: [id number]  HPI Kayla [name] is a 52 year old white female, divorced, [id number], in requesting breast exam and has black spot near labia. She did not take megace that Dr Elonda Husky gave her. She says she has lesion on liver, pancreatis and kidney, seeing GI soon.     Component Value Date/Time   DIAGPAP  12/25/2020 1113    - Negative for Intraepithelial Lesions or Malignancy (NILM)   DIAGPAP - Benign reactive/reparative changes 12/25/2020 1113   DIAGPAP (A) 10/27/2019 1002    - Atypical squamous cells of undetermined significance (ASC-US)   HPVHIGH Negative 12/25/2020 1113   HPVHIGH Negative 10/27/2019 1002   ADEQPAP  12/25/2020 1113    Satisfactory for evaluation; transformation zone component PRESENT.   ADEQPAP  10/27/2019 1002    Satisfactory for evaluation; transformation zone component PRESENT.   PCP is Denyce Robert  Review of Systems Black near labia Reviewed past medical,surgical, social and family history. Reviewed medications and allergies.     Objective:   Physical Exam BP 138/82 (BP Location: Left Arm, Patient Position: Sitting, Cuff Size: Normal)   Pulse 84   Ht '5\' 4"'$  (1.626 m)   Wt 192 lb (87.1 kg)   BMI 32.96 kg/m      Skin warm and dry,  Breasts:no dominate palpable mass, retraction or nipple discharge. Pelvic: external genitalia is normal in appearance, has flat light brown macule right groin and one on left also, and has several tiny angiokeratomas on right. Examination chaperoned by Levy Pupa LPN   Upstream - 81/82/99 1118       Pregnancy Intention Screening   Does the patient want to become pregnant in the next year? No    Does the patient's partner want to become pregnant in the next year? No    Would the patient like to discuss contraceptive options today? No      Contraception Wrap Up   Current Method Abstinence    End Method Abstinence    Contraception Counseling  Provided No             Assessment:     1. Screening mammogram for breast cancer Will get screening mammogram at [facility] in [place]  2. Angiokeratoma Showed her pictures in [facility]  3. Benign skin mole     Plan:     Return in 2 months for pap and physical, extra time

## 2022-11-20 NOTE — Telephone Encounter (Signed)
Spoke to pt, informed her of findings and recommendations. Pt states she understood something's but still had questions. I informed her that provider would go over all of this at her [place]. Reminded her of OV on Feb 20.

## 2022-11-21 ENCOUNTER — Ambulatory Visit: Payer: Medicaid Other | Admitting: Obstetrics & Gynecology

## 2022-12-02 ENCOUNTER — Telehealth: Payer: Self-pay | Admitting: Gastroenterology

## 2022-12-02 ENCOUNTER — Encounter: Payer: Self-pay | Admitting: Gastroenterology

## 2022-12-02 ENCOUNTER — Ambulatory Visit (INDEPENDENT_AMBULATORY_CARE_PROVIDER_SITE_OTHER): Payer: Medicaid Other | Admitting: Gastroenterology

## 2022-12-02 VITALS — BP 127/84 | HR 89 | Temp 98.2°F | Ht 64.0 in | Wt 188.6 lb

## 2022-12-02 DIAGNOSIS — K769 Liver disease, unspecified: Secondary | ICD-10-CM | POA: Diagnosis not present

## 2022-12-02 DIAGNOSIS — R634 Abnormal weight loss: Secondary | ICD-10-CM

## 2022-12-02 DIAGNOSIS — D509 Iron deficiency anemia, unspecified: Secondary | ICD-10-CM

## 2022-12-02 DIAGNOSIS — R197 Diarrhea, unspecified: Secondary | ICD-10-CM | POA: Diagnosis not present

## 2022-12-02 DIAGNOSIS — N281 Cyst of kidney, acquired: Secondary | ICD-10-CM

## 2022-12-02 DIAGNOSIS — D490 Neoplasm of unspecified behavior of digestive system: Secondary | ICD-10-CM | POA: Diagnosis not present

## 2022-12-02 NOTE — Patient Instructions (Addendum)
I am ordering labs for you to have completed. I will be in touch once results received.   Continue oral iron therapy daily.  As we discussed I do recommend EGD and colonoscopy in the near future.  Will place on recall and plan to have a repeat MRI/MRCP in 6 months.  I will refer you to genetic counseling regarding her family history of colon cancer and to discuss further testing for various cancers.  Regarding your right groin pain, please continue to discuss this with gynecology.  We will plan to follow-up in 2-3 months.  I will be in touch with results in the meantime.  Please not hesitate to reach out if you have any new or worsening issues.  For your diarrhea you may take Imodium as needed.  It was a pleasure to see you today. I want to create trusting relationships with patients. If you receive a survey regarding your visit,  I greatly appreciate you taking time to fill this out on paper or through your MyChart. I value your feedback.  Venetia Night, MSN, FNP-BC, AGACNP-BC [facility]

## 2022-12-02 NOTE — Telephone Encounter (Signed)
Patient left 2 very detailed messages yesterday about her appt today.  There are a couple of tests she wants done she said.  One that tests for 50 types of cancer, Gallari test, kidney function test and LFT test.  The message asked that we let you know this information before she came in but they were on the messages this morning.  Just passing the information along.

## 2022-12-02 NOTE — Progress Notes (Unsigned)
GI Office Note    Referring Provider: Denyce Robert, [place] Primary Care Physician:  Denyce Robert, [place] Primary Gastroenterologist: Elon Alas. Abbey Chatters, DO  Date:  12/03/2022  ID:  Kayla [name], DOB 1970-10-21, MRN [id number]   Chief Complaint   Chief Complaint  Patient presents with   Follow-up    Patient here today for a follow up appointment. Patient says she has a bm after every meal. She says she has stools that are loose and are greasy. Patient says she brought records today and wants to discuss her cysts on kidneys and some liver neoplasms.    History of Present Illness  Kayla [name] is a 52 y.o. female with a history of GERD, anxiety, bipolar, HTN, and cervical polyps, endometrial polyps, hepatic steatosis, presenting today with complaint of diarrhea, abdominal pain, concern for cancer and discuss recent imaging findings.   US Abdomen 09/21/20 - shadowing gallstones, no cholecystitis, slightly echogenic liver consistent with hepatic steatosis, normal liver blood flow on doppler, spleen normal   Seen in the ED 01/11/22 with sinusitis and complaint of body aches, diarrhea, congestion, burning in her chest, cough and joint aches. She had reported 2 tick bites this year. She was started on doxycycline to treat for sinusitis and possible tick borne illness.   Last office visit 02/03/22. Reported diarrhea for about a year.  Having about 2 bowel movements per day.  [place] 4-6 and varies.  No nocturnal diarrhea.  No bleeding with bowel movements.  Pain described as colicky and sharp.  Reported phobia of going to sleep and does not like [facility].  Reported brother had recently been diagnosed with colon cancer.  Reported a 30 pound weight loss over 1 year.  Also noted to have frequent cough and choking on food, dysphagia, needing to clear her throat frequently.  Admitted to NSAID use, obesity, and caffeine use.  Denied any alcohol use or chronic steroids or aspirin powder.   Denied any rectal bleeding.  Colonoscopy advised however patient declined.  Cologuard ordered.  Labs including CBC, CMP, TSH, hepatitis panel, ANA, ASMA, AMA, immunoglobulins, and AFP ordered.  Ultrasound records requested.  Advised omeprazole 20 mg daily.  Right upper quadrant ultrasound ordered.  Discussed fatty liver diet as well as GERD diet.  Advised to follow-up in 3 months.  Colonoscopy declined by patient.  Labs in April 2023 with negative hepatitis panel, normal AFP, immunoglobulins, AMA, ASMA, and ANA all normal.  TSH also normal.  CA125 normal at 15 in November 2023. .   Cologuard negative May 2023.   Labs in April 2023 with normal immunoglobulins, AFP.  ANA, ASMA, and AMA negative.  Also workup negative for viral hepatitis.   Iron panel 09/08/2022: Iron 30 TIBC 364 Iron saturation 8%   Labs 09/29/2022: Hemoglobin 10.6, MCV 85, platelets 304, normal renal and liver function   Complete abdominal ultrasound 10/30/2022: -Gallbladder with wall echo shadow sign with extensive shadowing suggesting multiple gallstones -CBD measuring 0.3 cm -Hyperechoic lesion Rehman lobe measuring 2.4 x 1.7 x 2.1 cm.  Normal parenchymal echogenicity.  Patent Doppler (recommended liver MRI for definitive characterization) -Visualized portion of pancreas unremarkable -Small benign left renal cyst  MRI abdomen without contrast 11/17/22: -Right hepatic lobe lesion measuring 2.7 x 2.2 x 2.6 cm prior to markedly T2 hyperintense.  No central prior iron in the liver.  Spleen and liver contour -Mention prior CT imaging with slight increase in lesion from 2.3 to 2.7 cm now but with features of  benign hemangioma. 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 Has constant fatigue but remains very busy. Denies shortness of breath, chest pain, lower extremity edema. Denies PICA.   Went to PCP in December, January about abdominal pain and squeezing pressure to her abdomen.   Reports she has lost about 8lbs in the last couple months. Reports she is concerned about her kidneys based on the images and her difficulty with urination and has been urinating more at night. Concerned about her kidneys. Also recently went to dermatologist and took off a mole form her vagina and is awaiting biopsy results from this. Concerned about cancer given her recent imaging findings.   Having diarrhea and urgency after meals. Reports a bruise to her abdomen on the left side that occurred and that's what triggered her to go to her PCP and then had ultrasounds ordered. Currently not there. Denied injury.   Is concerned about her cysts/neoplasms on her pancreas. States when she drinks almond milk she has had some white in her stools. Has fear of going to sleep for procedures. Concerned given history of colon cancer in her brother and other various cancers within her family.   Has had some RLQ pain and back pain.   Swelling to her legs.   Wt Readings from Last 3 Encounters:  12/02/22 188 lb 9.6 oz (85.5 kg)  11/20/22 192 lb (87.1 kg)  09/26/22 195 lb (88.5 kg)  02/03/22 200 lb   Current Outpatient Medications  Medication Sig Dispense Refill   diltiazem (CARDIZEM CD) 120 MG 24 hr capsule Take 120 mg by mouth daily.     Ferrous Sulfate (IRON PO) Take by mouth 3 (three) times a week.     No current facility-administered medications for this visit.    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)    Endometrial polyp 09/01/2019   Pt aware, make appt with Dr Glo Herring,    Fibroids 09/01/2019   Hypertension     Liver disease    Ovarian cyst    cervical polyp   Palpitations    possible bradycardia   Somatization disorder     Past Surgical History:  Procedure Laterality Date   None  05-22-12   hx of ovarian cysts    Family History  Problem Relation Age of Onset   Hypertension Mother    Diabetes Mother    Cancer Father    Colon cancer Brother    Heart failure Other     Allergies as of 12/02/2022 - Review Complete 12/02/2022  Allergen Reaction Noted   Latex Itching and Rash 05/19/2012    Social History   Socioeconomic History   Marital status: Divorced    Spouse name: Not on file   Number of children: Not on file   Years of education: Not on file   Highest education level: Not on file  Occupational History   Occupation: Unemployed  Tobacco Use   Smoking status: Never   Smokeless tobacco: Never  Vaping Use   Vaping Use: Never used  Substance and Sexual Activity   Alcohol use: No   Drug use:  No   Sexual activity: Not Currently    Birth control/protection: None  Other Topics Concern   Not on file  Social History Narrative   Divorced.  Lives with her mother and 3 children.     Social Determinants of Health   Financial Resource Strain: Low Risk  (12/25/2020)   Overall Financial Resource Strain (CARDIA)    Difficulty of Paying Living Expenses: Not very hard  Food Insecurity: Food Insecurity Present (12/25/2020)   Hunger Vital Sign    Worried About Running Out of Food in the Last Year: Sometimes true    Ran Out of Food in the Last Year: Sometimes true  Transportation Needs: No Transportation Needs (12/25/2020)   PRAPARE - Hydrologist (Medical): No    Lack of Transportation (Non-Medical): No  Physical Activity: Sufficiently Active (12/25/2020)   Exercise Vital Sign    Days of Exercise per Week: 7 days    Minutes of Exercise per Session: 30 min  Stress: Stress Concern Present (12/25/2020)   [place]    Feeling of Stress : To some extent  Social Connections: Socially Isolated (12/25/2020)   Social Connection and Isolation Panel [NHANES]    Frequency of Communication with Friends and Family: More than three times a week    Frequency of Social Gatherings with Friends and Family: Twice a week    Attends Religious Services: Never    Marine scientist or Organizations: No    Attends Music therapist: Never    Marital Status: Divorced     Review of Systems   Gen: Denies fever, chills, anorexia. Denies fatigue, weakness, weight loss.  CV: Denies chest pain, palpitations, syncope, peripheral edema, and claudication. Resp: Denies dyspnea at rest, cough, wheezing, coughing up blood, and pleurisy. GI: See HPI Derm: Denies rash, itching, dry skin Psych: Denies depression, anxiety, memory loss, confusion. No homicidal or suicidal ideation.  Heme: Denies bruising, bleeding, and enlarged lymph nodes.   Physical Exam   BP 127/84 (BP Location: Left Arm, Patient Position: Sitting, Cuff Size: Large)   Pulse 89   Temp 98.2 F (36.8 C) (Temporal)   Ht 5' 4"$  (1.626 m)   Wt 188 lb 9.6 oz (85.5 kg)   BMI 32.37 kg/m   General:   Alert and oriented. No distress noted. Cooperative. Hyperactive. Head:  Normocephalic and atraumatic. Eyes:  Conjuctiva clear without scleral icterus. Mouth:  Oral mucosa pink and moist. Good dentition. No lesions. Lungs:  Clear to auscultation bilaterally. No wheezes, rales, or rhonchi. No distress.  Heart:  S1, S2 present without murmurs appreciated.  Abdomen:  +BS, soft,  non-distended.  Mild TTP to LUQ and RUQ.  No rebound or guarding.  Soft reducible ventral hernia present. Rectal: deferred Msk:  Symmetrical without gross deformities. Normal posture. Extremities:  Without edema. Neurologic:  Alert and  oriented x4 Psych:  Labile mood, intermittently tearful. Alert and cooperative.   Assessment  DEDRE SHORTS is a 52 y.o. female with a history of GERD, anxiety, bipolar, HTN, and cervical polyps, endometrial polyps, hepatic steatosis, presenting today with complaint of diarrhea, abdominal pain, concern for cancer and discuss recent imaging findings.   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 Recommended f/u imaging in 1 year. Patient would like sooner evaluation. Discussed extensively regarding monitoring and further evaluation. Patient has concerns in waiting one year so will recall  for 6 months. Given diarrhea will check fecal elastase. Patient also believes she has had a family history of pancreatic issues therefore we will check CEA and CA 19-9. Discussed depending on imaging results will determine need for EUS with fine needle aspiration vs surgical resection.   Weight loss: Has experienced weight loss over the last year. Weight 200 lbs in April at last visit and currently 188 lbs. Has denied vomiting. CA 125 normal. Does have iron deficiency as stated above. History of hepatic steatosis with negative work up. Has had cervical and endometrial polyps and awaiting biopsy currently with GYN. As stated, colonoscopy has been recommended but patient has declined multiple times. Could be possible psychosocial component given her bipolar and stress at home caring for her mother. Given family history of cancer will refer to genetic counseling per her request and given IPMN,   Diarrhea: Occurring after meals.   Family history of colon cancer:   Liver lesions: Hemangiomas on MRI/MRCP.   PLAN   *** EGD and colonoscopy offered, patient would like to be done after her blood work is performed and other labs performed to rule out other causes.   CA 19-9, CEA, fecal elastase, iron panel, CBC, CMP MRI/MRCP in 6 months.  May trial cholestyramine vs colestipol in the future if diarrhea continues and labs/stool studies negative.  Genetic counseling  referral.  Urology referral per patient request Continue oral iron therapy Continue to follow with GYN [place] as needed.  Follow up in 2-3 months.    Venetia Night, MSN, FNP-BC, AGACNP-BC [facility]

## 2022-12-03 ENCOUNTER — Telehealth: Payer: Self-pay | Admitting: *Deleted

## 2022-12-03 LAB — COMPREHENSIVE METABOLIC PANEL
AG Ratio: 1.4 (calc) (ref 1.0–2.5)
ALT: 11 U/L (ref 6–29)
AST: 15 U/L (ref 10–35)
Albumin: 4.3 g/dL (ref 3.6–5.1)
Alkaline phosphatase (APISO): 78 U/L (ref 37–153)
BUN: 16 mg/dL (ref 7–25)
CO2: 26 mmol/L (ref 20–32)
Calcium: 9.3 mg/dL (ref 8.6–10.4)
Chloride: 105 mmol/L (ref 98–110)
Creat: 0.6 mg/dL (ref 0.50–1.03)
Globulin: 3.1 g/dL (calc) (ref 1.9–3.7)
Glucose, Bld: 91 mg/dL (ref 65–99)
Potassium: 4.5 mmol/L (ref 3.5–5.3)
Sodium: 141 mmol/L (ref 135–146)
Total Bilirubin: 0.4 mg/dL (ref 0.2–1.2)
Total Protein: 7.4 g/dL (ref 6.1–8.1)

## 2022-12-03 LAB — CBC
HCT: 34.9 % — ABNORMAL LOW (ref 35.0–45.0)
Hemoglobin: 11 g/dL — ABNORMAL LOW (ref 11.7–15.5)
MCH: 25.5 pg — ABNORMAL LOW (ref 27.0–33.0)
MCHC: 31.5 g/dL — ABNORMAL LOW (ref 32.0–36.0)
MCV: 81 fL (ref 80.0–100.0)
MPV: 13.3 fL — ABNORMAL HIGH (ref 7.5–12.5)
Platelets: 318 10*3/uL (ref 140–400)
RBC: 4.31 10*6/uL (ref 3.80–5.10)
RDW: 14.4 % (ref 11.0–15.0)
WBC: 7.7 10*3/uL (ref 3.8–10.8)

## 2022-12-03 LAB — IRON,TIBC AND FERRITIN PANEL
%SAT: 7 % (calc) — ABNORMAL LOW (ref 16–45)
Ferritin: 3 ng/mL — ABNORMAL LOW (ref 16–232)
Iron: 30 ug/dL — ABNORMAL LOW (ref 45–160)
TIBC: 435 mcg/dL (calc) (ref 250–450)

## 2022-12-03 LAB — CEA: CEA: <2 ng/mL

## 2022-12-03 LAB — CANCER ANTIGEN 19-9: CA 19-9: 39 U/mL — ABNORMAL HIGH (ref <34)

## 2022-12-03 NOTE — Telephone Encounter (Signed)
Benjie Karvonen from urology called and left vm stating that the dx code D49.0 is not treated by urology but is treated by  endocrinology. She wants to know if pt is having any other issues for her to be seen by urology. Please advise. Thank you.  # [phone], Benjie Karvonen at urology's direct number

## 2022-12-03 NOTE — Telephone Encounter (Signed)
Kayla [name] at [facility] was made aware of dx for pt. She said she would change them and call the pt.

## 2022-12-04 ENCOUNTER — Telehealth: Payer: Self-pay | Admitting: Adult Health

## 2022-12-04 NOTE — Telephone Encounter (Signed)
Pt states she got a second opinion about the lump on her vagina and would like a call back.

## 2022-12-04 NOTE — Telephone Encounter (Signed)
Pt saw Kayla [name] regarding the lump on her vagina. Amber removed it. It came back angioma. She is to return in 6 months to see [employer]. Batsheva wanted you to know. [place]

## 2022-12-05 ENCOUNTER — Telehealth: Payer: Self-pay | Admitting: *Deleted

## 2022-12-05 ENCOUNTER — Ambulatory Visit: Payer: Medicaid Other

## 2022-12-05 NOTE — Telephone Encounter (Signed)
Spoke to pt, she informed me that it was an old message that she left me and she was fine, just worried about her stool test. I informed her that I would let her know as soon as the results were back.

## 2022-12-10 LAB — PANCREATIC ELASTASE, FECAL: Pancreatic Elastase-1, Stool: >500 mcg/g

## 2022-12-11 ENCOUNTER — Encounter: Payer: Self-pay | Admitting: Radiology

## 2022-12-15 ENCOUNTER — Telehealth: Payer: Self-pay | Admitting: *Deleted

## 2022-12-15 NOTE — Telephone Encounter (Signed)
Pt called and states she needs the number for the kidney doctor that she was referred to. She states she needs to get this started, because she is having problems and she does not want this to turn into cancer.

## 2022-12-15 NOTE — Telephone Encounter (Signed)
Noted  

## 2022-12-15 NOTE — Telephone Encounter (Signed)
Pt given number to urology office.

## 2022-12-17 ENCOUNTER — Encounter: Payer: Self-pay | Admitting: Obstetrics & Gynecology

## 2022-12-23 ENCOUNTER — Other Ambulatory Visit: Payer: Self-pay

## 2022-12-23 ENCOUNTER — Inpatient Hospital Stay: Payer: Medicaid Other

## 2022-12-23 ENCOUNTER — Inpatient Hospital Stay: Payer: Medicaid Other | Attending: Nurse Practitioner | Admitting: Genetic Counselor

## 2022-12-23 DIAGNOSIS — Z800 Family history of malignant neoplasm of digestive organs: Secondary | ICD-10-CM

## 2022-12-23 LAB — GENETIC SCREENING ORDER

## 2022-12-23 NOTE — Progress Notes (Unsigned)
REFERRING PROVIDER: Sherron Monday, NP [address] [place],  [place] [address]  PRIMARY PROVIDER:  Denyce Robert, FNP  PRIMARY REASON FOR VISIT:  Encounter Diagnosis  Name Primary?   Family history of colon cancer Yes   HISTORY OF PRESENT ILLNESS:   Kayla [name], a 52 y.o. female, was seen for a  cancer genetics consultation at the request of Dr. Mickle Mallory due to a family history of cancer.  Kayla [name] presents to clinic today to discuss the possibility of a hereditary predisposition to cancer, to discuss genetic testing, and to further clarify her future cancer risks, as well as potential cancer risks for family members.   Kayla [name] is a 52 y.o. female with no personal history of cancer. 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  RISK FACTORS:  OCP use for a few months Ovaries intact: yes.  Uterus intact: yes.  HRT use: 0 years. Colonoscopy: no Mammogram within the last year: no. Number of breast biopsies: 0. Up to date with pelvic exams: yes. 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)    Endometrial polyp 09/01/2019   Pt aware, make appt with Dr Glo [name],    Fibroids 09/01/2019   Hypertension    Liver disease    Ovarian cyst    cervical polyp   Palpitations    possible bradycardia   Somatization disorder     Past Surgical History:  Procedure Laterality Date   None  05-22-12   hx of ovarian cysts    Social History   Socioeconomic History   Marital status: Divorced    Spouse name: Not on file   Number of children: Not on file   Years of education: Not on file   Highest education level: Not on file  Occupational History   Occupation: Unemployed  Tobacco Use   Smoking status: Never   Smokeless tobacco: Never  Vaping Use   Vaping Use: Never used  Substance and Sexual Activity    Alcohol use: No   Drug use: No   Sexual activity: Not Currently    Birth control/protection: None  Other Topics Concern   Not on file  Social History Narrative   Divorced.  Lives with her mother and 3 children.     Social Determinants of Health   Financial Resource Strain: Low Risk  (12/25/2020)   Overall Financial Resource Strain (CARDIA)    Difficulty of Paying Living Expenses: Not very hard  Food Insecurity: Food Insecurity Present (12/25/2020)   Hunger Vital Sign    Worried About Running Out of Food in the Last Year: Sometimes true    Ran Out of Food in the Last Year: Sometimes true  Transportation Needs: No Transportation Needs (12/25/2020)   PRAPARE - Hydrologist (Medical): No    Lack of Transportation (Non-Medical): No  Physical Activity: Sufficiently Active (12/25/2020)   Exercise Vital Sign    Days of Exercise per Week: 7 days    Minutes of Exercise per Session: 30 min  Stress: Stress Concern Present (12/25/2020)   [place]    Feeling of Stress : To some extent  Social Connections: Socially Isolated (12/25/2020)   Social Connection and Isolation Panel [NHANES]    Frequency of Communication with Friends and Family: More than three  times a week    Frequency of Social Gatherings with Friends and Family: Twice a week    Attends Religious Services: Never    Marine scientist or Organizations: No    Attends Music therapist: Never    Marital Status: Divorced     FAMILY HISTORY:  We obtained a detailed, 4-generation family history.  Significant diagnoses are listed below: Family History  Problem Relation Age of Onset   Hypertension Mother    Diabetes Mother    Lymphoma Father 14   Colon cancer Brother 42   Melanoma Maternal Aunt    Lung cancer Maternal Uncle    Bone cancer Paternal Aunt    Lung cancer Paternal Uncle    Brain cancer Maternal Grandfather  11 - 53   Heart failure Other        Kayla [name]'s brother was diagnosed with colon cancer at age 58. Her maternal aunt has a history of melanoma and her maternal uncle died due to metastatic lung cancer. One of her maternal cousins was diagnosed with kidney cancer at age 74 and died at age 62. A second maternal cousin died due to metastatic lung cancer at age 80. Her maternal grandfather was diagnosed with brain cancer in his 78s and died at age 25. Kayla [name] father was diagnosed with lymphoma at age 62, he died months later. A paternal uncle died due to metastatic lung cancer and a maternal aunt died due to metastatic bone cancer. Kayla [name] is unaware of previous family history of genetic testing for hereditary cancer risks. There is no reported Ashkenazi Jewish ancestry.   GENETIC COUNSELING ASSESSMENT: Kayla [name] is a 52 y.o. female with a family history of cancer which is somewhat suggestive of a hereditary predisposition to cancer. We, therefore, discussed and recommended the following at today's visit.   DISCUSSION: We discussed that 5 - 10% of cancer is hereditary, with most cases of hereditary colon cancer associated with Lynch Syndrome. There are other genes that can be associated with hereditary colon cancer syndromes.  We discussed that testing is beneficial for several reasons, including knowing about other cancer risks, identifying potential screening and risk-reduction options that may be appropriate, and to understanding if other family members could be at risk for cancer and allowing them to undergo genetic testing.  We reviewed the characteristics, features and inheritance patterns of hereditary cancer syndromes. We also discussed genetic testing, including the appropriate family members to test, the process of testing, insurance coverage and turn-around-time for results. We discussed the implications of a negative, positive, carrier and/or variant of uncertain significant result.  Negative results would be uninformative given that Kayla [name] does not have a personal history of cancer.   Kayla [name] elected to have [facility] CancerNext Panel. The CancerNext gene panel offered by [employer] includes sequencing, rearrangement analysis, and RNA analysis for the following 34 genes:   APC, ATM, AXIN2, BARD1, BMPR1A, BRCA1, BRCA2, BRIP1, CDH1, CDK4, CDKN2A, CHEK2, DICER1, HOXB13, EPCAM, GREM1, MLH1, MSH2, MSH3, MSH6, MUTYH, NF1, NTHL1, PALB2, PMS2, POLD1, POLE, PTEN, RAD51C, RAD51D, SMAD4, SMARCA4, STK11, and TP53.   Based on Kayla [name]'s family history of cancer, she does not meet medical criteria for genetic testing. However, she is still interested in proceeding with testing. A federal law called the Genetic Information Non-Discrimination Act (GINA) of 2008 helps protect individuals against genetic discrimination based on their genetic test results.  It impacts both health insurance and employment.  With health insurance, it protects  against increased premiums, being kicked off insurance or being forced to take a test in order to be insured.  For employment it protects against hiring, firing and promoting decisions based on genetic test results.  GINA does not apply to those in the [employer], those who work for companies with less than 15 employees, and new life insurance or long-term disability insurance policies.  Health status due to a cancer diagnosis is not protected under GINA.  PLAN: After considering the risks, benefits, and limitations, Kayla [name] provided informed consent to pursue genetic testing and the blood sample was sent to [facility] for analysis of the CancerNext Panel. Results should be available within approximately 2-3 weeks' time, at which point they will be disclosed by telephone to Kayla [name], as will any additional recommendations warranted by these results. Kayla [name] will receive a summary of her genetic counseling visit and a copy of her results once  available. This information will also be available in Epic.   Kayla [name] questions were answered to her satisfaction today. Our contact information was provided should additional questions or concerns arise. Thank you for the referral and allowing [place] to share in the care of your patient.   Lucille Passy, MS, [facility] Genetic Counselor [email or web address] (P) [phone]  The patient was seen for a total of 60 minutes in face-to-face genetic counseling. The patient was seen alone.  Drs. Lindi Adie and/or Burr Medico were available to discuss this case as needed.  _______________________________________________________________________ For Office Staff:  Number of people involved in session: 1 Was an Intern/ student involved with case: no

## 2022-12-24 ENCOUNTER — Encounter: Payer: Self-pay | Admitting: Genetic Counselor

## 2022-12-24 DIAGNOSIS — Z800 Family history of malignant neoplasm of digestive organs: Secondary | ICD-10-CM | POA: Insufficient documentation

## 2022-12-25 ENCOUNTER — Encounter: Payer: Self-pay | Admitting: Obstetrics & Gynecology

## 2022-12-26 ENCOUNTER — Encounter: Payer: Self-pay | Admitting: Genetic Counselor

## 2023-01-02 ENCOUNTER — Telehealth: Payer: Self-pay | Admitting: Genetic Counselor

## 2023-01-02 NOTE — Telephone Encounter (Signed)
Disclosed negative genetic testing.  Recommended that she continue cancer screening as recommended by her providers.

## 2023-01-06 ENCOUNTER — Ambulatory Visit: Payer: Self-pay | Admitting: Genetic Counselor

## 2023-01-06 ENCOUNTER — Encounter: Payer: Self-pay | Admitting: Genetic Counselor

## 2023-01-06 DIAGNOSIS — Z1379 Encounter for other screening for genetic and chromosomal anomalies: Secondary | ICD-10-CM | POA: Insufficient documentation

## 2023-01-06 DIAGNOSIS — Z800 Family history of malignant neoplasm of digestive organs: Secondary | ICD-10-CM

## 2023-01-06 NOTE — Progress Notes (Signed)
HPI:   Kayla [name] was previously seen in the [facility] due to a family history of cancer and concerns regarding a hereditary predisposition to cancer. Please refer to our prior cancer genetics clinic note for more information regarding our discussion, assessment and recommendations, at the time. Kayla [name] recent genetic test results were disclosed to her, as were recommendations warranted by these results. These results and recommendations are discussed in more detail below.  CANCER HISTORY:  Oncology History   No history exists.    FAMILY HISTORY:  We obtained a detailed, 4-generation family history.  Significant diagnoses are listed below:      Family History  Problem Relation Age of Onset   Hypertension Mother     Diabetes Mother     Lymphoma Father 75   Colon cancer Brother 75   Melanoma Maternal Aunt     Lung cancer Maternal Uncle     Bone cancer Paternal Aunt     Lung cancer Paternal Uncle     Brain cancer Maternal Grandfather 70 - 46   Heart failure Other             Kayla [name] brother was diagnosed with colon cancer at age 39. Her maternal aunt has a history of melanoma and her maternal uncle died due to metastatic lung cancer. One of her maternal cousins was diagnosed with kidney cancer at age 20 and died at age 50. A second maternal cousin died due to metastatic lung cancer at age 42. Her maternal grandfather was diagnosed with brain cancer in his 7s and died at age 21. Kayla [name] father was diagnosed with lymphoma at age 62, he died months later. A paternal uncle died due to metastatic lung cancer and a maternal aunt died due to metastatic bone cancer. Kayla [name] is unaware of previous family history of genetic testing for hereditary cancer risks. There is no reported Ashkenazi Jewish ancestry.    GENETIC TEST RESULTS:  The [facility] ColoNext Panel found no pathogenic mutations.   The ColoNext gene panel offered by [facility] and includes  sequencing, rearrangement, and RNA analysis for the following 20 genes:  APC, BMPR1A, CDH1, CHEK2, MLH1, MSH2, MSH6, MUTYH, NTHL1, PMS2, PTEN, SMAD4, STK11 and TP53, (sequencing and deletion/duplication); AXIN2, MSH3, POLD1 and POLE (sequencing only); EPCAM and GREM1 (deletion/duplication), only).  The test report has been scanned into EPIC and is located under the Molecular Pathology section of the Results Review tab.  A portion of the result report is included below for reference. Genetic testing reported out on 01/01/2023.      Even though a pathogenic variant was not identified, possible explanations for the cancer in the family may include: There may be no hereditary risk for cancer in the family. The cancers in her family may be due to other genetic or environmental factors. There may be a gene mutation in one of these genes that current testing methods cannot detect, but that chance is small. There could be another gene that has not yet been discovered, or that we have not yet tested, that is responsible for the cancer diagnoses in the family.  It is also possible there is a hereditary cause for the cancer in the family that Kayla [name] did not inherit.  Therefore, it is important to remain in touch with cancer genetics in the future so that we can continue to offer Kayla [name] the most up to date genetic testing.   CANCER SCREENING RECOMMENDATIONS:  Kayla [name]  test result is considered negative (normal).  This means that we have not identified a hereditary cause for her family history of cancer at this time. An individual's cancer risk and medical management are not determined by genetic test results alone. Overall cancer risk assessment incorporates additional factors, including personal medical history, family history, and any available genetic information that may result in a personalized plan for cancer prevention and surveillance. Therefore, it is recommended she continue to follow the  cancer management and screening guidelines provided by her healthcare providers.  Colon Cancer Screening: Due to Kayla [name]'s brother's history of colon cancer, she is recommended to repeat colonoscopies every 5 years. More frequent colonoscopies may be recommended if polyps are identified.  RECOMMENDATIONS FOR FAMILY MEMBERS:   Since she did not inherit a mutation in a cancer predisposition gene included on this panel, her children could not have inherited a mutation from her in one of these genes.  FOLLOW-UP:  Cancer genetics is a rapidly advancing field and it is possible that new genetic tests will be appropriate for her and/or her family members in the future. We encouraged her to remain in contact with cancer genetics on an annual basis so we can update her personal and family histories and let her know of advances in cancer genetics that may benefit this family.   Our contact number was provided. Kayla [name] questions were answered to her satisfaction, and she knows she is welcome to call us at anytime with additional questions or concerns.   Lucille Passy, MS, [facility] Genetic Counselor [email or web address] (P) [phone]

## 2023-01-15 NOTE — Telephone Encounter (Signed)
I don't see where she was referred. Please advise. Thank you

## 2023-01-19 ENCOUNTER — Ambulatory Visit: Payer: Medicaid Other | Admitting: Adult Health

## 2023-01-19 NOTE — Telephone Encounter (Signed)
Form for referral place on providers desk waiting for signature

## 2023-01-19 NOTE — Telephone Encounter (Signed)
Referral sent 

## 2023-01-23 ENCOUNTER — Encounter: Payer: Self-pay | Admitting: Genetic Counselor

## 2023-01-27 ENCOUNTER — Encounter: Payer: Self-pay | Admitting: Urology

## 2023-01-27 ENCOUNTER — Ambulatory Visit (INDEPENDENT_AMBULATORY_CARE_PROVIDER_SITE_OTHER): Payer: Medicaid Other | Admitting: Urology

## 2023-01-27 ENCOUNTER — Telehealth: Payer: Self-pay | Admitting: Gastroenterology

## 2023-01-27 VITALS — BP 115/72 | HR 89

## 2023-01-27 DIAGNOSIS — N281 Cyst of kidney, acquired: Secondary | ICD-10-CM

## 2023-01-27 DIAGNOSIS — Z800 Family history of malignant neoplasm of digestive organs: Secondary | ICD-10-CM

## 2023-01-27 DIAGNOSIS — R351 Nocturia: Secondary | ICD-10-CM

## 2023-01-27 DIAGNOSIS — D509 Iron deficiency anemia, unspecified: Secondary | ICD-10-CM

## 2023-01-27 DIAGNOSIS — R339 Retention of urine, unspecified: Secondary | ICD-10-CM

## 2023-01-27 LAB — URINALYSIS, ROUTINE W REFLEX MICROSCOPIC
Bilirubin, UA: NEGATIVE
Glucose, UA: NEGATIVE
Ketones, UA: NEGATIVE
Leukocytes,UA: NEGATIVE
Nitrite, UA: NEGATIVE
RBC, UA: NEGATIVE
Specific Gravity, UA: 1.03 (ref 1.005–1.030)
Urobilinogen, Ur: 0.2 mg/dL (ref 0.2–1.0)
pH, UA: 5.5 (ref 5.0–7.5)

## 2023-01-27 LAB — MICROSCOPIC EXAMINATION: Epithelial Cells (non renal): >10 /hpf — AB (ref 0–10)

## 2023-01-27 LAB — BLADDER SCAN AMB NON-IMAGING: Scan Result: 23

## 2023-01-27 NOTE — Progress Notes (Signed)
H&P  Chief Complaint: Multiple complaints, significant anxiety  History of Present Illness: Kayla [name] is a 52 y.o. year old female apparently seen before for some unknown urologic issue.  She comes in today significantly anxious with worries about cancer in her liver, pancreas and a renal abnormality.  She has a known left renal cyst approximately 10 mm in size.  She is not having lateralizing flank pain.  MRI of the abdomen done for possible hemangioma of the liver revealed findings consistent with the ultrasound diagnosis of simple left renal cyst.  Probable hemangioma of the liver.  Mild pancreatic abnormality.  The patient is worried about the difference in size of her kidneys.  She feels that she probably has pancreatic cancer.  She is worried about a genetic study done recently.  She also has nocturia x 3-4.  She is states that she snores but has never had a sleep study done.  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)    Endometrial polyp 09/01/2019   Pt aware, make appt with Dr Emelda Fear,    Fibroids 09/01/2019   Hypertension    Liver disease    Ovarian cyst    cervical polyp   Palpitations    possible bradycardia   Somatization disorder     Past Surgical History:  Procedure Laterality Date   None  05-22-12   hx of ovarian cysts    Home Medications:  (Not in a hospital admission)   Allergies:  Allergies  Allergen Reactions   Latex Itching and Rash    Family History  Problem Relation Age of Onset   Hypertension Mother    Diabetes Mother    Lymphoma Father 19   Colon cancer Brother 41   Melanoma Maternal Aunt    Lung cancer Maternal Uncle    Bone cancer Paternal Aunt    Lung cancer Paternal Uncle    Brain cancer Maternal Grandfather 22 - 46   Heart failure Other     Social History:  reports that she has never smoked. She has never used smokeless tobacco. She  reports that she does not drink alcohol and does not use drugs.  ROS: A complete review of systems was performed.  All systems are negative except for pertinent findings as noted.  Physical Exam:  Vital signs in last 24 hours: @ General:  Alert and oriented, No acute distress HEENT: Normocephalic, atraumatic Neck: No JVD or lymphadenopathy Cardiovascular: Regular rate  Lungs: Normal inspiratory/expiratory excursion  Extremities: No edema Neurologic: Grossly intact  I have reviewed prior pt notes  I have reviewed notes from referring/previous physicians  I have reviewed urinalysis results  I have independently reviewed prior imaging--MRI results, bladder scan results (minimal residual urine volume), ultrasound results   Impression/Assessment:  1.  Small simple renal cyst on the left, no further evaluation needed.  No other renal abnormality.  2.  Nocturia, question cause-perhaps sleep disorder  Plan:  1.  Patient reassured about her kidney function (GFR close to 110) as well as her small left renal cyst  2.  Reassurance regarding her nocturia, limit evening fluids, perhaps consider sleep study  3.  I will see back in 4 months for recheck  Chelsea Aus 01/27/2023, 3:49 PM  Bertram Millard. Ifeoluwa Bartz MD

## 2023-01-27 NOTE — Addendum Note (Signed)
Addended by: Armstead Peaks on: 01/27/2023 04:05 PM   Modules accepted: Orders

## 2023-01-27 NOTE — Telephone Encounter (Signed)
Referral placed on [facility]ney desk to be signed. Will fax once signed.

## 2023-01-27 NOTE — Telephone Encounter (Signed)
Patient declines having EGD and colonoscopy performed at [facility] by our team. Please send additional referral request to [facility] for colonoscopy in addition to her EGD/EUS for evaluation of anemia, family history of colon cancer, and dysphagia.   Brooke Bonito, MSN, APRN, FNP-BC, AGACNP-BC [facility] Gastroenterology at [facility]

## 2023-01-28 NOTE — Telephone Encounter (Signed)
Referral faxed

## 2023-01-29 ENCOUNTER — Other Ambulatory Visit: Payer: Self-pay | Admitting: Gastroenterology

## 2023-01-29 MED ORDER — CHOLESTYRAMINE 4 G PO PACK: 4.0000 g | PACK | Freq: Every day | ORAL | 12 refills | Status: DC

## 2023-02-02 ENCOUNTER — Encounter: Payer: Self-pay | Admitting: Urology

## 2023-02-08 ENCOUNTER — Encounter: Payer: Self-pay | Admitting: Urology

## 2023-02-09 ENCOUNTER — Encounter: Payer: Self-pay | Admitting: *Deleted

## 2023-02-09 ENCOUNTER — Encounter (INDEPENDENT_AMBULATORY_CARE_PROVIDER_SITE_OTHER): Payer: Self-pay | Admitting: *Deleted

## 2023-02-09 ENCOUNTER — Telehealth: Payer: Self-pay

## 2023-02-09 ENCOUNTER — Encounter: Payer: Self-pay | Admitting: Urology

## 2023-02-09 ENCOUNTER — Encounter: Payer: Self-pay | Admitting: Obstetrics & Gynecology

## 2023-02-09 NOTE — Telephone Encounter (Signed)
Was not marked as urgent. Request re faxed as urgent to see

## 2023-02-09 NOTE — Telephone Encounter (Signed)
Called patient after sending my chart message. Made patient aware to contact her primary care regarding the issue with the swelling of her legs. Patient states that she takes blood pressure medication and drink lot of fluids durning the day, made patient aware that blood pressure medication has a diuretics that could possibly cause her to void constantly at night. Made patient aware to decrease fluid intake and follow up with her primary care. Patient voiced understanding

## 2023-02-11 ENCOUNTER — Other Ambulatory Visit: Payer: Self-pay | Admitting: Gastroenterology

## 2023-02-11 DIAGNOSIS — D509 Iron deficiency anemia, unspecified: Secondary | ICD-10-CM

## 2023-02-11 DIAGNOSIS — R197 Diarrhea, unspecified: Secondary | ICD-10-CM

## 2023-02-12 LAB — COMPREHENSIVE METABOLIC PANEL
ALT: 12 IU/L (ref 0–32)
AST: 19 IU/L (ref 0–40)
Albumin/Globulin Ratio: 1.6 (ref 1.2–2.2)
Albumin: 4.1 g/dL (ref 3.9–4.9)
Alkaline Phosphatase: 89 IU/L (ref 44–121)
BUN/Creatinine Ratio: 23 (ref 9–23)
BUN: 16 mg/dL (ref 6–24)
Bilirubin Total: 0.3 mg/dL (ref 0.0–1.2)
CO2: 23 mmol/L (ref 20–29)
Calcium: 8.9 mg/dL (ref 8.7–10.2)
Chloride: 103 mmol/L (ref 96–106)
Creatinine, Ser: 0.71 mg/dL (ref 0.57–1.00)
Globulin, Total: 2.6 g/dL (ref 1.5–4.5)
Glucose: 91 mg/dL (ref 70–99)
Potassium: 4.4 mmol/L (ref 3.5–5.2)
Sodium: 141 mmol/L (ref 134–144)
Total Protein: 6.7 g/dL (ref 6.0–8.5)
eGFR: 104 mL/min/{1.73_m2} (ref >59)

## 2023-02-12 LAB — IRON,TIBC AND FERRITIN PANEL
Ferritin: 12 ng/mL — ABNORMAL LOW (ref 15–150)
Iron Saturation: 12 % — ABNORMAL LOW (ref 15–55)
Iron: 45 ug/dL (ref 27–159)
Total Iron Binding Capacity: 374 ug/dL (ref 250–450)
UIBC: 329 ug/dL (ref 131–425)

## 2023-02-12 LAB — CBC
Hematocrit: 34.2 % (ref 34.0–46.6)
Hemoglobin: 10.7 g/dL — ABNORMAL LOW (ref 11.1–15.9)
MCH: 25.6 pg — ABNORMAL LOW (ref 26.6–33.0)
MCHC: 31.3 g/dL — ABNORMAL LOW (ref 31.5–35.7)
MCV: 82 fL (ref 79–97)
Platelets: 243 10*3/uL (ref 150–450)
RBC: 4.18 x10E6/uL (ref 3.77–5.28)
RDW: 17 % — ABNORMAL HIGH (ref 11.7–15.4)
WBC: 5.7 10*3/uL (ref 3.4–10.8)

## 2023-02-12 NOTE — Telephone Encounter (Signed)
Patient continues to message daily with many questions. Please see below

## 2023-02-18 ENCOUNTER — Telehealth: Payer: Self-pay | Admitting: Internal Medicine

## 2023-02-18 NOTE — Telephone Encounter (Signed)
Noted  

## 2023-02-18 NOTE — Telephone Encounter (Signed)
Patient has an appointment with [facility] on 5/20 at 1030. There is a CD disk that belongs to the patient that Brooke Bonito, NP asked to give back to patient. It is up front in the sample drawer labeled in envelope.

## 2023-03-01 NOTE — Progress Notes (Deleted)
Referring Provider: Toma Deiters, MD Primary Care Physician:  Toma Deiters, MD Primary GI Physician: Dr. Marletta Lor  No chief complaint on file.   HPI:   Kayla [name] is a 52 y.o. female with history of anxiety, bipolar, HTN, cervical polyps, endometrial polyps, hepatic steatosis, IDA at least since 2021, GERD, diarrhea, dysphagia, and weight loss over the last year or so. Fhx significant for brother with colon cancer age 30.  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   CA 125 27 August 2022.  Lynch Syndrome genetic testing negative.  Cologuard negative May 2023.   February 2024, CA 19-9 elevated at 39, CEA within normal limits, hemoglobin 11.0, ferritin 3, saturation 7%, iron 30, fecal elastase greater than 500  She was referred for genetic testing per her request. ColoNext gene panel found no pathologic mutations. Recommended yearly follow-up with cancer genetics.   We have recommended EGD and colonoscopy since 2021, but patient has declined to have procedures at [facility]. She has been referred to [facility] for EGD/Colonoscopy as well as EUS/FNA to evaluate pancreatic cyst further.   Numerous messages back and forth between patient and provider regarding various different concerns including cancer concern, concern about length of time it is taking to get to [facility]. Patient has expressed her frustration about the waiting process, made accusations about provider lying in regards to when and how the referral was sent to [facility], and has been inappropriate in her behavior. Ultimately, the decision was made to discharge the patient from the practice.   Today:         Last seen in our office 12/02/22.  Reported intermittent rectal bleeding, constant fatigue, diarrhea with urgency after meals, weight loss.  She had documented 12 pound weight loss over the last 10 months.  She was concerned about her kidneys due to cyst noted on recent imaging as well as difficulty with urination and has been urinating more at night. Also concerned about cysts/neoplasms on her pancreas. Has had some RLQ pain and back pain.  She was very concerned about cancer given her family history of brother with colon cancer and other various cancers within her family. Recommended EGD and colonoscopy, but patient requested blood work/other labs to rule out other issues.  Planned for CA 19-9, CEA, fecal elastase, iron panel, CBC, CMP.  Patient did not want to wait 1 year for MRI/MRCP, so recommended 63-month interval.  She was also referred to genetic counseling and urology per her request.  Recommended continuing oral iron, Imodium as needed.    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 Take 1 hour after your morning medications.  Do not take any other medications 4 to 6 hours after you take this dose. 60 each 12   diltiazem (CARDIZEM CD) 120 MG 24 hr capsule Take 120 mg by mouth daily.     Ferrous Sulfate (IRON PO) Take by mouth 3 (three) times a week.     No current facility-administered medications for this visit.    Allergies as of 03/02/2023 - Review Complete  01/27/2023  Allergen Reaction Noted   Latex Itching and Rash 05/19/2012    Family History  Problem Relation Age of Onset   Hypertension Mother    Diabetes Mother    Lymphoma Father 12   Colon cancer Brother 96   Melanoma Maternal Aunt    Lung cancer Maternal Uncle    Bone cancer Paternal Aunt    Lung cancer Paternal Uncle    Brain cancer Maternal Grandfather 56 - 55   Heart failure Other     Social History   Socioeconomic History   Marital status: Divorced    Spouse name: Not on file   Number of children: Not on file   Years of education: Not on file   Highest education level: Not on file  Occupational History   Occupation: Unemployed  Tobacco Use   Smoking status: Never   Smokeless tobacco: Never  Vaping Use   Vaping Use: Never used  Substance and Sexual Activity   Alcohol use: No   Drug use: No   Sexual activity: Not Currently    Birth control/protection: None  Other Topics Concern   Not on file  Social History Narrative   Divorced.  Lives with her mother and 3 children.     Social Determinants of Health   Financial Resource Strain: Low Risk  (12/25/2020)   Overall Financial Resource Strain (CARDIA)    Difficulty of Paying Living Expenses: Not very hard  Food Insecurity: Food Insecurity Present (12/25/2020)   Hunger Vital Sign    Worried About Running Out of Food in the Last Year: Sometimes true    Ran Out of Food in the Last Year: Sometimes true  Transportation Needs: No Transportation Needs (12/25/2020)   PRAPARE - Administrator, Civil Service (Medical): No    Lack of Transportation (Non-Medical): No  Physical Activity: Sufficiently Active (12/25/2020)   Exercise Vital Sign    Days of Exercise per Week: 7 days    Minutes of Exercise per Session: 30 min  Stress: Stress Concern Present (12/25/2020)   [employer] of Occupational Health - Occupational Stress Questionnaire    Feeling of Stress : To some extent  Social Connections: Socially  Isolated (12/25/2020)   Social Connection and Isolation Panel [NHANES]    Frequency of Communication with Friends and Family: More than three times a week    Frequency of Social Gatherings with Friends and Family: Twice a week    Attends Religious Services: Never    Database administrator or Organizations: No    Attends Engineer, structural: Never    Marital Status: Divorced    Review of Systems: Gen: Denies fever, chills, anorexia. Denies fatigue, weakness, weight loss.  CV: Denies chest pain, palpitations, syncope, peripheral edema, and claudication. Resp: Denies dyspnea at rest, cough, wheezing, coughing up blood, and pleurisy. GI: Denies vomiting blood, jaundice, and fecal incontinence.   Denies dysphagia or odynophagia. Derm: Denies rash, itching, dry skin Psych: Denies depression, anxiety,  memory loss, confusion. No homicidal or suicidal ideation.  Heme: Denies bruising, bleeding, and enlarged lymph nodes.  Physical Exam: There were no vitals taken for this visit. General:   Alert and oriented. No distress noted. Pleasant and cooperative.  Head:  Normocephalic and atraumatic. Eyes:  Conjuctiva clear without scleral icterus. Heart:  S1, S2 present without murmurs appreciated. Lungs:  Clear to auscultation bilaterally. No wheezes, rales, or rhonchi. No distress.  Abdomen:  +BS, soft, non-tender and non-distended. No rebound or guarding. No HSM or masses noted. Msk:  Symmetrical without gross deformities. Normal posture. Extremities:  Without edema. Neurologic:  Alert and  oriented x4 Psych:  Normal mood and affect.    Assessment:     Plan:  ***   Ermalinda Memos, PA-C [facility] Gastroenterology 03/02/2023

## 2023-03-02 ENCOUNTER — Ambulatory Visit: Payer: Medicaid Other | Admitting: Gastroenterology

## 2023-03-04 ENCOUNTER — Ambulatory Visit: Payer: Medicaid Other | Admitting: Adult Health

## 2023-03-19 ENCOUNTER — Telehealth: Payer: Self-pay | Admitting: Gastroenterology

## 2023-03-19 NOTE — Telephone Encounter (Signed)
I have printed off patient's medical records and have her CD with it. Patient is aware that I have mailed her a release of records to sign and get back to me. She is requesting I call Humphrey Rolls at [facility] to get their address. I don't know which facility, department, etc. I confirmed patient's address because she hadn't received it in 2 days. I told her the mail was slow and she should be getting it soon or she could come to office, sign and pick up her records/CD here. I have her records in the top drawer of my file cabinet.

## 2023-04-13 ENCOUNTER — Encounter: Payer: Self-pay | Admitting: Urology

## 2023-04-15 ENCOUNTER — Telehealth: Payer: Self-pay | Admitting: Adult Health

## 2023-04-15 ENCOUNTER — Telehealth: Payer: Self-pay

## 2023-04-15 NOTE — Telephone Encounter (Signed)
Patient called to make appointments. She was wanting to see if you could order a ultrasound for her at [facility]. Please advise.

## 2023-04-15 NOTE — Telephone Encounter (Signed)
Pt responded through MyChart. JSY

## 2023-04-15 NOTE — Telephone Encounter (Signed)
Patient called and advised that she was seen by Dr. Retta Diones on 01/27/23 for renal cysts. Patient advised that she was upset due to Dr. Retta Diones requesting a urinalysis and confirming that she had an infection but declined to provide antibiotics per patient. She went to her PCP that day and they performed another urinalysis and prescribed her an antibiotic. She advised that during the visit he confirmed that she had renal cysyts. She voiced that she had concerns with "polycystic renal disease" She went to [facility] due to swollen legs. Her provider there advised that it could be cause by "polycystic renal disease". She is unable to keep any solid food down and is vomiting yellow bile.   She wanted to know if she could see another provider due to [facility] being out. She advised she did not feel like her concerns were being addressed and she was not being heard. I made her aware I would forward the message to you.

## 2023-04-20 NOTE — Telephone Encounter (Signed)
Yes, she may see someone else.

## 2023-04-27 ENCOUNTER — Ambulatory Visit (INDEPENDENT_AMBULATORY_CARE_PROVIDER_SITE_OTHER): Payer: MEDICAID | Admitting: Urology

## 2023-04-27 ENCOUNTER — Encounter: Payer: Self-pay | Admitting: Urology

## 2023-04-27 VITALS — BP 113/73 | HR 77

## 2023-04-27 DIAGNOSIS — R310 Gross hematuria: Secondary | ICD-10-CM | POA: Diagnosis not present

## 2023-04-27 DIAGNOSIS — R351 Nocturia: Secondary | ICD-10-CM | POA: Diagnosis not present

## 2023-04-27 DIAGNOSIS — N281 Cyst of kidney, acquired: Secondary | ICD-10-CM | POA: Diagnosis not present

## 2023-04-27 LAB — URINALYSIS, ROUTINE W REFLEX MICROSCOPIC
Bilirubin, UA: NEGATIVE
Glucose, UA: NEGATIVE
Leukocytes,UA: NEGATIVE
Nitrite, UA: NEGATIVE
Protein,UA: NEGATIVE
RBC, UA: NEGATIVE
Specific Gravity, UA: 1.025 (ref 1.005–1.030)
Urobilinogen, Ur: 0.2 mg/dL (ref 0.2–1.0)
pH, UA: 6 (ref 5.0–7.5)

## 2023-04-27 LAB — BLADDER SCAN AMB NON-IMAGING: Scan Result: 2

## 2023-04-27 NOTE — Progress Notes (Unsigned)
04/27/2023 9:14 AM   Kayla [name] 07-02-1971 [id number]  Referring provider: Toma Deiters, MD [address] [place],  [place] [address]  No chief complaint on file.   HPI:  New patient for me-  1) renal cyst-patient underwent an abdominal ultrasound January 2024 which showed a 12 mm left renal cyst.  This appeared endophytic.  She had a follow-up February 2024 MRI at [facility] which showed an 11 mm slightly irregular renal cyst which was exophytic and a 7 mm pancreatic head lesion. Due for MRI Feb 2025.   2) nocturia, urgency - patient has nocturia x 3-4.  She snores and a sleep study was recommended.  Today, seen for the above.  She was recently seen by Dr. Retta Diones.  She was seen July 4 and even then complained of dysuria, nausea and decreased appetite. She had gross hematuria - red urine. She showed me a picture. Creatinine was 0.74.  White count 11.9, hemoglobin 10.4.  UA with 2 RBC, 6 WBC, moderate bacteria.  She underwent acute abdominal series which was benign.  She was treated with cephalexin.  Her UA today is clear.  She is getting another MRI in Aug 2024.    PMH: 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)    Endometrial polyp 09/01/2019   Pt aware, make appt with Dr Emelda Fear,    Fibroids 09/01/2019   Hypertension    Liver disease    Ovarian cyst    cervical polyp   Palpitations    possible bradycardia   Somatization disorder     Surgical History: Past Surgical History:  Procedure Laterality Date   None  05-22-12   hx of ovarian cysts    Home Medications:  Allergies as of 04/27/2023       Reactions   Latex Itching, Rash        Medication List        Accurate as of April 27, 2023  9:14 AM. If you have any questions, ask your nurse or doctor.          cholestyramine 4 g packet Commonly known as: QUESTRAN Take 1 packet (4 g total) by  mouth daily. Take 1 hour after your morning medications.  Do not take any other medications 4 to 6 hours after you take this dose.   diltiazem 120 MG 24 hr capsule Commonly known as: CARDIZEM CD Take 120 mg by mouth daily.   IRON PO Take by mouth 3 (three) times a week.        Allergies:  Allergies  Allergen Reactions   Latex Itching and Rash    Family History: Family History  Problem Relation Age of Onset   Hypertension Mother    Diabetes Mother    Lymphoma Father 50   Colon cancer Brother 101   Melanoma Maternal Aunt    Lung cancer Maternal Uncle    Bone cancer Paternal Aunt    Lung cancer Paternal Uncle    Brain cancer Maternal Grandfather 66 - 1   Heart failure Other     Social History:  reports that she has never smoked. She has never used smokeless tobacco. She reports that she does not drink alcohol and does not use drugs.   Physical Exam: BP 113/73   Pulse 77   Constitutional:  Alert and oriented, No acute distress. HEENT: [place] AT, moist mucus membranes.  Trachea midline, no masses. Cardiovascular: No clubbing, cyanosis, or edema. Respiratory: Normal respiratory effort, no increased work of breathing. GI: Abdomen is soft, nontender, nondistended, no abdominal masses GU: No CVA tenderness Skin: No rashes, bruises or suspicious lesions. Neurologic: Grossly intact, no focal deficits, moving all 4 extremities. Psychiatric: Normal mood and affect.  Laboratory Data: Lab Results  Component Value Date   WBC 5.7 02/11/2023   HGB 10.7 (L) 02/11/2023   HCT 34.2 02/11/2023   MCV 82 02/11/2023   PLT 243 02/11/2023    Lab Results  Component Value Date   CREATININE 0.71 02/11/2023    No results found for: "PSA"  No results found for: "TESTOSTERONE"  Lab Results  Component Value Date   HGBA1C 5.7 (H) 03/25/2012    Urinalysis    Component Value Date/Time   COLORURINE YELLOW 04/21/2013 1923   APPEARANCEUR Clear 01/27/2023 1448   LABSPEC 1.020  04/21/2013 1923   PHURINE 7.0 04/21/2013 1923   GLUCOSEU Negative 01/27/2023 1448   HGBUR TRACE (A) 04/21/2013 1923   BILIRUBINUR Negative 01/27/2023 1448   KETONESUR negative 07/16/2022 1845   KETONESUR >80 (A) 04/21/2013 1923   PROTEINUR 1+ (A) 01/27/2023 1448   PROTEINUR TRACE (A) 04/21/2013 1923   UROBILINOGEN 0.2 07/16/2022 1845   UROBILINOGEN 1.0 04/21/2013 1923   NITRITE Negative 01/27/2023 1448   NITRITE NEGATIVE 04/21/2013 1923   LEUKOCYTESUR Negative 01/27/2023 1448    Lab Results  Component Value Date   LABMICR See below: 01/27/2023   WBCUA 0-5 01/27/2023   LABEPIT >10 (A) 01/27/2023   BACTERIA Few (A) 01/27/2023    Pertinent Imaging: Renal US, MRI images - on Canopy   Assessment & Plan:    1. Renal cyst She was reassured and we will continue to monitor. The MRI will also image her kidneys.  - Urinalysis, Routine w reflex microscopic - BLADDER SCAN AMB NON-IMAGING  2. Urgency, nocturia - I recommended a sleep study with her PCP. We also discussed OAB med and she declined.   3. Gross hematuria  - will bring back for cystoscopy and exam.   No follow-ups on file.  Jerilee Field, MD  [facility]  [address] [place], [place] [address] [phone]

## 2023-05-05 ENCOUNTER — Encounter: Payer: Self-pay | Admitting: Adult Health

## 2023-05-05 ENCOUNTER — Other Ambulatory Visit (HOSPITAL_COMMUNITY)
Admission: RE | Admit: 2023-05-05 | Discharge: 2023-05-05 | Disposition: A | Discharge status: Home / Self Care | Payer: MEDICAID | Source: Ambulatory Visit | Attending: Adult Health | Admitting: Adult Health

## 2023-05-05 ENCOUNTER — Ambulatory Visit (INDEPENDENT_AMBULATORY_CARE_PROVIDER_SITE_OTHER): Payer: MEDICAID | Admitting: Adult Health

## 2023-05-05 VITALS — BP 123/79 | HR 83 | Ht 64.0 in | Wt 184.0 lb

## 2023-05-05 DIAGNOSIS — N921 Excessive and frequent menstruation with irregular cycle: Secondary | ICD-10-CM | POA: Diagnosis not present

## 2023-05-05 DIAGNOSIS — Z124 Encounter for screening for malignant neoplasm of cervix: Secondary | ICD-10-CM | POA: Diagnosis not present

## 2023-05-05 DIAGNOSIS — R102 Pelvic and perineal pain: Secondary | ICD-10-CM | POA: Diagnosis not present

## 2023-05-05 DIAGNOSIS — N926 Irregular menstruation, unspecified: Secondary | ICD-10-CM | POA: Insufficient documentation

## 2023-05-05 NOTE — Progress Notes (Signed)
Patient ID: Kayla [name], female   DOB: 21-Oct-1970, 52 y.o.   MRN: [id number] History of Present Illness: Kayla [name] is a 52 year old white female, divorced, G3P3003, in complaining of low pelvic pain and periods are not irregular, period in June lasted 14 days and was heavy with clots, then just spotted in July and now has yellow discharge, no burning  or itching. Pap performed today, at her request. She is worried she has cancer somewhere, has seen GI and kidney doctors and PCP. Had MRI at [facility] 04/30/23 she says, and results not back.  PCP is Dr Olena Leatherwood   Current Medications, Allergies, Past Medical History, Past Surgical History, Family History and Social History were reviewed in [employer] electronic medical record.     Review of Systems: +pelvic pain Periods irregular and heavy at times  No sex in years  She says legs swell  +yellow discharge Denies burning or itching   Physical Exam:BP 123/79 (BP Location: Left Arm, Patient Position: Sitting, Cuff Size: Normal)   Pulse 83   Ht 5\' 4"  (1.626 m)   Wt 184 lb (83.5 kg)   BMI 31.58 kg/m   General:  Well developed, well nourished, no acute distress Skin:  Warm and dry Lungs; Clear to auscultation bilaterally Cardiovascular: Regular rate and rhythm Abdomen:  Soft, no hepatosplenomegaly, mild epigastric tenderness  Pelvic:  External genitalia is normal in appearance, no lesions.  The vagina is normal in appearance, has yellow discharge, no odor. Urethra has no lesions or masses. The cervix is friable.Pap with GC/CHL and HR HPV genotyping performed.  Uterus is felt to be slightly enlarged,normal shape, and contour, mildly tender.  No adnexal masses, +LLQ tenderness noted.Bladder is non tender, no masses felt. Extremities/musculoskeletal:  No pitting edema or varicosities noted, no clubbing or cyanosis, has chiggers on BLE Psych:   alert and cooperative,seems happy, but anxious  Fall risk Is low  Upstream - 05/05/23 0858        Pregnancy Intention Screening   Does the patient want to become pregnant in the next year? N/A    Does the patient's partner want to become pregnant in the next year? N/A    Would the patient like to discuss contraceptive options today? N/A      Contraception Wrap Up   Current Method Abstinence    End Method Abstinence    Contraception Counseling Provided No             Examination chaperoned by Malachy Mood LPN  Impression and Plan: 1. Routine Papanicolaou smear Pap sent - Cytology - PAP( [place])  2. Pelvic pain +low pelvic pain Scheduled pelvic US 05/12/23 at 9 am at [facility] to assess uterus and ovaries  - US PELVIC COMPLETE WITH TRANSVAGINAL; Future  3. Irregular bleeding Had 14 days period with clots and heavy in June and then spotted in July   4. Menorrhagia with irregular cycle Had heavy period in June Will get [place] to assess uterus  - US PELVIC COMPLETE WITH TRANSVAGINAL; Future

## 2023-05-07 LAB — CYTOLOGY - PAP
Comment: NEGATIVE
Comment: NORMAL
Diagnosis: UNDETERMINED — AB
High risk HPV: NEGATIVE
Neisseria Gonorrhea: NEGATIVE

## 2023-05-08 ENCOUNTER — Encounter: Payer: Self-pay | Admitting: Adult Health

## 2023-05-08 DIAGNOSIS — R87610 Atypical squamous cells of undetermined significance on cytologic smear of cervix (ASC-US): Secondary | ICD-10-CM | POA: Insufficient documentation

## 2023-05-11 ENCOUNTER — Encounter: Payer: Self-pay | Admitting: Obstetrics & Gynecology

## 2023-05-12 ENCOUNTER — Ambulatory Visit (HOSPITAL_COMMUNITY)
Admission: RE | Admit: 2023-05-12 | Discharge: 2023-05-12 | Disposition: A | Discharge status: Home / Self Care | Payer: MEDICAID | Source: Ambulatory Visit | Attending: Adult Health | Admitting: Adult Health

## 2023-05-12 DIAGNOSIS — N921 Excessive and frequent menstruation with irregular cycle: Secondary | ICD-10-CM | POA: Diagnosis present

## 2023-05-12 DIAGNOSIS — R102 Pelvic and perineal pain: Secondary | ICD-10-CM | POA: Insufficient documentation

## 2023-05-15 ENCOUNTER — Encounter: Payer: Self-pay | Admitting: Obstetrics & Gynecology

## 2023-05-26 ENCOUNTER — Ambulatory Visit: Payer: Medicaid Other | Admitting: Urology

## 2023-06-01 ENCOUNTER — Ambulatory Visit: Payer: MEDICAID | Admitting: Adult Health

## 2023-06-01 ENCOUNTER — Encounter: Payer: Self-pay | Admitting: Adult Health

## 2023-06-01 VITALS — BP 125/71 | HR 81 | Ht 65.0 in | Wt 184.0 lb

## 2023-06-01 DIAGNOSIS — N898 Other specified noninflammatory disorders of vagina: Secondary | ICD-10-CM | POA: Insufficient documentation

## 2023-06-01 DIAGNOSIS — Z01419 Encounter for gynecological examination (general) (routine) without abnormal findings: Secondary | ICD-10-CM | POA: Insufficient documentation

## 2023-06-01 LAB — POCT WET PREP (WET MOUNT): Clue Cells Wet Prep Whiff POC: NEGATIVE

## 2023-06-01 MED ORDER — METRONIDAZOLE 0.75 % VA GEL: VAGINAL | 0 refills | Status: DC

## 2023-06-01 NOTE — Progress Notes (Signed)
Patient ID: Kayla [name], female   DOB: 07-26-1971, 52 y.o.   MRN: [id number] History of Present Illness: Kayla [name] is a 52 year old white female, divorced, G3P3003, in for well woman gyn exam, she is complaining of yellowish discharge with odor at times. She is stressed over her health.      Component Value Date/Time   DIAGPAP (A) 05/05/2023 0922    - Atypical squamous cells of undetermined significance (ASC-US)   DIAGPAP  12/25/2020 1113    - Negative for Intraepithelial Lesions or Malignancy (NILM)   DIAGPAP - Benign reactive/reparative changes 12/25/2020 1113   HPVHIGH Negative 05/05/2023 0922   HPVHIGH Negative 12/25/2020 1113   HPVHIGH Negative 10/27/2019 1002   ADEQPAP  05/05/2023 0922    Satisfactory for evaluation; transformation zone component PRESENT.   ADEQPAP  12/25/2020 1113    Satisfactory for evaluation; transformation zone component PRESENT.   ADEQPAP  10/27/2019 1002    Satisfactory for evaluation; transformation zone component PRESENT.   PCP is Dr Olena Leatherwood.    Current Medications, Allergies, Past Medical History, Past Surgical History, Family History and Social History were reviewed in [employer] record.     Review of Systems: Patient denies any headaches, hearing loss, fatigue, blurred vision, shortness of breath, chest pain, abdominal pain, problems with bowel movements, urination, or intercourse(not active). No joint pain or mood swings.  Has yellowish discharge, has odor at times, has noticed itching for 1 day +stress   Physical Exam:BP 125/71 (BP Location: Left Arm, Patient Position: Sitting, Cuff Size: Normal)   Pulse 81   Ht 5\' 5"  (1.651 m)   Wt 184 lb (83.5 kg)   LMP 05/12/2023   BMI 30.62 kg/m   General:  Well developed, well nourished, no acute distress Skin:  Warm and dry Neck:  Midline trachea, normal thyroid, good ROM, no lymphadenopathy Lungs; Clear to auscultation bilaterally Breast:  No dominant palpable mass,  retraction, or nipple discharge Cardiovascular: Regular rate and rhythm Abdomen:  Soft, non tender, no hepatosplenomegaly Pelvic:  External genitalia is normal in appearance, no lesions.  The vagina is normal in appearance,has white creamy discharge without an odor. Urethra has no lesions or masses. The cervix is smooth.  Uterus is felt to be normal size, shape, and contour.  No adnexal masses or tenderness noted.Bladder is non tender, no masses felt. Wet prep, has few WBCs Rectal: Deferred Extremities/musculoskeletal:  No swelling or varicosities noted, no clubbing or cyanosis, has scarring from scratching, had chiggers she said  Psych:  No mood changes, alert and cooperative,seems happy AA is 0 Fall risk is low    06/01/2023    9:35 AM 12/25/2020   11:00 AM 11/03/2019   10:43 AM  Depression screen PHQ 2/9  Decreased Interest 0 0 0  Down, Depressed, Hopeless 0 0 0  PHQ - 2 Score 0 0 0  Altered sleeping 1 0 0  Tired, decreased energy 1 1 0  Change in appetite 1 1 0  Feeling bad or failure about yourself  0 0 1  Trouble concentrating 0 0 0  Moving slowly or fidgety/restless 0 0 3  Suicidal thoughts 0 0 0  PHQ-9 Score 3 2 4   Difficult doing work/chores  Not difficult at all Not difficult at all       06/01/2023    9:35 AM 12/25/2020   11:01 AM 11/03/2019   10:41 AM  GAD 7 : Generalized Anxiety Score  Nervous, Anxious, on Edge 1 0 1  Control/stop worrying 1 1 0  Worry too much - different things 1 1 3   Trouble relaxing 1 1 0  Restless 0 1 0  Easily annoyed or irritable 0 1 1  Afraid - awful might happen 0 1 1  Total GAD 7 Score 4 6 6   Anxiety Difficulty   Not difficult at all    Upstream - 06/01/23 0935       Pregnancy Intention Screening   Does the patient want to become pregnant in the next year? No    Does the patient's partner want to become pregnant in the next year? No    Would the patient like to discuss contraceptive options today? No      Contraception Wrap Up    Current Method Abstinence    End Method Abstinence    Contraception Counseling Provided No              Examination chaperoned by Malachy Mood LPN    Impression and Plan: 1. Encounter for well woman exam with routine gynecological exam Physical in 1 year Pap in 2027 Get mammogram  Colonoscopy per GI Labs with PCP  2. Vaginal discharge Has discharge with odor at times Will rx metrogel, will let me know if helps  Meds ordered this encounter  Medications   metroNIDAZOLE (METROGEL) 0.75 % vaginal gel    Sig: Use 1 applicator in vagina if having odor in discharge    Dispense:  70 g    Refill:  0    Order Specific Question:   Supervising Provider    Answer:   Duane Lope H [[id number]]

## 2023-06-29 ENCOUNTER — Encounter (HOSPITAL_COMMUNITY): Payer: Self-pay

## 2023-06-29 ENCOUNTER — Other Ambulatory Visit: Payer: MEDICAID | Admitting: Urology

## 2023-06-29 ENCOUNTER — Emergency Department (HOSPITAL_COMMUNITY)
Admission: EM | Admit: 2023-06-29 | Discharge: 2023-06-29 | Disposition: A | Discharge status: Home / Self Care | Payer: MEDICAID | Attending: Emergency Medicine | Admitting: Emergency Medicine

## 2023-06-29 ENCOUNTER — Other Ambulatory Visit: Payer: Self-pay

## 2023-06-29 DIAGNOSIS — L089 Local infection of the skin and subcutaneous tissue, unspecified: Secondary | ICD-10-CM | POA: Diagnosis present

## 2023-06-29 DIAGNOSIS — F419 Anxiety disorder, unspecified: Secondary | ICD-10-CM | POA: Insufficient documentation

## 2023-06-29 DIAGNOSIS — R21 Rash and other nonspecific skin eruption: Secondary | ICD-10-CM | POA: Insufficient documentation

## 2023-06-29 DIAGNOSIS — I10 Essential (primary) hypertension: Secondary | ICD-10-CM | POA: Insufficient documentation

## 2023-06-29 DIAGNOSIS — Z91040 Latex allergy status: Secondary | ICD-10-CM | POA: Diagnosis not present

## 2023-06-29 DIAGNOSIS — F424 Excoriation (skin-picking) disorder: Secondary | ICD-10-CM | POA: Diagnosis not present

## 2023-06-29 DIAGNOSIS — T148XXA Other injury of unspecified body region, initial encounter: Secondary | ICD-10-CM

## 2023-06-29 DIAGNOSIS — R4589 Other symptoms and signs involving emotional state: Secondary | ICD-10-CM

## 2023-06-29 MED ORDER — CEPHALEXIN 500 MG PO CAPS: 500.0000 mg | ORAL_CAPSULE | Freq: Four times a day (QID) | ORAL | 0 refills | Status: DC

## 2023-06-29 MED ORDER — CEPHALEXIN 500 MG PO CAPS
500.0000 mg | ORAL_CAPSULE | Freq: Once | ORAL | Status: AC
  Administered 2023-06-29: 500 mg via ORAL
  Filled 2023-06-29: qty 1

## 2023-06-29 MED ORDER — FAMOTIDINE 20 MG PO TABS: 20.0000 mg | ORAL_TABLET | Freq: Two times a day (BID) | ORAL | 0 refills | Status: DC

## 2023-06-29 MED ORDER — TRIAMCINOLONE ACETONIDE 0.1 % EX CREA: 1.0000 | TOPICAL_CREAM | Freq: Two times a day (BID) | CUTANEOUS | 0 refills | Status: DC

## 2023-06-29 MED ORDER — PREDNISONE 20 MG PO TABS
40.0000 mg | ORAL_TABLET | Freq: Once | ORAL | Status: AC
  Administered 2023-06-29: 40 mg via ORAL
  Filled 2023-06-29: qty 2

## 2023-06-29 MED ORDER — FAMOTIDINE 20 MG PO TABS
20.0000 mg | ORAL_TABLET | Freq: Once | ORAL | Status: AC
  Administered 2023-06-29: 20 mg via ORAL
  Filled 2023-06-29: qty 1

## 2023-06-29 MED ORDER — PERMETHRIN 5 % EX CREA: TOPICAL_CREAM | CUTANEOUS | 0 refills | Status: DC

## 2023-06-29 NOTE — ED Notes (Signed)
Pt called me in there to say, " I went to [facility] a few days ago and everytime I go there I am seen in like 5 min." She also wants to know if we can draw blood to find out what is inside her (bug) and larva.Marland Kitchen?

## 2023-06-29 NOTE — ED Provider Notes (Signed)
Kayla [name] Provider Note   CSN: [id number] Arrival date & time: 06/29/23  2440     History  Chief Complaint  Patient presents with   Skin Problem    Kayla [name] is a 52 y.o. female with PMH as listed below who arrives [facility] from home with a recently diagnosed bacterial infection of the skin. Started Doxycycline <2 days ago and has not seen improvement.  She has had skin rash "little holes" for weeks and it is itchy and getting worse. She states she has had spreading of the infection and is very concerned that she may have MRSA and may become septic.  She is also concerned that she may have scabies.  She states that the itching does get worse at night but is also "worse all the time."  She states that one of the provider she has seen for this issue thought she may have fleas but she does not think it is fleas.  She had an appointment with dermatology this week but felt too sick to go.  She is extremely anxious and tearful on exam and repeatedly asking if this is going to last forever.  She states that she has been treated for ticks in the past but thinks she has "itch mites" and that she has "seen them burrowing into her skin and seen them we have larvae in her skin."  She states she has photos and videos of this on her phone but unfortunately left her phone at home.  She notes that the rash and redness has extended up from her legs onto her abdomen and under her left breast.  She has noted indentations on the left breast and worried she may have breast cancer. Denies fevers, nipple discharge, h/o breast cancer, vesicles/skin peeling/sloughing.   Per chart review, patient has seen multiple providers and sent multiple messages to her PCP about her symptoms, also stating that she was crying, very worried, and feels like God has cursed her.     Past Medical History:  Diagnosis Date   Anxiety    Panic attacks; somatization   Bipolar disorder  (HCC)    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)    Endometrial polyp 09/01/2019   Pt aware, make appt with Dr Emelda Fear,    Fibroids 09/01/2019   Hypertension    Liver disease    Ovarian cyst    cervical polyp   Palpitations    possible bradycardia   Somatization disorder        Home Medications Prior to Admission medications   Medication Sig Start Date End Date Taking? Authorizing Provider  cephALEXin (KEFLEX) 500 MG capsule Take 1 capsule (500 mg total) by mouth 4 (four) times daily for 10 days. 06/29/23 07/09/23 Yes [name], MD  famotidine (PEPCID) 20 MG tablet Take 1 tablet (20 mg total) by mouth 2 (two) times daily. 06/29/23  Yes [name], MD  permethrin (ELIMITE) 5 % cream Apply to affected area once 06/29/23  Yes [name], MD  triamcinolone cream (KENALOG) 0.1 % Apply 1 Application topically 2 (two) times daily. 06/29/23  Yes [name], MD  diclofenac Sodium (VOLTAREN) 1 % GEL Apply 2 g topically 4 (four) times daily. 05/15/23   [provider]  diltiazem (CARDIZEM CD) 120 MG 24 hr capsule Take 120 mg by mouth daily. 11/11/21   [provider]  Ferrous Sulfate (IRON PO) Take by mouth 3 (three) times a week.  [provider]  metroNIDAZOLE (METROGEL) 0.75 % vaginal gel Use 1 applicator in vagina if having odor in discharge 06/01/23   [name], NP      Allergies    Latex    Review of Systems   Review of Systems A 10 point review of systems was performed and is negative unless otherwise reported in HPI.  Physical Exam Updated Vital Signs BP 113/61   Pulse 88   Temp (!) 97.5 F (36.4 C)   Resp 16   Ht 5\' 5"  (1.651 m)   Wt 81.6 kg   LMP 05/12/2023   SpO2 100%   BMI 29.95 kg/m  Physical Exam General: Normal appearing female, lying in bed.  HEENT: Sclera anicteric, MMM, trachea midline. Clear oropharynx. Cardiology: RRR, no murmurs/rubs/gallops. BL radial and DP pulses equal bilaterally.  Resp: Normal respiratory rate  and effort. CTAB, no wheezes, rhonchi, crackles.  Abd: Soft, non-tender, non-distended. No rebound tenderness or guarding.  GU: Deferred. MSK: No peripheral edema or signs of trauma. Extremities without deformity or TTP. No cyanosis or clubbing. Skin: warm, dry. Multiple scabs, marks and excoriations on lower extremities. Area of blanching erythema that is warm to the touch over large area on anterior bilateral lower extremities and abdomen. Smaller area of erythema on L breast (examined with RN chaperone) without nipple discharge, fluctuance, wounds, peau d'orange, or other skin changes.  No petechia or purpura noted on the skin.  No other areas of fluctuance or abscesses.  No purulent draining wounds.  No insects identified. Dry-appearing erythematous area over the bridge of her nose as well.  Neuro: A&Ox4, CNs II-XII grossly intact. MAEs. Sensation grossly intact.  Psych: Extremely anxious, tearful affect. Catastrophizing.  Mildly disheveled.           ED Results / Procedures / Treatments   Labs (all labs ordered are listed, but only abnormal results are displayed) Labs Reviewed - No data to display  EKG None  Radiology No results found.  Procedures Procedures    Medications Ordered in ED Medications  cephALEXin (KEFLEX) capsule 500 mg (500 mg Oral Given 06/29/23 0924)  famotidine (PEPCID) tablet 20 mg (20 mg Oral Given 06/29/23 0924)  predniSONE (DELTASONE) tablet 40 mg (40 mg Oral Given 06/29/23 4132)    ED Course/ Medical Decision Making/ A&P                          Medical Decision Making Risk Prescription drug management.   MDM:    For patient's complaints, consider cellulitis/erysipelas, contact or allergic dermatitis, drug rash, scabies/fleas/insect bites.  This is not an exhaustive differential. Pt is very well-appearing.  Possible that patient has superimposed cellulitis or erysipelas due to infection from multiple excoriations. She has been taking  doxycycline 2 days without improvement. Per chart review she has been given doxycycline in the past and has tolerated well, she also states that the redness started before the doxycyline was prescribed and that was actually why the doxy was prescribed; but also must consider drug rash or allergic reaction/hives or drug rash d/t doxycycline. She states she has been taking benadryl with no relief.  I have very low concern for acute life-threatening cause of her skin eruption including anaphylaxis, TSS, SJS/TN, SSSS, pemphigus vulgaris or bullous pemphigoid, DRESS syndrome.  Patient requests labs to check for bugs in her skin. I inform patient that there are no blood tests to check for that type of bug, and she  requests labs anyway. Patient is very well-appearing, afebrile, non-toxic appearing. I have very low concern for SIRS/sepsis, and I do not believe that labs will change management for this patient. No c/f vascultiis, and there are no petechiae/purpura that would indicate thrombocytopenia. If she has elevated white count would not change management.   For breast, it appears to be a continuation of the skin eruption over the rest of her body. As for her concerns about breast cancer I advised patient to continue to follow with her OB/Gyn and have a mammogram as she was previously instructed. She has no palpable masses or lumps on the breast that I can identify, or skin changes to suggest cancer.   She appears to have nonpurulent cellulitis and has been on doxycycline monotherapy. Will have her discontinue the doxycycline and add keflex 500 mg q6h x 5 days. Also consider allergic reaction and I gave her a dose of prednisone here, as well as pepcid and keflex, and will DC w/  continuing OTC benadryl, add famotidine, and add topical steroid triamcinolone cream as well. Instructed her to not use benadryl and atarax together. Will have patient follow up with her PCP and call to reschedule an appointment with the  dermatologist for within the next 1-2 weeks.  Patient is extremely tearful/anxious throughout the interview/exam. She is perseverating on cancer and bugs. 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 She states she does not see a counselor/therapist. I encouraged patient highly to make an appointment with a therapist to help manage her anxiety about her health and other concerns.  She is given resources in her discharge paperwork to call and make an appointment.      Additional history obtained from chart review.  Social Determinants of Health: Lives independently  Disposition: DC w/ discharge instructions/return precautions. All questions answered to patient's satisfaction.    Co morbidities that complicate the patient evaluation  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)    Endometrial polyp 09/01/2019   Pt aware, make appt with Dr Emelda Fear,    Fibroids 09/01/2019   Hypertension    Liver disease    Ovarian cyst    cervical polyp   Palpitations    possible bradycardia   Somatization disorder      Medicines Meds ordered this encounter  Medications   cephALEXin (KEFLEX) capsule 500 mg   famotidine (PEPCID) tablet 20 mg   predniSONE (DELTASONE) tablet 40 mg   cephALEXin (KEFLEX) 500 MG capsule    Sig: Take 1 capsule (500 mg total) by mouth 4 (four) times daily for 10 days.    Dispense:  40 capsule    Refill:  0   famotidine (PEPCID) 20 MG tablet    Sig: Take 1 tablet (20 mg total) by mouth 2 (two) times daily.    Dispense:  30 tablet    Refill:  0   triamcinolone cream (KENALOG) 0.1 %    Sig: Apply 1 Application topically 2 (two) times daily.    Dispense:  30 g    Refill:  0   permethrin (ELIMITE) 5 % cream    Sig: Apply to affected area once    Dispense:  60 g     Refill:  0    I have reviewed the patients home medicines and have  made adjustments as needed  Problem List / ED Course: Problem List Items Addressed This Visit   None Visit Diagnoses     Skin eruption    -  Primary   Anxiety about health       Excoriation                       This note was created using dictation software, which may contain spelling or grammatical errors.    [name], MD 06/29/23 [phone]

## 2023-06-29 NOTE — Discharge Instructions (Addendum)
Thank you for coming to [facility] Emergency Department. You were seen for redness of the skin. We did an exam, and these showed a skin eruption that could represent infection or allergic reaction. Please stop taking the doxycycline and instead take keflex 500 mg every 6 hours for 10 days. You can use topical steroid triamcinolone cream twice per day on areas that are itchy. After the redness has subsided, you can apply permethrin cream once from the neck down, leave on 8-12 hours before washing it off, to treat for possible scabies.   Please do not take benadryl and atarax together. You can take benadryl every 8 hours for itching and rash. You can also take pepcid 20 mg twice per day for possible allergic reaction as well   As previously instructed, please evaluate your home for bed bugs or fleas and wash all clothes and bedding. Please have the home inspected by an insect service or have the home fumigated.   Please follow up with your primary care provider within 1 week. Please call to reschedule with your dermatologist for within the next 1-2 weeks.   Anxiety can be debilitating. Attached are resources for mental health and outpatient counseling. Please call to schedule an appointment with a therapist. It can only help!  Do not hesitate to return to the ED or call 911 if you experience: -Worsening symptoms -Lightheadedness, passing out -Fevers/chills -Anything else that concerns you

## 2023-06-29 NOTE — ED Triage Notes (Signed)
Arrives [facility] from home with a recently diagnosed bacterial infection of the skin. Started Doxycycline <2 days ago and has not seen improvement.   Says she thinks she has scabies and does not  want to become septic.

## 2023-07-08 ENCOUNTER — Emergency Department (HOSPITAL_COMMUNITY)
Admission: EM | Admit: 2023-07-08 | Discharge: 2023-07-10 | Disposition: A | Discharge status: Other Acute Inpatient Hospital | Payer: MEDICAID | Attending: Emergency Medicine | Admitting: Emergency Medicine

## 2023-07-08 ENCOUNTER — Encounter (HOSPITAL_COMMUNITY): Payer: Self-pay | Admitting: Emergency Medicine

## 2023-07-08 ENCOUNTER — Other Ambulatory Visit: Payer: Self-pay

## 2023-07-08 DIAGNOSIS — F312 Bipolar disorder, current episode manic severe with psychotic features: Secondary | ICD-10-CM | POA: Diagnosis present

## 2023-07-08 DIAGNOSIS — I10 Essential (primary) hypertension: Secondary | ICD-10-CM | POA: Insufficient documentation

## 2023-07-08 DIAGNOSIS — Z20822 Contact with and (suspected) exposure to covid-19: Secondary | ICD-10-CM | POA: Diagnosis not present

## 2023-07-08 DIAGNOSIS — F23 Brief psychotic disorder: Secondary | ICD-10-CM | POA: Diagnosis not present

## 2023-07-08 DIAGNOSIS — J349 Unspecified disorder of nose and nasal sinuses: Secondary | ICD-10-CM | POA: Diagnosis not present

## 2023-07-08 DIAGNOSIS — Z91040 Latex allergy status: Secondary | ICD-10-CM | POA: Insufficient documentation

## 2023-07-08 DIAGNOSIS — R21 Rash and other nonspecific skin eruption: Secondary | ICD-10-CM | POA: Diagnosis not present

## 2023-07-08 DIAGNOSIS — F419 Anxiety disorder, unspecified: Secondary | ICD-10-CM | POA: Insufficient documentation

## 2023-07-08 DIAGNOSIS — Z79899 Other long term (current) drug therapy: Secondary | ICD-10-CM | POA: Insufficient documentation

## 2023-07-08 DIAGNOSIS — F319 Bipolar disorder, unspecified: Secondary | ICD-10-CM

## 2023-07-08 LAB — COMPREHENSIVE METABOLIC PANEL
ALT: 21 U/L (ref 0–44)
AST: 18 U/L (ref 15–41)
Albumin: 3.8 g/dL (ref 3.5–5.0)
Alkaline Phosphatase: 69 U/L (ref 38–126)
Anion gap: 10 (ref 5–15)
BUN: 17 mg/dL (ref 6–20)
CO2: 26 mmol/L (ref 22–32)
Calcium: 8.7 mg/dL — ABNORMAL LOW (ref 8.9–10.3)
Chloride: 100 mmol/L (ref 98–111)
Creatinine, Ser: 0.71 mg/dL (ref 0.44–1.00)
GFR, Estimated: >60 mL/min (ref >60)
Glucose, Bld: 100 mg/dL — ABNORMAL HIGH (ref 70–99)
Potassium: 3.7 mmol/L (ref 3.5–5.1)
Sodium: 136 mmol/L (ref 135–145)
Total Bilirubin: 0.5 mg/dL (ref 0.3–1.2)
Total Protein: 7.1 g/dL (ref 6.5–8.1)

## 2023-07-08 LAB — CBC
HCT: 35.8 % — ABNORMAL LOW (ref 36.0–46.0)
Hemoglobin: 11.3 g/dL — ABNORMAL LOW (ref 12.0–15.0)
MCH: 25.5 pg — ABNORMAL LOW (ref 26.0–34.0)
MCHC: 31.6 g/dL (ref 30.0–36.0)
MCV: 80.8 fL (ref 80.0–100.0)
Platelets: 312 10*3/uL (ref 150–400)
RBC: 4.43 MIL/uL (ref 3.87–5.11)
RDW: 17.2 % — ABNORMAL HIGH (ref 11.5–15.5)
WBC: 7.3 10*3/uL (ref 4.0–10.5)
nRBC: 0 % (ref 0.0–0.2)

## 2023-07-08 LAB — RAPID URINE DRUG SCREEN, HOSP PERFORMED
Amphetamines: NOT DETECTED
Barbiturates: NOT DETECTED
Benzodiazepines: NOT DETECTED
Cocaine: NOT DETECTED
Opiates: NOT DETECTED
Tetrahydrocannabinol: NOT DETECTED

## 2023-07-08 LAB — SALICYLATE LEVEL: Salicylate Lvl: <7 mg/dL — ABNORMAL LOW (ref 7.0–30.0)

## 2023-07-08 LAB — ETHANOL: Alcohol, Ethyl (B): <10 mg/dL (ref <10)

## 2023-07-08 LAB — ACETAMINOPHEN LEVEL: Acetaminophen (Tylenol), Serum: <10 ug/mL — ABNORMAL LOW (ref 10–30)

## 2023-07-08 MED ORDER — DOXYCYCLINE HYCLATE 100 MG PO TABS
100.0000 mg | ORAL_TABLET | Freq: Once | ORAL | Status: AC
  Administered 2023-07-08: 100 mg via ORAL
  Filled 2023-07-08: qty 1

## 2023-07-08 NOTE — ED Provider Notes (Signed)
[place] EMERGENCY DEPARTMENT AT [facility] Provider Note   CSN: [id number] Arrival date & time: 07/08/23  1350     History  No chief complaint on file.   Kayla [name] is a 52 y.o. female.  Medical history of bipolar disorder, delusional disorder, presents to the ER on IVC today taken out by her son Kayla [name].  He reports that patient has not been taking her bipolar medication, he has that she has been declining over the recent weeks similar to she had an episode many years ago.  States she has been hoarding and not cleaning the house which creates a dangerous situation as his elderly grandmother who his mother takes care of resides in the house.  To address this and talk to his mother about getting the house cleaned in better shape and offered to help, she was accusing him of putting rat poison out for her, he states that she got very angry and stated "You're to make me kill you and Fraser Din and Salley".  Patient states only wants her money and to get her out of the house so he can take it.  She denies SI or HI.  Patient's only physical complaint is that she has a rash that been ongoing for weeks, she is seen in the ED in [facility] and started on antibiotics, seen in this ED subsequently, given additional antibiotics and permethrin.  She is compliant with these, states the redness is getting better but she is now having swelling of her nose and is worried this is not going to go away, still having some rash as well.  She states she has an upcoming appointment at [facility] to see a dermatologist for further evaluation.  No fevers or chills, no rash to palms or soles, no numbness ting or weakness.  No other complaints.     HPI     Home Medications Prior to Admission medications   Medication Sig Start Date End Date Taking? Authorizing Provider  cephALEXin (KEFLEX) 500 MG capsule Take 1 capsule (500 mg total) by mouth 4 (four) times daily for 10 days.  06/29/23 07/09/23  [name], MD  diclofenac Sodium (VOLTAREN) 1 % GEL Apply 2 g topically 4 (four) times daily. 05/15/23   [provider]  diltiazem (CARDIZEM CD) 120 MG 24 hr capsule Take 120 mg by mouth daily. 11/11/21   [provider]  famotidine (PEPCID) 20 MG tablet Take 1 tablet (20 mg total) by mouth 2 (two) times daily. 06/29/23   [name], MD  Ferrous Sulfate (IRON PO) Take by mouth 3 (three) times a week.    [provider]  metroNIDAZOLE (METROGEL) 0.75 % vaginal gel Use 1 applicator in vagina if having odor in discharge 06/01/23   [name], NP  permethrin (ELIMITE) 5 % cream Apply to affected area once 06/29/23   [name], MD  triamcinolone cream (KENALOG) 0.1 % Apply 1 Application topically 2 (two) times daily. 06/29/23   [name], MD      Allergies    Latex    Review of Systems   Review of Systems  Physical Exam Updated Vital Signs BP (!) 167/110 (BP Location: Right Arm)   Pulse (!) 124   Temp 99.4 F (37.4 C) (Oral)   Resp 18   Ht 5\' 5"  (1.651 m)   Wt 81.6 kg   LMP  (Approximate)   SpO2 97%   BMI 29.94 kg/m  Physical Exam Vitals and nursing note reviewed.  Constitutional:      General: She is not in acute distress.    Appearance: She is well-developed.  HENT:     Head: Normocephalic and atraumatic.     Mouth/Throat:     Mouth: Mucous membranes are moist.  Eyes:     Conjunctiva/sclera: Conjunctivae normal.  Cardiovascular:     Rate and Rhythm: Normal rate and regular rhythm.     Heart sounds: No murmur heard. Pulmonary:     Effort: Pulmonary effort is normal. No respiratory distress.     Breath sounds: Normal breath sounds.  Abdominal:     Palpations: Abdomen is soft.     Tenderness: There is no abdominal tenderness.  Musculoskeletal:        General: No swelling. Normal range of motion.     Cervical back: Neck supple.  Skin:    General: Skin is warm and dry.     Capillary Refill: Capillary  refill takes less than 2 seconds.     Comments: Rash noted to lower abdomen and left breast, no drainage, mildly papular, no erythema suggesting cellulitis.  Few excoriated areas to the bilateral lower legs noted.  No rash noted to the face, no mucosal lesions in the mouth, no palm or sole lesions  Neurological:     General: No focal deficit present.     Mental Status: She is alert and oriented to person, place, and time.  Psychiatric:        Mood and Affect: Mood is anxious.        Speech: Speech is rapid and pressured.        Behavior: Behavior is cooperative.        Thought Content: Thought content is paranoid. Thought content does not include homicidal or suicidal ideation. Thought content does not include homicidal or suicidal plan.     ED Results / Procedures / Treatments   Labs (all labs ordered are listed, but only abnormal results are displayed) Labs Reviewed  COMPREHENSIVE METABOLIC PANEL  ETHANOL  SALICYLATE LEVEL  ACETAMINOPHEN LEVEL  CBC  RAPID URINE DRUG SCREEN, HOSP PERFORMED  POC URINE PREG, ED    EKG None  Radiology No results found.  Procedures Procedures    Medications Ordered in ED Medications - No data to display  ED Course/ Medical Decision Making/ A&P                                 Medical Decision Making DDx: Psychosis, bipolar disorder, mania, paranoia, other  Course: Patient presents with pressured speech, reportedly threatening family members per her son who took out paperwork, not taking any medications for her bipolar disorder, patient states she does not take any medications for this.  She has reassuring vitals and labs, still awaiting urinalysis and urine drug screen but patient at this time medically clear.  Son reports she had hospitalization for similar many years ago.  Patient has been calm and cooperative.  Awaiting TTS consult  Amount and/or Complexity of Data Reviewed Labs: ordered.  Risk Prescription drug  management.           Final Clinical Impression(s) / ED Diagnoses Final diagnoses:  None    Rx / DC Orders ED Discharge Orders     None         Ma Rings, PA-C 07/08/23 2112    Tanda Rockers A, DO 07/09/23 [phone]

## 2023-07-08 NOTE — ED Notes (Signed)
Pt changed into BH scrubs. Wiped down with CHG wipes due to sores all over body.
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
## 2023-07-08 NOTE — BH Assessment (Addendum)
Clinician made referral to [facility] for a teleassessment.  Lauren with [facility] said that they will contact back regarding a time for their teleassessment.  Clinician informed RN Kayla via secure messaging.

## 2023-07-08 NOTE — ED Notes (Signed)
Pt requested dose of doxycycline because she is taking it at home. Pt takes 100 mg q12h. EDP notified.

## 2023-07-09 ENCOUNTER — Telehealth: Payer: Self-pay | Admitting: Adult Health

## 2023-07-09 MED ORDER — OLANZAPINE 10 MG IM SOLR: 10.0000 mg | Freq: Once | INTRAMUSCULAR | Status: DC | PRN

## 2023-07-09 MED ORDER — DILTIAZEM HCL ER COATED BEADS 120 MG PO CP24
120.0000 mg | ORAL_CAPSULE | Freq: Every day | ORAL | Status: DC
  Administered 2023-07-09: 120 mg via ORAL
  Filled 2023-07-09: qty 1

## 2023-07-09 MED ORDER — DIPHENHYDRAMINE HCL 25 MG PO CAPS: 25.0000 mg | ORAL_CAPSULE | Freq: Once | ORAL | Status: DC

## 2023-07-09 MED ORDER — DILTIAZEM HCL ER COATED BEADS 180 MG PO CP24: 180.0000 mg | ORAL_CAPSULE | Freq: Every day | ORAL | Status: DC

## 2023-07-09 MED ORDER — QUETIAPINE FUMARATE 25 MG PO TABS
50.0000 mg | ORAL_TABLET | Freq: Every day | ORAL | Status: DC
  Administered 2023-07-09 (×2): 50 mg via ORAL
  Filled 2023-07-09 (×2): qty 2

## 2023-07-09 MED ORDER — HYDROXYZINE HCL 25 MG PO TABS
25.0000 mg | ORAL_TABLET | Freq: Three times a day (TID) | ORAL | Status: DC | PRN
  Administered 2023-07-09 (×2): 25 mg via ORAL
  Filled 2023-07-09 (×2): qty 1

## 2023-07-09 NOTE — ED Notes (Signed)
Patient allowed phone call for 5 mins
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
## 2023-07-09 NOTE — ED Notes (Signed)
Mother called for an update and is now speaking to the pt.

## 2023-07-09 NOTE — ED Notes (Addendum)
Patient is scratching at thighs and requesting to speak with MD. MD aware and benadryl ordered
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
## 2023-07-09 NOTE — ED Notes (Signed)
Patient provided phone for use.  Patient is aware that this is the last phone call for the day.

## 2023-07-09 NOTE — Telephone Encounter (Signed)
Will try to return call to daughter Marcelino Duster, pt is in ER. Phone rang then cut off. # was [phone]

## 2023-07-09 NOTE — ED Notes (Signed)
Patient has 3 visitors. Explained they may visit one at a time.

## 2023-07-09 NOTE — ED Notes (Addendum)
Pt has been accepted to [facility] TOMORROW 07/10/2023.  Bed assignment: Drue Dun  Pt meets inpatient criteria per Sindy Guadeloupe, NP  Attending Physician will be Theophilus Bones, MD  Report can be called to: [phone]  Pt can arrive after 7 AM

## 2023-07-09 NOTE — Progress Notes (Signed)
LCSW Progress Note  [id number]   Kayla [name]  07/09/2023  3:01 PM  Description:   Inpatient Psychiatric Referral  Patient was recommended inpatient per Tyler Aas, NP. There are no available beds at [facility], per [facility], RN. Patient was referred to the following out of network facilities:   Destination  Service Provider Address Phone Fax  [facility]  [address] [place] [address] [phone]  [facility]  [address] [place] [address] [phone]  [facility]  [address] [place] [address] [phone]  [facility]  [address] [place], [place] [address] [phone]  [facility]  [address] [place], [place] [address] [phone]  [facility]  [address] [place] [address] [phone]  [facility]  [address] [place], [place] [address] [phone]  [facility]  [address] [place] [address] [phone]  [facility]  [address] [place] [address] [phone]  [facility]  [address], [place] [address] [phone]  [facility]  [address] [place] [address] [phone]  [facility]  [address], [place] [address] [phone]  [facility]  [address] [place] [address] [phone]  [facility]  [place] [phone]  [facility]  [address] [place], [place] [phone]  [facility]  [address], [place] [address]  [phone]  [facility]  [address], [place] [address] [phone]  [facility]  [address] [place], [place] [address] [phone]  [facility]  [address] [place] [address] [phone]  [facility] EFAX  [place] [phone]    Situation ongoing, CSW to continue following and update chart as more information becomes available.      Cathie Beams, [place]  07/09/2023 3:01 PM

## 2023-07-09 NOTE — Progress Notes (Signed)
Pt meets inpatient per Assunta Gambles, NP-Telepsychiatry Consult Services. This CSW has requested [facility] Brook McNichol, RN to review.   Maryjean Ka, MSW, LCSWA 07/09/2023 2:23 AM

## 2023-07-09 NOTE — Progress Notes (Signed)
Pt has been accepted to [facility] TOMORROW 07/10/2023. Bed assignment: Drue Dun  Pt meets inpatient criteria per Sindy Guadeloupe, NP  Attending Physician will be Theophilus Bones, MD  Report can be called to: [phone]  Pt can arrive after 7 AM  Care Team Notified: Duwaine Maxin, RN  Cathie Beams, [place]  07/09/2023 3:28 PM

## 2023-07-09 NOTE — ED Notes (Signed)
Updates provided to patient and family members

## 2023-07-09 NOTE — ED Notes (Signed)
Pt wanted a doxycycline for her rash. Nurse advised pt that it was d/c'd. There is a rash to her legs and abd area. EDP will be advised.

## 2023-07-10 LAB — RESP PANEL BY RT-PCR (RSV, FLU A&B, COVID)  RVPGX2
Influenza A by PCR: NEGATIVE
Influenza B by PCR: NEGATIVE
Resp Syncytial Virus by PCR: NEGATIVE
SARS Coronavirus 2 by RT PCR: NEGATIVE
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
## 2023-08-17 DIAGNOSIS — Z862 Personal history of diseases of the blood and blood-forming organs and certain disorders involving the immune mechanism: Secondary | ICD-10-CM | POA: Insufficient documentation

## 2024-02-25 ENCOUNTER — Ambulatory Visit: Payer: MEDICAID | Admitting: Adult Health

## 2024-04-06 ENCOUNTER — Encounter: Payer: Self-pay | Admitting: Urology

## 2024-04-06 NOTE — Telephone Encounter (Signed)
 FYI and advise please

## 2024-04-09 ENCOUNTER — Encounter: Payer: Self-pay | Admitting: Obstetrics & Gynecology

## 2024-04-10 ENCOUNTER — Encounter: Payer: Self-pay | Admitting: Urology

## 2024-04-11 ENCOUNTER — Encounter: Payer: Self-pay | Admitting: Urology

## 2024-04-11 NOTE — Telephone Encounter (Signed)
 FYI

## 2024-04-11 NOTE — Telephone Encounter (Signed)
 FYI and advise please pt scheduled for cysto 08/18

## 2024-04-11 NOTE — Telephone Encounter (Signed)
 Possible duplicate

## 2024-04-11 NOTE — Telephone Encounter (Signed)
 Fyi and advise

## 2024-04-11 NOTE — Telephone Encounter (Signed)
 Not sure if I sent this one my apologies

## 2024-04-11 NOTE — Telephone Encounter (Signed)
 FYI and advise

## 2024-04-12 ENCOUNTER — Ambulatory Visit (INDEPENDENT_AMBULATORY_CARE_PROVIDER_SITE_OTHER): Payer: MEDICAID | Admitting: Urology

## 2024-04-12 ENCOUNTER — Encounter: Payer: Self-pay | Admitting: Urology

## 2024-04-12 ENCOUNTER — Ambulatory Visit (INDEPENDENT_AMBULATORY_CARE_PROVIDER_SITE_OTHER): Payer: MEDICAID | Admitting: Adult Health

## 2024-04-12 ENCOUNTER — Encounter: Payer: Self-pay | Admitting: Adult Health

## 2024-04-12 VITALS — BP 134/82 | HR 78 | Ht 64.0 in | Wt 199.0 lb

## 2024-04-12 VITALS — BP 131/82 | HR 97

## 2024-04-12 DIAGNOSIS — R102 Pelvic and perineal pain: Secondary | ICD-10-CM | POA: Diagnosis not present

## 2024-04-12 DIAGNOSIS — N926 Irregular menstruation, unspecified: Secondary | ICD-10-CM

## 2024-04-12 DIAGNOSIS — N281 Cyst of kidney, acquired: Secondary | ICD-10-CM

## 2024-04-12 DIAGNOSIS — R310 Gross hematuria: Secondary | ICD-10-CM

## 2024-04-12 DIAGNOSIS — Z131 Encounter for screening for diabetes mellitus: Secondary | ICD-10-CM | POA: Diagnosis not present
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
## 2024-04-12 NOTE — Progress Notes (Signed)
  Subjective:     Patient ID: Kayla [name], female   DOB: 12/31/1970, 53 y.o.   MRN: [id number]  HPI Kayla [name] is a 53 year old white female, divorced, [id number], in complaining of irregular bleeding, will bleed then stop, then bleed again. She says has pelvic pain and feels like insides falling down. She is worried about her pap being ASCUS, negative HPV too(tried to reassure ). She uses [employer] and googles everything, even labs done in ER in June. Was treated for UTI recently.      Component Value Date/Time   DIAGPAP (A) 05/05/2023 0922    - Atypical squamous cells of undetermined significance (ASC-US )   DIAGPAP  12/25/2020 1113    - Negative for Intraepithelial Lesions or Malignancy (NILM)   DIAGPAP - Benign reactive/reparative changes 12/25/2020 1113   HPVHIGH Negative 05/05/2023 0922   HPVHIGH Negative 12/25/2020 1113   HPVHIGH Negative 10/27/2019 1002   ADEQPAP  05/05/2023 0922    Satisfactory for evaluation; transformation zone component PRESENT.   ADEQPAP  12/25/2020 1113    Satisfactory for evaluation; transformation zone component PRESENT.   ADEQPAP  10/27/2019 1002    Satisfactory for evaluation; transformation zone component PRESENT.    PCP is Dr Orpha  Review of Systems +irregular bleeding, will bleed then stop, then bleed again.  +pelvic pain and feels like insides falling down.    Denies being sexually active Reviewed past medical,surgical, social and family history. Reviewed medications and allergies.  Objective:   Physical Exam BP 134/82 (BP Location: Right Arm, Patient Position: Sitting, Cuff Size: Normal)   Pulse 78   Ht 5' 4 (1.626 m)   Wt 199 lb (90.3 kg)   BMI 34.16 kg/m   Skin warm and dry.Pelvic: external genitalia is normal in appearance no lesions, vagina: scant brown blood, no odor,urethra has no lesions or masses noted, cervix:smooth and bulbous,no CMT, uterus: normal size, shape and contour, mildly tender, no masses felt, adnexa: no masses or  tenderness noted. Bladder is non tender and no masses felt.    Fall risk is low  Upstream - 04/12/24 0948       Pregnancy Intention Screening   Does the patient want to become pregnant in the next year? No    Does the patient's partner want to become pregnant in the next year? No    Would the patient like to discuss contraceptive options today? No      Contraception Wrap Up   Current Method Abstinence    End Method Abstinence    Contraception Counseling Provided No         Examination chaperoned by Clarita Salt LPN Assessment:     1. Irregular bleeding (Primary) +irregular bleeding, will bleed then stop, then bleed again Will check labs and get US  at [facility] to assess uterus, US  is 04/21/24 at 10:30 am  - CBC - Follicle stimulating hormone - TSH + free T4 - US  PELVIC COMPLETE WITH TRANSVAGINAL; Future  2. Pelvic pain +pelvic pain, feels like insides falling down Will check labs and get US  04/21/24 at 10:30 am at [facility] to assess uterus and ovaries - CBC - Comprehensive metabolic panel with GFR - US  PELVIC COMPLETE WITH TRANSVAGINAL; Future  3. Screening for diabetes mellitus Screening at her request  - Hemoglobin A1c     Plan:     Return 05/12/24 for pap only

## 2024-04-12 NOTE — Telephone Encounter (Signed)
 She has an appt scheduled w/ Sarah for today !

## 2024-04-13 ENCOUNTER — Other Ambulatory Visit: Payer: Self-pay

## 2024-04-13 ENCOUNTER — Ambulatory Visit: Payer: Self-pay | Admitting: Adult Health

## 2024-04-13 ENCOUNTER — Emergency Department (HOSPITAL_COMMUNITY)
Admission: EM | Admit: 2024-04-13 | Discharge: 2024-04-13 | Disposition: A | Discharge status: Home / Self Care | Payer: MEDICAID | Source: Ambulatory Visit

## 2024-04-13 ENCOUNTER — Emergency Department (HOSPITAL_COMMUNITY): Payer: MEDICAID

## 2024-04-13 DIAGNOSIS — Z91040 Latex allergy status: Secondary | ICD-10-CM | POA: Insufficient documentation

## 2024-04-13 DIAGNOSIS — H538 Other visual disturbances: Secondary | ICD-10-CM | POA: Insufficient documentation

## 2024-04-13 DIAGNOSIS — N3001 Acute cystitis with hematuria: Secondary | ICD-10-CM | POA: Diagnosis not present

## 2024-04-13 DIAGNOSIS — R208 Other disturbances of skin sensation: Secondary | ICD-10-CM | POA: Diagnosis present

## 2024-04-13 LAB — CBC WITH DIFFERENTIAL/PLATELET
Abs Immature Granulocytes: 0.02 10*3/uL (ref 0.00–0.07)
Basophils Absolute: 0 10*3/uL (ref 0.0–0.1)
Basophils Relative: 0 %
Eosinophils Absolute: 0 10*3/uL (ref 0.0–0.5)
Eosinophils Relative: 0 %
HCT: 33.3 % — ABNORMAL LOW (ref 36.0–46.0)
Hemoglobin: 10.6 g/dL — ABNORMAL LOW (ref 12.0–15.0)
Immature Granulocytes: 0 %
Lymphocytes Relative: 11 %
Lymphs Abs: 0.7 10*3/uL (ref 0.7–4.0)
MCH: 26.2 pg (ref 26.0–34.0)
MCHC: 31.8 g/dL (ref 30.0–36.0)
MCV: 82.4 fL (ref 80.0–100.0)
Monocytes Absolute: 0.4 10*3/uL (ref 0.1–1.0)
Monocytes Relative: 6 %
Neutro Abs: 4.9 10*3/uL (ref 1.7–7.7)
Neutrophils Relative %: 83 %
Platelets: 277 10*3/uL (ref 150–400)
RBC: 4.04 MIL/uL (ref 3.87–5.11)
RDW: 15.8 % — ABNORMAL HIGH (ref 11.5–15.5)
WBC: 6 10*3/uL (ref 4.0–10.5)
nRBC: 0 % (ref 0.0–0.2)

## 2024-04-13 LAB — CBC
Hematocrit: 35.3 % (ref 34.0–46.6)
Hemoglobin: 11 g/dL — ABNORMAL LOW (ref 11.1–15.9)
MCH: 26 pg — ABNORMAL LOW (ref 26.6–33.0)
MCHC: 31.2 g/dL — ABNORMAL LOW (ref 31.5–35.7)
MCV: 84 fL (ref 79–97)
Platelets: 268 10*3/uL (ref 150–450)
RBC: 4.23 x10E6/uL (ref 3.77–5.28)
RDW: 15.6 % — ABNORMAL HIGH (ref 11.7–15.4)
WBC: 5.6 10*3/uL (ref 3.4–10.8)

## 2024-04-13 LAB — URINALYSIS, ROUTINE W REFLEX MICROSCOPIC
Bilirubin Urine: NEGATIVE
Bilirubin, UA: NEGATIVE
Glucose, UA: NEGATIVE
Glucose, UA: NEGATIVE mg/dL
Ketones, ur: 20 mg/dL — AB
Leukocytes,UA: NEGATIVE
Leukocytes,Ua: NEGATIVE
Nitrite, UA: NEGATIVE
Nitrite: NEGATIVE
Protein, ur: NEGATIVE mg/dL
Protein,UA: NEGATIVE
Specific Gravity, UA: <1.005 — ABNORMAL LOW (ref 1.005–1.030)
Specific Gravity, Urine: 1.013 (ref 1.005–1.030)
Urobilinogen, Ur: 0.2 mg/dL (ref 0.2–1.0)
pH, UA: 6 (ref 5.0–7.5)
pH: 5 (ref 5.0–8.0)

## 2024-04-13 LAB — BASIC METABOLIC PANEL WITH GFR
Anion gap: 11 (ref 5–15)
BUN: 8 mg/dL (ref 6–20)
CO2: 25 mmol/L (ref 22–32)
Calcium: 8.7 mg/dL — ABNORMAL LOW (ref 8.9–10.3)
Chloride: 103 mmol/L (ref 98–111)
Creatinine, Ser: 0.7 mg/dL (ref 0.44–1.00)
GFR, Estimated: >60 mL/min (ref >60)
Glucose, Bld: 106 mg/dL — ABNORMAL HIGH (ref 70–99)
Potassium: 3.7 mmol/L (ref 3.5–5.1)
Sodium: 139 mmol/L (ref 135–145)

## 2024-04-13 LAB — COMPREHENSIVE METABOLIC PANEL WITH GFR
ALT: 14 IU/L (ref 0–32)
AST: 15 IU/L (ref 0–40)
Albumin: 4.1 g/dL (ref 3.8–4.9)
Alkaline Phosphatase: 93 IU/L (ref 44–121)
BUN/Creatinine Ratio: 13 (ref 9–23)
BUN: 9 mg/dL (ref 6–24)
Bilirubin Total: <0.2 mg/dL (ref 0.0–1.2)
CO2: 20 mmol/L (ref 20–29)
Calcium: 9.1 mg/dL (ref 8.7–10.2)
Chloride: 102 mmol/L (ref 96–106)
Creatinine, Ser: 0.7 mg/dL (ref 0.57–1.00)
Globulin, Total: 2.9 g/dL (ref 1.5–4.5)
Glucose: 105 mg/dL — ABNORMAL HIGH (ref 70–99)
Potassium: 4.3 mmol/L (ref 3.5–5.2)
Sodium: 139 mmol/L (ref 134–144)
Total Protein: 7 g/dL (ref 6.0–8.5)
eGFR: 105 mL/min/{1.73_m2} (ref >59)

## 2024-04-13 LAB — HEMOGLOBIN A1C
Est. average glucose Bld gHb Est-mCnc: 111 mg/dL
Hgb A1c MFr Bld: 5.5 % (ref 4.8–5.6)

## 2024-04-13 LAB — MICROSCOPIC EXAMINATION: Bacteria, UA: NONE SEEN

## 2024-04-13 LAB — TSH+FREE T4
Free T4: 1.53 ng/dL (ref 0.82–1.77)
TSH: 2.01 u[IU]/mL (ref 0.450–4.500)

## 2024-04-13 LAB — SEDIMENTATION RATE: Sed Rate: 19 mm/h (ref 0–22)

## 2024-04-13 LAB — C-REACTIVE PROTEIN: CRP: 0.6 mg/dL (ref <1.0)

## 2024-04-13 LAB — FOLLICLE STIMULATING HORMONE: FSH: 32.8 m[IU]/mL

## 2024-04-13 MED ORDER — PROCHLORPERAZINE EDISYLATE 10 MG/2ML IJ SOLN
10.0000 mg | Freq: Once | INTRAMUSCULAR | Status: DC
  Filled 2024-04-13: qty 2

## 2024-04-13 MED ORDER — SODIUM CHLORIDE 0.9 % IV BOLUS
1000.0000 mL | Freq: Once | INTRAVENOUS | Status: AC
  Administered 2024-04-13: 1000 mL via INTRAVENOUS

## 2024-04-13 MED ORDER — FOSFOMYCIN TROMETHAMINE 3 G PO PACK
3.0000 g | PACK | Freq: Once | ORAL | Status: AC
  Administered 2024-04-13: 3 g via ORAL
  Filled 2024-04-13: qty 3

## 2024-04-13 MED ORDER — ACETAMINOPHEN 500 MG PO TABS: 1000.0000 mg | ORAL_TABLET | Freq: Once | ORAL | Status: DC

## 2024-04-13 MED ORDER — DIPHENHYDRAMINE HCL 50 MG/ML IJ SOLN
25.0000 mg | Freq: Once | INTRAMUSCULAR | Status: DC
  Filled 2024-04-13: qty 1

## 2024-04-13 MED ORDER — KETOROLAC TROMETHAMINE 15 MG/ML IJ SOLN
15.0000 mg | Freq: Once | INTRAMUSCULAR | Status: DC
  Filled 2024-04-13: qty 1

## 2024-04-13 NOTE — ED Notes (Signed)
 Facilities called to decrease the room temperature.

## 2024-04-13 NOTE — ED Notes (Signed)
 Pt requested that she be tested for Lupus because she has a rash on her face. Pt also reports that the magnesium  in her bottled water that she has been drinking is what is causing her eyes to be dilated.  Pt concerned that she has graves disease at this time.

## 2024-04-13 NOTE — ED Provider Notes (Signed)
 [place] [facility] Provider Note   CSN: [id number] Arrival date & time: 04/13/24  1154     Patient presents with: Eye Problem and Headache   Kayla [name] is a 53 y.o. female patient with history of delusional disorder, bipolar disorder, anxiety, somatizations disorder who presents the emergency department with a burning sensation rated 7 out of 10 in severity to the vertex of her head.  Pain has been persistent for last few days.  She also reports associated blurred vision to both of her eyes this has been present for 2 weeks.  She denies any focal weakness or numbness.  She denies any trouble walking or talking.  No facial droop.    Eye Problem Associated symptoms: headaches   Headache      Prior to Admission medications   Medication Sig Start Date End Date Taking? Authorizing Provider  diltiazem  (CARDIZEM  CD) 180 MG 24 hr capsule Take 180 mg by mouth daily. 11/11/21   [provider]  doxycycline  (VIBRAMYCIN ) 100 MG capsule Take 100 mg by mouth 2 (two) times daily. 07/02/23   [provider]  famotidine  (PEPCID ) 20 MG tablet Take 1 tablet (20 mg total) by mouth 2 (two) times daily. 06/29/23   Franklyn Sid SAILOR, MD  Ferrous Sulfate (IRON PO) Take by mouth 3 (three) times a week.    [provider]  hydrOXYzine  (ATARAX ) 25 MG tablet Take 25 mg by mouth every 8 (eight) hours as needed for itching or anxiety. 06/27/23   [provider]  levocetirizine (XYZAL ) 5 MG tablet Take 5 mg by mouth every evening.    [provider]  mupirocin  ointment (BACTROBAN ) 2 % Apply 1 Application topically 4 (four) times a week. 07/02/23   [provider]  nitrofurantoin , macrocrystal-monohydrate, (MACROBID ) 100 MG capsule Take 100 mg by mouth 2 (two) times daily. 04/04/24   [provider]  permethrin  (ELIMITE ) 5 % cream Apply to affected area once 06/29/23   Naasz, Hayley N, MD    Allergies: Latex     Review of Systems  Neurological:  Positive for headaches.  All other systems reviewed and are negative.   Updated Vital Signs BP (!) 120/52   Pulse 78   Temp 98.4 F (36.9 C) (Oral)   Resp 16   Ht 5' 5 (1.651 m)   Wt 90.1 kg   SpO2 100%   BMI 33.05 kg/m   Physical Exam Vitals and nursing note reviewed.  Constitutional:      General: She is not in acute distress.    Appearance: Normal appearance.  HENT:     Head: Normocephalic and atraumatic.  Eyes:     General:        Right eye: No discharge.        Left eye: No discharge.  Cardiovascular:     Comments: Regular rate and rhythm.  S1/S2 are distinct without any evidence of murmur, rubs, or gallops.  Radial pulses are 2+ bilaterally.  Dorsalis pedis pulses are 2+ bilaterally.  No evidence of pedal edema. Pulmonary:     Comments: Clear to auscultation bilaterally.  Normal effort.  No respiratory distress.  No evidence of wheezes, rales, or rhonchi heard throughout. Abdominal:     General: Abdomen is flat. Bowel sounds are normal. There is no distension.     Tenderness: There is no abdominal tenderness. There is no guarding or rebound.  Musculoskeletal:        General: Normal range  of motion.     Cervical back: Neck supple.  Skin:    General: Skin is warm and dry.     Findings: No rash.  Neurological:     General: No focal deficit present.     Mental Status: She is alert.     Comments: Cranial nerves II through XII are intact.  5/5 strength to the upper and lower extremities.  No dysmetria with finger-nose.  Normal sensation to the upper and lower extremities.  Speech is normal.  Alert and oriented x 3.  GCS 15.  Psychiatric:        Mood and Affect: Mood is anxious.        Behavior: Behavior normal.     (all labs ordered are listed, but only abnormal results are displayed) Labs Reviewed  CBC WITH DIFFERENTIAL/PLATELET - Abnormal; Notable for the following components:      Result Value   Hemoglobin 10.6 (*)     HCT 33.3 (*)    RDW 15.8 (*)    All other components within normal limits  BASIC METABOLIC PANEL WITH GFR - Abnormal; Notable for the following components:   Glucose, Bld 106 (*)    Calcium 8.7 (*)    All other components within normal limits  URINALYSIS, ROUTINE W REFLEX MICROSCOPIC - Abnormal; Notable for the following components:   Hgb urine dipstick MODERATE (*)    Ketones, ur 20 (*)    Bacteria, UA RARE (*)    All other components within normal limits  C-REACTIVE PROTEIN  SEDIMENTATION RATE  ANTINUCLEAR ANTIBODIES, IFA    EKG: None  Radiology: MR BRAIN WO CONTRAST Result Date: 04/13/2024 CLINICAL DATA:  Provided history: Headache, new onset. Blurred vision. Burning sensation at top of head. EXAM: MRI HEAD WITHOUT CONTRAST TECHNIQUE: Multiplanar, multiecho pulse sequences of the brain and surrounding structures were obtained without intravenous contrast. COMPARISON:  Head CT 12/27/2011. FINDINGS: Brain: No age-advanced or lobar predominant cerebral atrophy. Tiny nonspecific focus of T2 FLAIR hyperintense signal abnormality within the anterior right frontal lobe white matter. No cortical encephalomalacia is identified. There is no acute infarct. No evidence of an intracranial mass. No chronic intracranial blood products. No extra-axial fluid collection. No midline shift. Vascular: Maintained flow voids within the proximal large arterial vessels. Skull and upper cervical spine: No focal worrisome marrow lesion. Multilevel facet arthropathy within the cervical spine at the visible levels. Sinuses/Orbits: No mass or acute finding within the imaged orbits. No significant paranasal sinus disease. Other: 5 mm ovoid T2 hyperintense lesion within the deep lobe of the right parotid gland (series 6, image 23). This may reflect a cyst or primary parotid neoplasm. IMPRESSION: 1. Unremarkable non-contrast MRI appearance of the brain. No evidence of an acute intracranial abnormality. 2. 5 mm ovoid lesion  within the deep lobe of the right parotid gland, which may reflect a primary parotid neoplasm or a cyst. Electronically Signed   By: Rockey Childs D.O.   On: 04/13/2024 15:50     Procedures   Medications Ordered in the ED  ketorolac (TORADOL) 15 MG/ML injection 15 mg (15 mg Intravenous Patient Refused/Not Given 04/13/24 1301)  prochlorperazine (COMPAZINE) injection 10 mg (10 mg Intravenous Patient Refused/Not Given 04/13/24 1302)  diphenhydrAMINE  (BENADRYL ) injection 25 mg (25 mg Intravenous Patient Refused/Not Given 04/13/24 1302)  acetaminophen  (TYLENOL ) tablet 1,000 mg (has no administration in time range)  fosfomycin (MONUROL) packet 3 g (has no administration in time range)  sodium chloride  0.9 % bolus 1,000 mL (0 mLs  Intravenous Stopped 04/13/24 1622)    Clinical Course as of 04/13/24 1909  Wed Apr 13, 2024  1303 Patient currently declines all pain medication.  She did except the normal saline.  Will proceed with that and check in with on her little bit. [CF]  1448 Patient is pretty insistent on getting tested for lupus that she is very concerned that when she rubs her face her face turns red.  Will add on a CRP, ESR, and ANA study. [CF]    Clinical Course User Index [CF] Theotis Cameron HERO, PA-C    Medical Decision Making NEEMA BARREIRA is a 53 y.o. female patient who presents to the emergency department today for further evaluation of headache and blurred vision.  Blurred vision has been going on for quite some time.  Patient is very concerned that something is wrong.  Offered patient CT scan of the head which she declined and would like to proceed with an MRI because she is scared of the radiation.  Low suspicion for stroke at this time.  Will give her migraine cocktail and some fluids.  Patient declined pain medications.  MRI of the brain is normal.  Workup ultimately unrevealing.  Will discharge her home with primary care follow-up.  Strict turn precaution were discussed.  She is safe  for discharge.  Amount and/or Complexity of Data Reviewed Labs: ordered. Radiology: ordered.  Risk OTC drugs. Prescription drug management.     Final diagnoses:  Acute cystitis with hematuria  Blurred vision    ED Discharge Orders     None          Theotis Cameron HERO DEVONNA 04/13/24 1909    Simon Lavonia SAILOR, MD 04/14/24 [id number]

## 2024-04-13 NOTE — ED Triage Notes (Signed)
 Patient sent by UC for eval of blurry vision and headache. Patient reports she has burning sensation on the top of her head 7/10, blurry vision x 2 weeks. Patient is anxious in triage. Reports taking macrobid  x 2 weeks for UTI but not taken completely or correctly.

## 2024-04-13 NOTE — ED Notes (Signed)
 Pt requesting RN assess her eyes for dilation multiple times.Pupils are equal round reactive to light.

## 2024-04-13 NOTE — ED Notes (Signed)
 Patient reports to RN that she has leg pain in the back of her calf that started during MRI while her leg was positioned on a pillow for the scan. Patient reports that she is concerned for a blood clot since she had skin lesions removed from the area at her dermatologist office. No edema, erythema, or tenderness noted to area. Provider made aware.

## 2024-04-13 NOTE — Discharge Instructions (Signed)
 As we discussed, your workup today was negative.  MRI looked great.  I would like for you to follow-up with your primary care doctor for further evaluation.  You may return to the emergency department for any worsening symptoms.

## 2024-04-16 LAB — ANTINUCLEAR ANTIBODIES, IFA: ANA Ab, IFA: NEGATIVE

## 2024-04-18 NOTE — Telephone Encounter (Signed)
 Kayla [name], patient has sent multiple message requesting you review her visit to the ER on 07/02.  She wants you to review all of her testing and the ER notes.  Please advise if this is something you will do or if you advise patient to follow up with PCP.  I do not see any urologic concerns in messages.  Please review previous message and advise.

## 2024-04-19 ENCOUNTER — Ambulatory Visit (HOSPITAL_COMMUNITY): Admission: RE | Admit: 2024-04-19 | Payer: MEDICAID | Source: Ambulatory Visit

## 2024-04-20 ENCOUNTER — Telehealth (HOSPITAL_COMMUNITY): Payer: Self-pay

## 2024-04-20 NOTE — Telephone Encounter (Signed)
 Pt. Called and left a VM for a return call. Pt. Has questions concerning her MRI from her ED visit on 04/13/2024.  Attempted to contact pt per number she left on Vm [phone], phone rings busy.

## 2024-04-21 ENCOUNTER — Ambulatory Visit (HOSPITAL_COMMUNITY)
Admission: RE | Admit: 2024-04-21 | Discharge: 2024-04-21 | Disposition: A | Discharge status: Home / Self Care | Payer: MEDICAID | Source: Ambulatory Visit | Attending: Urology | Admitting: Urology

## 2024-04-21 ENCOUNTER — Ambulatory Visit (HOSPITAL_COMMUNITY)
Admission: RE | Admit: 2024-04-21 | Discharge: 2024-04-21 | Disposition: A | Discharge status: Home / Self Care | Payer: MEDICAID | Source: Ambulatory Visit | Attending: Adult Health | Admitting: Adult Health

## 2024-04-21 DIAGNOSIS — K119 Disease of salivary gland, unspecified: Secondary | ICD-10-CM | POA: Insufficient documentation

## 2024-04-21 DIAGNOSIS — R102 Pelvic and perineal pain: Secondary | ICD-10-CM | POA: Insufficient documentation

## 2024-04-21 DIAGNOSIS — R310 Gross hematuria: Secondary | ICD-10-CM

## 2024-04-21 DIAGNOSIS — N926 Irregular menstruation, unspecified: Secondary | ICD-10-CM | POA: Insufficient documentation

## 2024-04-21 DIAGNOSIS — N281 Cyst of kidney, acquired: Secondary | ICD-10-CM

## 2024-04-25 ENCOUNTER — Ambulatory Visit: Payer: Self-pay | Admitting: Urology

## 2024-04-25 NOTE — Progress Notes (Signed)
 Please let pt know there were no stones visualized on the RUS. Also no concerning masses or hydronephrosis. Has a small benign left renal cyst which does not explain / would not contribute to any symptoms at this time. Was noted to have fatty liver (hepatic steatosis) which she should address with her PCP and/or GI specialist. Advised to follow up for cystoscopy with Dr. Nieves on 05/30/2024 as scheduled.

## 2024-05-12 ENCOUNTER — Ambulatory Visit: Payer: MEDICAID | Admitting: Adult Health

## 2024-05-12 ENCOUNTER — Encounter: Payer: Self-pay | Admitting: Adult Health

## 2024-05-12 ENCOUNTER — Other Ambulatory Visit (HOSPITAL_COMMUNITY)
Admission: RE | Admit: 2024-05-12 | Discharge: 2024-05-12 | Disposition: A | Discharge status: Home / Self Care | Payer: MEDICAID | Source: Ambulatory Visit | Attending: Adult Health | Admitting: Adult Health

## 2024-05-12 VITALS — BP 131/87 | HR 88 | Ht 65.0 in | Wt 200.0 lb

## 2024-05-12 DIAGNOSIS — Z124 Encounter for screening for malignant neoplasm of cervix: Secondary | ICD-10-CM | POA: Diagnosis present

## 2024-05-12 NOTE — Progress Notes (Signed)
  Subjective:     Patient ID: Kayla [name], female   DOB: [birth year], 53 y.o.   MRN: [id number]  HPI Kayla [name] is a 53 year old white female, divorced, [id number], in for a pap smear that she requested, last pap was negative HPV ASCUS 05/05/23, and she is a Product/process development scientist.  PCP is Dr Orpha  Review of Systems Last period was only 5 days Not having sex Reviewed past medical,surgical, social and family history. Reviewed medications and allergies.     Objective:   Physical Exam BP 131/87 (BP Location: Left Arm, Patient Position: Sitting, Cuff Size: Large)   Pulse 88   Ht 5' 5 (1.651 m)   Wt 200 lb (90.7 kg)   LMP 05/01/2024 (Approximate)   BMI 33.28 kg/m     Skin warm and dry.Pelvic: external genitalia is normal in appearance no lesions, vagiHa: pink,urethra has no lesions or masses noted, cervix:bulbous, has small nabothian cyst, pap with high risk hPV, GC/CHL and trich performed, uterus: normal size, shape and contour, non tender, no masses felt, adnexa: no masses or tenderness noted. Bladder is non tender and no masses felt.  She has scabs on right leg, she thinks it is flea bites.   [facility] - 05/12/24 9161       Pregnancy Intention Screening   Does the patient want to become pregnant in the next year? No    Does the patient's partner want to become pregnant in the next year? No    Would the patient like to discuss contraceptive options today? No      Contraception Wrap Up   Current Method Abstinence    End Method Abstinence    Contraception Counseling Provided No         Examination chaperoned by Clarita Salt LPN  Assessment:     1. Routine Papanicolaou smear (Primary) Pap sent Pap in 3 years if normal Physical in 1 year Get mammogram  - Cytology - PAP( [facility])     Plan:     Physical in 1 year

## 2024-05-18 ENCOUNTER — Ambulatory Visit: Payer: Self-pay | Admitting: Adult Health

## 2024-05-18 LAB — CYTOLOGY - PAP
Chlamydia: NEGATIVE
Comment: NEGATIVE
Comment: NEGATIVE
Comment: NEGATIVE
Comment: NORMAL
Diagnosis: NEGATIVE
High risk HPV: NEGATIVE
Neisseria Gonorrhea: NEGATIVE
Trichomonas: NEGATIVE

## 2024-05-24 ENCOUNTER — Encounter: Payer: Self-pay | Admitting: Urology

## 2024-05-25 NOTE — Telephone Encounter (Signed)
 FYI and advise please

## 2024-05-26 NOTE — Telephone Encounter (Signed)
 FYI and advise please

## 2024-05-27 ENCOUNTER — Encounter: Payer: Self-pay | Admitting: Urology

## 2024-05-27 NOTE — Telephone Encounter (Signed)
 FYI and advise

## 2024-05-27 NOTE — Telephone Encounter (Signed)
 FYI

## 2024-05-30 ENCOUNTER — Other Ambulatory Visit: Payer: MEDICAID | Admitting: Urology

## 2024-06-05 ENCOUNTER — Encounter: Payer: Self-pay | Admitting: Urology

## 2024-06-06 NOTE — Telephone Encounter (Signed)
 FYI and advised / pt was advised that all further recommendations/ treatment options can be addressed at her f/u appointment

## 2024-07-18 ENCOUNTER — Other Ambulatory Visit: Payer: MEDICAID | Admitting: Urology

## 2024-07-18 NOTE — Telephone Encounter (Signed)
 Fyi.

## 2024-07-21 NOTE — ED Triage Notes (Signed)
 Pain to right and mid back x 6 months. Bruising and swelling to right and mid back within 2 weeks. Concerned that it is a blood clot.

## 2024-07-22 NOTE — Telephone Encounter (Signed)
 FYI, pt was also notified due to her continuously cancelling appointments she cannot be properly assessed as needed for further treatment recommendations, all questions and concerns can be discussed at her scheduled appointment

## 2024-07-28 ENCOUNTER — Ambulatory Visit: Payer: Self-pay

## 2024-07-28 NOTE — Telephone Encounter (Signed)
 FYI Only or Action Required?: Action required by provider: request for appointment.  Patient was last seen in primary care on .  Called Nurse Triage reporting Mass.  Symptoms began several weeks ago.  Interventions attempted: Nothing.Lump is swollen and red.  Symptoms are: gradually worsening.  Saw dermatology yesterday and pt. Not satisfied. Will go to ED.  Triage Disposition: Go to ED Now (or PCP Triage)  Patient/caregiver understands and will follow disposition?: Yes    Copied from CRM [id number]. Topic: Clinical - Red Word Triage >> Jul 28, 2024  9:20 AM Tiffini S wrote: [facility] that prompted transfer to Nurse Triage: Patient have a large lump on left arm- painful, swollen- worried that area could be from a spider bite and a mass from lymph cancer because of family history- have red streaks  Schedule appointment for transfer of care: Charmaine Grooms Reason for Disposition  Patient sounds very sick or weak to the triager  Answer Assessment - Initial Assessment Questions 1. APPEARANCE of SWELLING: What does it look like?     lump 2. SIZE: How large is the swelling? (e.g., inches, cm; or compare to size of pinhead, tip of pen, eraser, coin, pea, grape, ping pong ball)      Red streaks 3. LOCATION: Where is the swelling located?     Left arm at antecubital 4. ONSET: When did the swelling start?     October 13 5. COLOR: What color is it? Is there more than one color?     red 6. PAIN: Is there any pain? If Yes, ask: How bad is the pain? (Scale 1-10; or mild, moderate, severe)       no 7. ITCH: Does it itch? If Yes, ask: How bad is the itch?      no 8. CAUSE: What do you think caused the swelling?     Unsure, saw Dermatology  9 OTHER SYMPTOMS: Do you have any other symptoms? (e.g., fever)     nausea  Protocols used: Skin Lump or Localized Swelling-A-AH

## 2024-07-30 ENCOUNTER — Encounter: Payer: Self-pay | Admitting: Physician Assistant

## 2024-07-31 ENCOUNTER — Encounter: Payer: Self-pay | Admitting: Physician Assistant

## 2024-08-01 NOTE — Telephone Encounter (Signed)
See other message for response.

## 2024-08-02 ENCOUNTER — Encounter: Payer: Self-pay | Admitting: Physician Assistant

## 2024-08-02 NOTE — Telephone Encounter (Signed)
See other message for response.

## 2024-08-06 ENCOUNTER — Encounter: Payer: Self-pay | Admitting: Emergency Medicine

## 2024-08-06 ENCOUNTER — Ambulatory Visit
Admission: EM | Admit: 2024-08-06 | Discharge: 2024-08-06 | Disposition: A | Discharge status: Home / Self Care | Payer: MEDICAID | Attending: Nurse Practitioner | Admitting: Nurse Practitioner

## 2024-08-06 DIAGNOSIS — R197 Diarrhea, unspecified: Secondary | ICD-10-CM | POA: Diagnosis present

## 2024-08-06 DIAGNOSIS — R319 Hematuria, unspecified: Secondary | ICD-10-CM | POA: Diagnosis present

## 2024-08-06 LAB — POCT URINE DIPSTICK
Bilirubin, UA: NEGATIVE
Glucose, UA: NEGATIVE mg/dL
Ketones, POC UA: NEGATIVE mg/dL
Leukocytes, UA: NEGATIVE
Nitrite, UA: NEGATIVE
POC PROTEIN,UA: NEGATIVE
Spec Grav, UA: 1.01 (ref 1.010–1.025)
Urobilinogen, UA: 0.2 U/dL
pH, UA: 5.5 (ref 5.0–8.0)

## 2024-08-06 MED ORDER — LOPERAMIDE HCL 2 MG PO CAPS: 2.0000 mg | ORAL_CAPSULE | Freq: Four times a day (QID) | ORAL | 0 refills | Status: DC | PRN

## 2024-08-06 NOTE — Discharge Instructions (Addendum)
 Lab results are pending.  You will be contacted if the pending test results are abnormal.  You will also have access to the results via MyChart. Take medication as prescribed. You may take over-the-counter Tylenol  as needed for pain, fever, or general discomfort. Recommend Pedialyte or Gatorade to prevent dehydration. I have provided foods that you can eat to help decrease the diarrhea. Go to the emergency department if you experience fever, chills, abdominal pain, worsening diarrhea, or other concerns. Follow-up as needed.

## 2024-08-06 NOTE — ED Provider Notes (Signed)
 [facility]    CSN: [id number] Arrival date & time: 08/06/24  1036      History   Chief Complaint No chief complaint on file.   HPI Kayla [name] is a 53 y.o. female.   The history is provided by the patient.   Patient presents for a 2-day history of diarrhea and hematuria.  Patient states that she had a CT scan at another hospital and since that time, she has had symptoms.  She denies fever, chills, chest pain, abdominal pain, nausea, vomiting, or rash.  Patient states that she has had more than 20 episodes of diarrhea today.  She denies recent travel, eating any unusual food, or close sick contacts.  Patient is concerned that the IVP dye that was given to her during the CT scan is causing her symptoms, she is requesting blood work today.  Patient has not taken any medications for her symptoms.  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)    Endometrial polyp 09/01/2019   Pt aware, make appt with Dr Edsel,    Fibroids 09/01/2019   Hypertension    Liver disease    Ovarian cyst    cervical polyp   Palpitations    possible bradycardia   Somatization disorder    Vaginal Pap smear, abnormal     Patient Active Problem List   Diagnosis Date Noted   History of autoimmune disorder 08/17/2023   Bipolar disorder, current episode manic severe with psychotic features (HCC) 07/08/2023   Vaginal discharge 06/01/2023   ASCUS of cervix with negative high risk HPV 05/08/2023   Menorrhagia with irregular cycle 05/05/2023   Irregular bleeding 05/05/2023   Genetic testing 01/06/2023   Family history of colon cancer 12/24/2022   Benign skin mole 11/20/2022   Angiokeratoma 11/20/2022   Chronic venous insufficiency 05/23/2020   Swollen leg 05/23/2020   Secondary hypertension 11/03/2019   Dysphagia 09/16/2019   Gastroesophageal reflux disease 09/16/2019   Abdominal pain  09/16/2019   Fibroids 09/01/2019   Endometrial polyp 09/01/2019   Swelling of both ankles 08/24/2019   Abdominal bloating 08/24/2019   Pelvic pain 08/24/2019   Menorrhagia with regular cycle 08/24/2019   Overdose of antipsychotic with dilated pupils and possible dystonia affecting eye muscles 06/21/2012   Hyperglycemia 06/21/2012   Hyponatremia 06/21/2012   QT prolongation with abnormal EKG 06/21/2012   Delusional disorder (HCC) 03/18/2012   Hematoma 12/11/2011   Swelling of joint of pelvic region or thigh 12/11/2011   Laboratory test 12/01/2011   Ovarian cyst    Anxiety    Palpitations     Past Surgical History:  Procedure Laterality Date   None  05/22/2012   hx of ovarian cysts   UPPER GI ENDOSCOPY      OB History     Gravida  3   Para  3   Term  3   Preterm      AB      Living  3      SAB      IAB      Ectopic      Multiple      Live Births               Home Medications    Prior to Admission medications   Medication Sig Start Date End Date Taking? Authorizing Provider  loperamide (IMODIUM) 2 MG capsule Take  1 capsule (2 mg total) by mouth 4 (four) times daily as needed for diarrhea or loose stools. 08/06/24  Yes Leath-Warren, Etta PARAS, NP  diltiazem  (CARDIZEM  CD) 120 MG 24 hr capsule Take 120 mg by mouth. 04/04/24 05/09/25  [provider]  Pediatric Multiple Vitamins (FLINSTONES GUMMIES OMEGA-3 DHA) CHEW Chew by mouth.    [provider]    Family History Family History  Problem Relation Age of Onset   Hypertension Mother    Diabetes Mother    Lymphoma Father 32   Colon cancer Brother 77   Melanoma Maternal Aunt    Lung cancer Maternal Uncle    Bone cancer Paternal Aunt    Lung cancer Paternal Uncle    Brain cancer Maternal Grandfather 32 - 46   Heart failure Other     Social History Social History   Tobacco Use   Smoking status: Never   Smokeless tobacco: Never  Vaping Use   Vaping status: Never Used   Substance Use Topics   Alcohol use: No   Drug use: No     Allergies   Latex   Review of Systems Review of Systems Per HPI  Physical Exam Triage Vital Signs ED Triage Vitals  Encounter Vitals Group     BP 08/06/24 1132 (!) 143/83     Girls Systolic BP Percentile --      Girls Diastolic BP Percentile --      Boys Systolic BP Percentile --      Boys Diastolic BP Percentile --      Pulse Rate 08/06/24 1132 79     Resp 08/06/24 1132 18     Temp 08/06/24 1132 97.9 F (36.6 C)     Temp Source 08/06/24 1132 Oral     SpO2 08/06/24 1132 98 %     Weight --      Height --      Head Circumference --      Peak Flow --      Pain Score 08/06/24 1134 8     Pain Loc --      Pain Education --      Exclude from Growth Chart --    No data found.  Updated Vital Signs BP (!) 143/83 (BP Location: Right Arm)   Pulse 79   Temp 97.9 F (36.6 C) (Oral)   Resp 18   LMP 07/26/2024 (Approximate)   SpO2 98%   Visual Acuity Right Eye Distance:   Left Eye Distance:   Bilateral Distance:    Right Eye Near:   Left Eye Near:    Bilateral Near:     Physical Exam Vitals and nursing note reviewed.  Constitutional:      General: She is not in acute distress.    Appearance: Normal appearance.  Eyes:     Extraocular Movements: Extraocular movements intact.     Pupils: Pupils are equal, round, and reactive to light.  Pulmonary:     Effort: Pulmonary effort is normal. No respiratory distress.     Breath sounds: Normal breath sounds. No stridor. No wheezing, rhonchi or rales.  Abdominal:     General: Bowel sounds are normal.     Palpations: Abdomen is soft.     Tenderness: There is no abdominal tenderness. There is no right CVA tenderness or left CVA tenderness.  Neurological:     Mental Status: She is alert.  Psychiatric:        Attention and Perception: Attention normal.  Mood and Affect: Mood is anxious.        Speech: Speech normal.        Behavior: Behavior is  cooperative.      UC Treatments / Results  Labs (all labs ordered are listed, but only abnormal results are displayed) Labs Reviewed  POCT URINE DIPSTICK - Abnormal; Notable for the following components:      Result Value   Blood, UA small (*)    All other components within normal limits  URINE CULTURE  CBC WITH DIFFERENTIAL/PLATELET  BASIC METABOLIC PANEL WITH GFR    EKG   Radiology No results found.  Procedures Procedures (including critical care time)  Medications Ordered in UC Medications - No data to display  Initial Impression / Assessment and Plan / UC Course  I have reviewed the triage vital signs and the nursing notes.  Pertinent labs & imaging results that were available during my care of the patient were reviewed by me and considered in my medical decision making (see chart for details).  Urine culture is pending, urinalysis did not indicate obvious urinary tract infection.  CBC and BMP are pending for safety and per the patient's request.  Will start patient on Imodium A-D 2 mg for diarrhea.  Supportive care recommendations were provided and discussed with the patient to include fluids, over-the-counter analgesics, and to allow for plenty of rest.  Patient was given strict ER follow-up precautions.  Patient was in agreement with this plan of care and verbalizes understanding.  All questions were answered.  Patient stable for discharge.   Final Clinical Impressions(s) / UC Diagnoses   Final diagnoses:  Hematuria, unspecified type  Diarrhea, unspecified type     Discharge Instructions      Lab results are pending.  You will be contacted if the pending test results are abnormal.  You will also have access to the results via MyChart. Take medication as prescribed. You may take over-the-counter Tylenol  as needed for pain, fever, or general discomfort. Recommend Pedialyte or Gatorade to prevent dehydration. I have provided foods that you can eat to help  decrease the diarrhea. Go to the emergency department if you experience fever, chills, abdominal pain, worsening diarrhea, or other concerns. Follow-up as needed.     ED Prescriptions     Medication Sig Dispense Auth. Provider   loperamide (IMODIUM) 2 MG capsule Take 1 capsule (2 mg total) by mouth 4 (four) times daily as needed for diarrhea or loose stools. 12 capsule Leath-Warren, Etta PARAS, NP      PDMP not reviewed this encounter.   Gilmer Etta PARAS, NP 08/06/24 1223

## 2024-08-06 NOTE — ED Triage Notes (Signed)
 Has been having diarrhea x 2 days.  States she noticed blood in her urine and lower back pain since 10/22.  Had IVP dye on 10/22 and thinks this is what is causing her symptoms.

## 2024-08-07 LAB — CBC WITH DIFFERENTIAL/PLATELET
Basophils Absolute: 0.1 x10E3/uL (ref 0.0–0.2)
Basos: 1 %
EOS (ABSOLUTE): 0 x10E3/uL (ref 0.0–0.4)
Eos: 0 %
Hematocrit: 33.1 % — ABNORMAL LOW (ref 34.0–46.6)
Hemoglobin: 10.3 g/dL — ABNORMAL LOW (ref 11.1–15.9)
Immature Grans (Abs): 0 x10E3/uL (ref 0.0–0.1)
Immature Granulocytes: 0 %
Lymphocytes Absolute: 0.9 x10E3/uL (ref 0.7–3.1)
Lymphs: 16 %
MCH: 26.5 pg — ABNORMAL LOW (ref 26.6–33.0)
MCHC: 31.1 g/dL — ABNORMAL LOW (ref 31.5–35.7)
MCV: 85 fL (ref 79–97)
Monocytes Absolute: 0.3 x10E3/uL (ref 0.1–0.9)
Monocytes: 6 %
Neutrophils Absolute: 4.4 x10E3/uL (ref 1.4–7.0)
Neutrophils: 77 %
Platelets: 263 x10E3/uL (ref 150–450)
RBC: 3.89 x10E6/uL (ref 3.77–5.28)
RDW: 15.8 % — ABNORMAL HIGH (ref 11.7–15.4)
WBC: 5.7 x10E3/uL (ref 3.4–10.8)

## 2024-08-07 LAB — BASIC METABOLIC PANEL WITH GFR
BUN/Creatinine Ratio: 12 (ref 9–23)
BUN: 7 mg/dL (ref 6–24)
CO2: 23 mmol/L (ref 20–29)
Calcium: 9.1 mg/dL (ref 8.7–10.2)
Chloride: 104 mmol/L (ref 96–106)
Creatinine, Ser: 0.58 mg/dL (ref 0.57–1.00)
Glucose: 104 mg/dL — ABNORMAL HIGH (ref 70–99)
Potassium: 4.1 mmol/L (ref 3.5–5.2)
Sodium: 140 mmol/L (ref 134–144)
eGFR: 109 mL/min/1.73 (ref >59)

## 2024-08-07 LAB — URINE CULTURE: Culture: <10000 — AB

## 2024-08-08 ENCOUNTER — Encounter: Payer: Self-pay | Admitting: Physician Assistant

## 2024-08-08 ENCOUNTER — Ambulatory Visit (HOSPITAL_COMMUNITY): Payer: Self-pay

## 2024-08-08 NOTE — Telephone Encounter (Signed)
 Fyi.

## 2024-08-23 ENCOUNTER — Other Ambulatory Visit: Payer: Self-pay | Admitting: Physician Assistant

## 2024-08-23 DIAGNOSIS — K119 Disease of salivary gland, unspecified: Secondary | ICD-10-CM

## 2024-09-01 ENCOUNTER — Encounter: Payer: Self-pay | Admitting: Physician Assistant

## 2024-09-01 ENCOUNTER — Encounter: Payer: Self-pay | Admitting: Thoracic Surgery (Cardiothoracic Vascular Surgery)

## 2024-09-02 ENCOUNTER — Ambulatory Visit: Payer: Self-pay | Admitting: Physician Assistant

## 2024-09-02 ENCOUNTER — Ambulatory Visit: Payer: MEDICAID | Admitting: Physician Assistant

## 2024-09-02 VITALS — BP 125/81 | HR 74 | Temp 97.7°F | Ht 65.0 in | Wt 193.8 lb

## 2024-09-02 DIAGNOSIS — K921 Melena: Secondary | ICD-10-CM

## 2024-09-02 DIAGNOSIS — D490 Neoplasm of unspecified behavior of digestive system: Secondary | ICD-10-CM

## 2024-09-02 DIAGNOSIS — Z7689 Persons encountering health services in other specified circumstances: Secondary | ICD-10-CM

## 2024-09-02 DIAGNOSIS — K119 Disease of salivary gland, unspecified: Secondary | ICD-10-CM

## 2024-09-02 DIAGNOSIS — R9089 Other abnormal findings on diagnostic imaging of central nervous system: Secondary | ICD-10-CM

## 2024-09-02 DIAGNOSIS — F312 Bipolar disorder, current episode manic severe with psychotic features: Secondary | ICD-10-CM
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
## 2024-09-03 ENCOUNTER — Encounter: Payer: Self-pay | Admitting: Physician Assistant

## 2024-09-03 DIAGNOSIS — R9089 Other abnormal findings on diagnostic imaging of central nervous system: Secondary | ICD-10-CM | POA: Insufficient documentation

## 2024-09-03 DIAGNOSIS — K921 Melena: Secondary | ICD-10-CM | POA: Insufficient documentation

## 2024-09-03 NOTE — Assessment & Plan Note (Signed)
 Mass on parotid gland under evaluation by ENT. Imaging scheduled to assess for malignancy. - Continue with scheduled imaging on December 2nd.

## 2024-09-03 NOTE — Assessment & Plan Note (Addendum)
 Chronic symptoms with family history of colon cancer. Scheduled for colonoscopy. Reported diarrhea following meals. Offered medication management such as Bentyl for symptoms, however patient declines today.  - Referred to gastroenterology for evaluation and management after next colonoscopy on December 9th. - Discussed warning symptoms and ER precautions.

## 2024-09-03 NOTE — Assessment & Plan Note (Signed)
 Patient presents today with concerns of abnormal white matter on MRI. In the ER earlier this year patient underwent a brain MRI and was found to have a tiny nonspecific focus of T2 FLAIR hyperintense signal abnormality within the anterior right frontal lobe white matter. She is significantly concerned about this finding, despite adequate reassurance. She relates concern for multiple sclerosis, stroke, or other neurological conditions, specifically brain cancer, due to her strong family history of cancer. She states she was advised to schedule follow-up with neurologist in the ER. No additional symptoms similar to July 2025 symptoms. Endorses headaches. Benign, reassuring exam today.  - Significant amount of time offering reassurance to patient. Discussed potential benign nature of brain MRI findings.  - Referred to neurology for further evaluation and management at patient request.

## 2024-09-03 NOTE — Assessment & Plan Note (Signed)
 Denies need for medication of referral to psychiatry. Continue current management. Monitor for new or worsening symptoms.

## 2024-09-03 NOTE — Assessment & Plan Note (Signed)
 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 Concerns about progression to cancer. - Referral to gastroenterology for continued surveillance and management.

## 2024-09-12 ENCOUNTER — Encounter: Payer: Self-pay | Admitting: Neurology

## 2024-09-13 ENCOUNTER — Inpatient Hospital Stay
Admission: RE | Admit: 2024-09-13 | Discharge: 2024-09-13 | Disposition: A | Payer: MEDICAID | Source: Ambulatory Visit | Attending: Physician Assistant | Admitting: Physician Assistant

## 2024-09-13 ENCOUNTER — Encounter: Payer: Self-pay | Admitting: Physician Assistant

## 2024-09-13 ENCOUNTER — Encounter: Payer: Self-pay | Admitting: Neurology

## 2024-09-13 DIAGNOSIS — K119 Disease of salivary gland, unspecified: Secondary | ICD-10-CM

## 2024-09-14 ENCOUNTER — Ambulatory Visit: Payer: Self-pay | Admitting: *Deleted

## 2024-09-14 NOTE — Telephone Encounter (Signed)
 See NT encounter , please advise for earlier appt this week.   FYI Only or Action Required?: FYI only for provider: appointment scheduled on 09/19/24 and if MRI can be ordered where insurance will allow.  Patient was last seen in primary care on 09/02/2024 by G[place], [place], [place].  Called Nurse Triage reporting Mass.  Symptoms began several months ago.  Interventions attempted: Other: seeing multiple providers .  Symptoms are: gradually worsening.  Triage Disposition: See Physician Within 24 Hours  Patient/caregiver understands and will follow disposition?: Yes                Copied from CRM #[id number]. Topic: Clinical - Red Word Triage >> Sep 14, 2024  8:38 AM Kayla [name] wrote: Red Word that prompted transfer to Nurse Triage: Lump in arm worsening, seeking MRI. PCP has already seen this last week. Has already been to ED in October for this. Worsened. Also feels nauseous. Believes that her insurance will cover this if it comes from her PCP. Reason for Disposition  Looks like a boil, infected sore, deep ulcer or other infected rash  Answer Assessment - Initial Assessment Questions Multiple medication complaints reviewed with patient today. None reported that require ED at this time. Headaches continue not severe no blurred vision no weakness on either side of body no dizziness now. No vomiting now. No chest pain no difficulty breathing . No c/o palpitations or heart racing at this time but reported in the past. Patient very anxious she has not been able to get answers to her multiple medical complaints. Referral appt have been scheduled but not until Jan. Reports lump left arm bigger size of orange. Offered appt tomorrow with other provider patient requesting to see PCP. Please advise if earlier appt with PCP available or if PCP will order MRI to check lump on arm. Offered emotional support to patient. Recommended if worsening sx go to ED for evaluation.   Appt scheduled with  PCP 09/19/24 earliest available     1. APPEARANCE of SWELLING: What does it look like?     Left arm, in bend of arm. Big lump  2. SIZE: How large is the swelling? (e.g., inches, cm; or compare to size of pinhead, tip of pen, eraser, coin, pea, grape, ping pong ball)      Size of orange  3. LOCATION: Where is the swelling located?     Left arm and bend of arm 4. ONSET: When did the swelling start?     October 5. COLOR: What color is it? Is there more than one color?     No redness  6. PAIN: Is there any pain? If Yes, ask: How bad is the pain? (Scale 1-10; or mild, moderate, severe)       No pain  7. ITCH: Does it itch? If Yes, ask: How bad is the itch?      No itching 8. CAUSE: What do you think caused the swelling?     Not sure  9 OTHER SYMPTOMS: Do you have any other symptoms? (e.g., fever)     Headache right side of head dull ache , feels nausea all of the time. Gets cold easy. Frequent BMs. Multiple health issues reported.  Dysphasia, bruising easily. Heart hurts a lot, denies chest pain no heart palpitations reported at this time.  Hx Blood in stool and bleeding from kidneys.  Anxious , very concerned if she has cancer . Feels sx started in October and getting worse.  Protocols used: Skin  Lump or Localized Swelling-A-AH

## 2024-09-19 ENCOUNTER — Encounter: Payer: Self-pay | Admitting: Physician Assistant

## 2024-09-19 ENCOUNTER — Ambulatory Visit: Payer: MEDICAID | Admitting: Physician Assistant

## 2024-09-19 VITALS — BP 138/82 | HR 87 | Temp 97.9°F | Ht 65.0 in | Wt 198.0 lb

## 2024-09-19 DIAGNOSIS — M25429 Effusion, unspecified elbow: Secondary | ICD-10-CM | POA: Insufficient documentation

## 2024-09-19 NOTE — Assessment & Plan Note (Signed)
 Ultrasound in ER in October did not reveal cysts, solid masses, or blood clots, however patient concern persists. No palpable mass on exam today, only dense tissue palpated. No tenderness, decreased ROM, or skin color changes. No malignancy concern. Reassurance offered to patient. However, due to significant health related anxiety, patient wishes to proceed with additional imaging as she feels area of concern was not imaged/visualized in the ED.  - Soft tissue US  of left medial elbow ordered.  - Advised follow-up with sports medicine at [facility]. - Warning signs and ER precautions discussed.

## 2024-09-19 NOTE — Progress Notes (Signed)
 Acute Office Visit  Subjective:     Patient ID: Kayla [name], female    DOB: 08-01-1971, 53 y.o.   MRN: [id number]   Discussed the use of AI scribe software for clinical note transcription with the patient, who gave verbal consent to proceed.  History of Present Illness Barbi Kumagai is a 53 year old female who presents with concerns about a lump on the inside of her left elbow.  She reports a lump on the inside of her left elbow that appeared after a red, itchy circle resolved. The lump is intermittently painful. An ultrasound was done in October in the ER, within normal limits, but she feels the lump itself was not imaged.  A sports medicine physician attempted to order an MRI of the elbow, but it was reportedly denied due to coding, and she was told to work with her family doctor to obtain appropriate imaging.  She recently saw an endocrinologist who did thyroid blood tests and suggested an oncology consultation because of multiple health problems. She is unsure of the reason for this referral and wants clarification.  She reports underarm pain and has avoided mammograms since her thirties due to pain with the prior study.  Her brother died from arrhythmia. She is worried about her heart after seeing MyChart information noting her heart speeding up and slowing down.  She is unemployed and is worried that weather may interfere with getting to scheduled procedures such as her colonoscopy, scheduled for tomorrow.     Review of Systems  Constitutional:  Negative for activity change, appetite change and fever.  Musculoskeletal:  Positive for joint swelling. Negative for arthralgias and myalgias.  Psychiatric/Behavioral:  Negative for agitation. The patient is nervous/anxious and is hyperactive.          Objective:     BP 138/82 (BP Location: Right Arm, Patient Position: Sitting)   Pulse 87   Temp 97.9 F (36.6 C)   Ht 5' 5 (1.651 m)   Wt 198 lb (89.8 kg)   SpO2 95%    BMI 32.95 kg/m   Physical Exam Constitutional:      General: She is not in acute distress.    Appearance: Normal appearance. She is obese. She is not ill-appearing.  HENT:     Head: Normocephalic and atraumatic.     Mouth/Throat:     Mouth: Mucous membranes are moist.     Pharynx: Oropharynx is clear.  Eyes:     Extraocular Movements: Extraocular movements intact.     Conjunctiva/sclera: Conjunctivae normal.  Cardiovascular:     Rate and Rhythm: Normal rate and regular rhythm.     Heart sounds: Normal heart sounds. No murmur heard. Pulmonary:     Effort: Pulmonary effort is normal.     Breath sounds: Normal breath sounds.  Musculoskeletal:     Left elbow: No swelling, deformity or effusion. Normal range of motion. No tenderness.  Skin:    General: Skin is warm and dry.  Neurological:     General: No focal deficit present.     Mental Status: She is alert and oriented to person, place, and time.  Psychiatric:        Mood and Affect: Mood normal.        Behavior: Behavior normal.     No results found for any visits on 09/19/24.      Assessment & Plan:  Soft tissue swelling of elbow joint Assessment & Plan: Ultrasound in ER in October did not reveal cysts, solid  masses, or blood clots, however patient concern persists. No palpable mass on exam today, only dense tissue palpated. No tenderness, decreased ROM, or skin color changes. No malignancy concern. Reassurance offered to patient. However, due to significant health related anxiety, patient wishes to proceed with additional imaging as she feels area of concern was not imaged/visualized in the ED.  - Soft tissue US  of left medial elbow ordered.  - Advised follow-up with sports medicine at [facility]. - Warning signs and ER precautions discussed.    Orders: -     US  SOFT TISSUE UPPER EXTREMITY LIMITED LEFT (NON-VASCULAR)    Return in about 6 weeks (around 11/02/2024) for F/u as scheduled in Jan.  Tekila Caillouet,  PA-C

## 2024-09-30 ENCOUNTER — Ambulatory Visit (HOSPITAL_COMMUNITY)
Admission: RE | Admit: 2024-09-30 | Discharge: 2024-09-30 | Disposition: A | Discharge status: Home / Self Care | Payer: MEDICAID | Source: Ambulatory Visit | Attending: Physician Assistant | Admitting: Physician Assistant

## 2024-09-30 DIAGNOSIS — M25429 Effusion, unspecified elbow: Secondary | ICD-10-CM | POA: Diagnosis present

## 2024-10-10 ENCOUNTER — Ambulatory Visit: Payer: MEDICAID | Admitting: Adult Health

## 2024-10-10 ENCOUNTER — Other Ambulatory Visit: Payer: MEDICAID | Admitting: Urology

## 2024-10-10 DIAGNOSIS — R310 Gross hematuria: Secondary | ICD-10-CM

## 2024-10-14 ENCOUNTER — Encounter: Payer: Self-pay | Admitting: Physician Assistant

## 2024-10-18 ENCOUNTER — Ambulatory Visit: Payer: Self-pay | Admitting: Physician Assistant

## 2024-10-31 ENCOUNTER — Ambulatory Visit: Payer: MEDICAID | Admitting: Physician Assistant

## 2024-11-02 ENCOUNTER — Ambulatory Visit: Payer: MEDICAID | Admitting: Physician Assistant

## 2024-11-09 NOTE — Progress Notes (Unsigned)
 "  NEUROLOGY CONSULTATION NOTE  Pascale Maves MRN: [id number] DOB: 18-Nov-1970  Referring provider: Charmaine Pontiff, PA-C Primary care provider: Charmaine Pontiff, PA-C   Reason for consult:  abnormal brain MRI, headaches  Assessment/Plan:   ***   Subjective:  Chaitra Mast is a 54 year old with HTN, Bipolar disorder, anxiety and somatization disorder who presents for headache and abnormal brain MRI.  History supplemented by ED and referring provider's notes.  In June 2025, she began experiencing headache and blurred vision.  ***.  She was evaluated in the ED on 04/13/2024.  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    Past NSAIDS/analgesics:  *** Past abortive triptans:  *** Past abortive ergotamine:  *** Past muscle relaxants:  *** Past anti-emetic:  ondansetron  Past antihypertensive medications:  propranolol , metoprolol Past antidepressant/antipsychotic medications:  trazodone , risperidone , mirtazapine , haloperidol , citalopram , bupropion, quetiapine  Past anticonvulsant medications:  *** Past anti-CGRP:  *** Past vitamins/Herbal/Supplements:  *** Past antihistamines/decongestants:  Xyzal , Flonase  Other past therapies:  ***  Current NSAIDS/analgesics:  *** Current triptans:  *** Current ergotamine:  *** Current anti-emetic:  *** Current muscle relaxants:  *** Current Antihypertensive medications:  diltiazem  Current Antidepressant/antipsychotic medications:  *** Current Anticonvulsant medications:  *** Current anti-CGRP:  *** Current Vitamins/Herbal/Supplements:  *** Current Antihistamines/Decongestants:  *** Other therapy:  *** Birth control:  *** Other medications:  ***   Caffeine:  *** Alcohol:  *** Smoker:  *** Diet:  *** Exercise:  *** Depression:  ***; Anxiety:   *** Other pain:  *** Sleep hygiene:  ***  History of TBI/concussion:  *** Family history of headache:  *** Family history of cerebral aneurysm:  ***      PAST MEDICAL HISTORY: 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)    Endometrial polyp 09/01/2019   Pt aware, make appt with Dr Edsel,    Fibroids 09/01/2019   Hypertension    Liver disease    Ovarian cyst    cervical polyp   Palpitations    possible bradycardia   Somatization disorder    Vaginal Pap smear, abnormal     PAST SURGICAL HISTORY: Past Surgical History:  Procedure Laterality Date   None  05/22/2012   hx of ovarian cysts   UPPER GI ENDOSCOPY      MEDICATIONS: Medications Ordered Prior to Encounter[1]  ALLERGIES: Allergies[2]  FAMILY HISTORY: Family History  Problem Relation Age of Onset   Hypertension Mother    Diabetes Mother    Lymphoma Father 38   Colon cancer Brother 52   Melanoma Maternal Aunt    Lung cancer Maternal Uncle    Bone cancer Paternal Aunt    Lung cancer Paternal Uncle    Brain cancer Maternal Grandfather 40 - 46   Heart failure Other     Objective:  *** General: No acute distress.  Patient appears well-groomed.   Head:  Normocephalic/atraumatic Eyes:  fundi examined but not visualized Neck: supple, no paraspinal tenderness, full range of motion Heart: regular rate and rhythm Neurological Exam: Mental status: alert and oriented to person, place, and time, speech fluent and not dysarthric, language intact. Cranial nerves: CN I: not tested CN II: pupils equal, round and reactive to light, visual  fields intact CN III, IV, VI:  full range of motion, no nystagmus, no ptosis CN V: facial sensation intact. CN VII: upper and lower face symmetric CN VIII: hearing intact CN IX, X: gag intact, uvula midline CN XI: sternocleidomastoid and trapezius muscles intact CN XII: tongue  midline Bulk & Tone: normal, no fasciculations. Motor:  muscle strength 5/5 throughout Sensation:  Pinprick and vibratory sensation intact. Deep Tendon Reflexes:  2+ throughout,  toes downgoing.   Finger to nose testing:  Without dysmetria.   Gait:  Normal station and stride.  Romberg negative.    Juliene Dunnings, DO  CC: ***        [1]  Current Outpatient Medications on File Prior to Visit  Medication Sig Dispense Refill   diltiazem  (CARDIZEM  CD) 120 MG 24 hr capsule Take 120 mg by mouth.     Pediatric Multiple Vitamins (FLINSTONES GUMMIES OMEGA-3 DHA) CHEW Chew by mouth.     No current facility-administered medications on file prior to visit.  [2]  Allergies Allergen Reactions   Latex Itching and Rash   "

## 2024-11-10 ENCOUNTER — Ambulatory Visit: Payer: MEDICAID | Admitting: Neurology

## 2024-11-18 ENCOUNTER — Ambulatory Visit: Payer: MEDICAID | Admitting: Physician Assistant

## 2024-11-18 ENCOUNTER — Encounter: Payer: Self-pay | Admitting: Physician Assistant

## 2024-11-28 ENCOUNTER — Ambulatory Visit: Payer: MEDICAID | Admitting: Urology

## 2024-12-06 ENCOUNTER — Ambulatory Visit: Payer: MEDICAID | Admitting: Physician Assistant

## 2025-02-20 ENCOUNTER — Ambulatory Visit: Payer: Self-pay | Admitting: Neurology
